# Patient Record
Sex: Male | Born: 1955 | Race: Black or African American | Hispanic: No | Marital: Single | Smoking: Never smoker
Health system: Southern US, Community
[De-identification: ages and names within clinical notes are randomized; demographics above are authoritative.]

## PROBLEM LIST (undated history)

## (undated) DIAGNOSIS — I10 Essential (primary) hypertension: Secondary | ICD-10-CM

## (undated) DIAGNOSIS — I480 Paroxysmal atrial fibrillation: Secondary | ICD-10-CM

## (undated) DIAGNOSIS — C9590 Leukemia, unspecified not having achieved remission: Secondary | ICD-10-CM

---

## 2018-04-27 ENCOUNTER — Emergency Department (HOSPITAL_BASED_OUTPATIENT_CLINIC_OR_DEPARTMENT_OTHER): Payer: BLUE CROSS/BLUE SHIELD

## 2018-04-27 ENCOUNTER — Inpatient Hospital Stay (HOSPITAL_BASED_OUTPATIENT_CLINIC_OR_DEPARTMENT_OTHER)
Admission: EM | Admit: 2018-04-27 | Discharge: 2018-04-29 | DRG: 291 | Disposition: A | Discharge status: Home / Self Care | Payer: BLUE CROSS/BLUE SHIELD | Attending: Internal Medicine | Admitting: Internal Medicine

## 2018-04-27 ENCOUNTER — Other Ambulatory Visit: Payer: Self-pay

## 2018-04-27 ENCOUNTER — Encounter (HOSPITAL_BASED_OUTPATIENT_CLINIC_OR_DEPARTMENT_OTHER): Payer: Self-pay | Admitting: *Deleted

## 2018-04-27 DIAGNOSIS — C9201 Acute myeloblastic leukemia, in remission: Secondary | ICD-10-CM | POA: Diagnosis present

## 2018-04-27 DIAGNOSIS — I480 Paroxysmal atrial fibrillation: Secondary | ICD-10-CM | POA: Diagnosis present

## 2018-04-27 DIAGNOSIS — I427 Cardiomyopathy due to drug and external agent: Secondary | ICD-10-CM | POA: Diagnosis present

## 2018-04-27 DIAGNOSIS — I5021 Acute systolic (congestive) heart failure: Secondary | ICD-10-CM | POA: Diagnosis present

## 2018-04-27 DIAGNOSIS — I471 Supraventricular tachycardia: Secondary | ICD-10-CM | POA: Diagnosis not present

## 2018-04-27 DIAGNOSIS — I110 Hypertensive heart disease with heart failure: Principal | ICD-10-CM | POA: Diagnosis present

## 2018-04-27 DIAGNOSIS — I340 Nonrheumatic mitral (valve) insufficiency: Secondary | ICD-10-CM | POA: Diagnosis not present

## 2018-04-27 DIAGNOSIS — Z8249 Family history of ischemic heart disease and other diseases of the circulatory system: Secondary | ICD-10-CM

## 2018-04-27 DIAGNOSIS — T451X5A Adverse effect of antineoplastic and immunosuppressive drugs, initial encounter: Secondary | ICD-10-CM | POA: Diagnosis present

## 2018-04-27 DIAGNOSIS — T502X5A Adverse effect of carbonic-anhydrase inhibitors, benzothiadiazides and other diuretics, initial encounter: Secondary | ICD-10-CM | POA: Diagnosis present

## 2018-04-27 DIAGNOSIS — I5022 Chronic systolic (congestive) heart failure: Secondary | ICD-10-CM

## 2018-04-27 DIAGNOSIS — J181 Lobar pneumonia, unspecified organism: Secondary | ICD-10-CM | POA: Diagnosis present

## 2018-04-27 DIAGNOSIS — I509 Heart failure, unspecified: Secondary | ICD-10-CM

## 2018-04-27 DIAGNOSIS — I491 Atrial premature depolarization: Secondary | ICD-10-CM | POA: Diagnosis present

## 2018-04-27 DIAGNOSIS — I5023 Acute on chronic systolic (congestive) heart failure: Secondary | ICD-10-CM | POA: Diagnosis present

## 2018-04-27 DIAGNOSIS — C9200 Acute myeloblastic leukemia, not having achieved remission: Secondary | ICD-10-CM | POA: Diagnosis present

## 2018-04-27 DIAGNOSIS — Z79899 Other long term (current) drug therapy: Secondary | ICD-10-CM | POA: Diagnosis not present

## 2018-04-27 DIAGNOSIS — J189 Pneumonia, unspecified organism: Secondary | ICD-10-CM

## 2018-04-27 DIAGNOSIS — E876 Hypokalemia: Secondary | ICD-10-CM | POA: Diagnosis present

## 2018-04-27 HISTORY — DX: Essential (primary) hypertension: I10

## 2018-04-27 HISTORY — DX: Paroxysmal atrial fibrillation: I48.0

## 2018-04-27 HISTORY — DX: Leukemia, unspecified not having achieved remission: C95.90

## 2018-04-27 LAB — URINALYSIS, ROUTINE W REFLEX MICROSCOPIC
Bilirubin Urine: NEGATIVE
GLUCOSE, UA: NEGATIVE mg/dL
KETONES UR: NEGATIVE mg/dL
LEUKOCYTES UA: NEGATIVE
Nitrite: NEGATIVE
Protein, ur: NEGATIVE mg/dL
Specific Gravity, Urine: 1.015 (ref 1.005–1.030)
pH: 6.5 (ref 5.0–8.0)

## 2018-04-27 LAB — URINALYSIS, MICROSCOPIC (REFLEX): WBC, UA: NONE SEEN WBC/hpf (ref 0–5)

## 2018-04-27 LAB — CBC WITH DIFFERENTIAL/PLATELET
BASOS ABS: 0 10*3/uL (ref 0.0–0.1)
Basophils Relative: 0 %
EOS ABS: 0.1 10*3/uL (ref 0.0–0.7)
EOS PCT: 2 %
HCT: 30.3 % — ABNORMAL LOW (ref 39.0–52.0)
Hemoglobin: 10.1 g/dL — ABNORMAL LOW (ref 13.0–17.0)
LYMPHS PCT: 23 %
Lymphs Abs: 1.2 10*3/uL (ref 0.7–4.0)
MCH: 30.7 pg (ref 26.0–34.0)
MCHC: 33.3 g/dL (ref 30.0–36.0)
MCV: 92.1 fL (ref 78.0–100.0)
MONO ABS: 0.8 10*3/uL (ref 0.1–1.0)
Monocytes Relative: 14 %
Neutro Abs: 3.2 10*3/uL (ref 1.7–7.7)
Neutrophils Relative %: 61 %
Platelets: 179 10*3/uL (ref 150–400)
RBC: 3.29 MIL/uL — AB (ref 4.22–5.81)
RDW: 17.4 % — AB (ref 11.5–15.5)
WBC: 5.2 10*3/uL (ref 4.0–10.5)

## 2018-04-27 LAB — COMPREHENSIVE METABOLIC PANEL
ALK PHOS: 95 U/L (ref 38–126)
ALT: 20 U/L (ref 17–63)
ANION GAP: 11 (ref 5–15)
AST: 21 U/L (ref 15–41)
Albumin: 3.5 g/dL (ref 3.5–5.0)
BILIRUBIN TOTAL: 1.2 mg/dL (ref 0.3–1.2)
BUN: 15 mg/dL (ref 6–20)
CALCIUM: 8.4 mg/dL — AB (ref 8.9–10.3)
CO2: 26 mmol/L (ref 22–32)
Chloride: 102 mmol/L (ref 101–111)
Creatinine, Ser: 0.91 mg/dL (ref 0.61–1.24)
Glucose, Bld: 101 mg/dL — ABNORMAL HIGH (ref 65–99)
POTASSIUM: 2.8 mmol/L — AB (ref 3.5–5.1)
Sodium: 139 mmol/L (ref 135–145)
TOTAL PROTEIN: 7.1 g/dL (ref 6.5–8.1)

## 2018-04-27 LAB — I-STAT CG4 LACTIC ACID, ED: Lactic Acid, Venous: 2.03 mmol/L (ref 0.5–1.9)

## 2018-04-27 LAB — TROPONIN I: TROPONIN I: 0.05 ng/mL — AB (ref <0.03)

## 2018-04-27 LAB — D-DIMER, QUANTITATIVE (NOT AT ARMC): D DIMER QUANT: 2.86 ug{FEU}/mL — AB (ref 0.00–0.50)

## 2018-04-27 LAB — BRAIN NATRIURETIC PEPTIDE: B Natriuretic Peptide: 1976.9 pg/mL — ABNORMAL HIGH (ref 0.0–100.0)

## 2018-04-27 LAB — LIPASE, BLOOD: LIPASE: 24 U/L (ref 11–51)

## 2018-04-27 MED ORDER — MAGNESIUM OXIDE 400 (241.3 MG) MG PO TABS
400.0000 mg | ORAL_TABLET | Freq: Three times a day (TID) | ORAL | Status: DC
  Administered 2018-04-27 – 2018-04-29 (×5): 400 mg via ORAL
  Filled 2018-04-27 (×5): qty 1

## 2018-04-27 MED ORDER — VANCOMYCIN HCL IN DEXTROSE 750-5 MG/150ML-% IV SOLN
750.0000 mg | Freq: Three times a day (TID) | INTRAVENOUS | Status: DC
  Administered 2018-04-27 – 2018-04-28 (×2): 750 mg via INTRAVENOUS
  Filled 2018-04-27 (×3): qty 150

## 2018-04-27 MED ORDER — POTASSIUM CHLORIDE CRYS ER 20 MEQ PO TBCR
40.0000 meq | EXTENDED_RELEASE_TABLET | Freq: Once | ORAL | Status: AC
  Administered 2018-04-27: 40 meq via ORAL
  Filled 2018-04-27: qty 2

## 2018-04-27 MED ORDER — ONDANSETRON HCL 4 MG/2ML IJ SOLN: 4.0000 mg | Freq: Four times a day (QID) | INTRAMUSCULAR | Status: DC | PRN

## 2018-04-27 MED ORDER — LISINOPRIL 5 MG PO TABS: 5.0000 mg | ORAL_TABLET | Freq: Every day | ORAL | Status: DC

## 2018-04-27 MED ORDER — IOPAMIDOL (ISOVUE-370) INJECTION 76%
100.0000 mL | Freq: Once | INTRAVENOUS | Status: AC | PRN
  Administered 2018-04-27: 100 mL via INTRAVENOUS

## 2018-04-27 MED ORDER — ONDANSETRON HCL 4 MG PO TABS: 4.0000 mg | ORAL_TABLET | Freq: Four times a day (QID) | ORAL | Status: DC | PRN

## 2018-04-27 MED ORDER — POTASSIUM CHLORIDE 20 MEQ/15ML (10%) PO SOLN
60.0000 meq | Freq: Once | ORAL | Status: AC
  Administered 2018-04-27: 60 meq via ORAL
  Filled 2018-04-27: qty 45

## 2018-04-27 MED ORDER — DRONABINOL 2.5 MG PO CAPS
2.5000 mg | ORAL_CAPSULE | Freq: Three times a day (TID) | ORAL | Status: DC
  Administered 2018-04-28 – 2018-04-29 (×4): 2.5 mg via ORAL
  Filled 2018-04-27 (×4): qty 1

## 2018-04-27 MED ORDER — FLUCONAZOLE 100 MG PO TABS
200.0000 mg | ORAL_TABLET | Freq: Every day | ORAL | Status: DC
  Administered 2018-04-28 – 2018-04-29 (×2): 200 mg via ORAL
  Filled 2018-04-27 (×2): qty 2

## 2018-04-27 MED ORDER — POTASSIUM CHLORIDE CRYS ER 20 MEQ PO TBCR: 30.0000 meq | EXTENDED_RELEASE_TABLET | Freq: Three times a day (TID) | ORAL | Status: DC

## 2018-04-27 MED ORDER — SODIUM CHLORIDE 0.9 % IV SOLN
2.0000 g | Freq: Once | INTRAVENOUS | Status: AC
  Administered 2018-04-27: 2 g via INTRAVENOUS

## 2018-04-27 MED ORDER — FAMOTIDINE 20 MG PO TABS
20.0000 mg | ORAL_TABLET | Freq: Two times a day (BID) | ORAL | Status: DC
  Administered 2018-04-27 – 2018-04-29 (×4): 20 mg via ORAL
  Filled 2018-04-27 (×4): qty 1

## 2018-04-27 MED ORDER — SODIUM CHLORIDE 0.9 % IV SOLN
2.0000 g | Freq: Two times a day (BID) | INTRAVENOUS | Status: DC
  Administered 2018-04-28 – 2018-04-29 (×3): 2 g via INTRAVENOUS
  Filled 2018-04-27 (×4): qty 2

## 2018-04-27 MED ORDER — ACETAMINOPHEN 650 MG RE SUPP: 650.0000 mg | Freq: Four times a day (QID) | RECTAL | Status: DC | PRN

## 2018-04-27 MED ORDER — FUROSEMIDE 10 MG/ML IJ SOLN
60.0000 mg | Freq: Once | INTRAMUSCULAR | Status: AC
  Administered 2018-04-27: 60 mg via INTRAVENOUS
  Filled 2018-04-27: qty 6

## 2018-04-27 MED ORDER — CEFEPIME HCL 2 G IJ SOLR
INTRAMUSCULAR | Status: AC
  Filled 2018-04-27: qty 2

## 2018-04-27 MED ORDER — FUROSEMIDE 10 MG/ML IJ SOLN
40.0000 mg | Freq: Two times a day (BID) | INTRAMUSCULAR | Status: DC
  Administered 2018-04-28 – 2018-04-29 (×3): 40 mg via INTRAVENOUS
  Filled 2018-04-27 (×3): qty 4

## 2018-04-27 MED ORDER — VANCOMYCIN HCL IN DEXTROSE 1-5 GM/200ML-% IV SOLN
1000.0000 mg | Freq: Once | INTRAVENOUS | Status: AC
  Administered 2018-04-27: 1000 mg via INTRAVENOUS
  Filled 2018-04-27: qty 200

## 2018-04-27 MED ORDER — ACETAMINOPHEN 325 MG PO TABS: 650.0000 mg | ORAL_TABLET | Freq: Four times a day (QID) | ORAL | Status: DC | PRN

## 2018-04-27 MED ORDER — ENOXAPARIN SODIUM 40 MG/0.4ML ~~LOC~~ SOLN
40.0000 mg | Freq: Every day | SUBCUTANEOUS | Status: DC
  Administered 2018-04-27 – 2018-04-28 (×2): 40 mg via SUBCUTANEOUS
  Filled 2018-04-27 (×2): qty 0.4

## 2018-04-27 NOTE — ED Notes (Signed)
Patiwent c/o shortness of breath, O2 sat 90-94.  O2 [place] started at 2 lpm.

## 2018-04-27 NOTE — Progress Notes (Signed)
  Call from Dr. 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  On presentation the patient is in sinus tachycardia.  No hypoxia.  CTA revealed pleural effusion with right lower lobe infiltrates.  Elevated BNP of 1900.  To note patient's cardiologist is in [facility] and oncologist at [facility].  No beds currently available at [facility].  We will admit the patient to telemetry unit at [facility] as inpatient status.

## 2018-04-27 NOTE — H&P (Addendum)
History and Physical    Brendan Ellis PYK:[id number] DOB: 12-29-1955 DOA: 04/27/2018  PCP: Robert Bellow, PA-C  Patient coming from: Home.  Chief Complaint: Shortness of breath.  HPI: Brendan Ellis is a 62 y.o. male with history of acute leukemia in remission had induction therapy in [place] around last December 2018 and is due to get consolidation therapy next week has chemotherapy induced cardiomyopathy with last EF measured in March 2019 was 25 to 30% with severe global hypokinesia presents to the ER with complaints of worsening shortness of breath over the last 3 days.  Patient has known history of cardiomyopathy and is on Lasix spironolactone Coreg and lisinopril.  Patient's diuretics and coreg were discontinued 2 weeks ago due to low normal blood pressure.  Patient also had recent cholecystostomy tube removed after having been diagnosed with cholecystitis.  Over the last 3 days patient's shortness of breath worsened Lasix was restarted.  Patient also has experienced productive cough.  Denies any chest pain.  Shortness of breath is on exertion and lying flat.  Also noticed some swelling in the lower extremity's bilaterally.  ED Course: In the ER at [facility] patient had labs drawn which showed BNP of 1900 with mildly elevated troponin.  EKG was showing sinus tachycardia with PVCs and LVH.  Patient has known history of paroxysmal atrial fibrillation.  Since d-dimer was elevated CT angiogram was done which showed bilateral pleural effusion and possible infiltrates.  Patient was started on empiric antibiotics for possible pneumonia and Lasix 40 mg IV for CHF.  Patient also has severe hypokalemia.  Review of Systems: As per HPI, rest all negative.   Past Medical History:  Diagnosis Date  . Hypertension   . Leukemia ([place])     History reviewed. No pertinent surgical history.   reports that he has never smoked. He has never used smokeless tobacco. He reports that he does not  drink alcohol or use drugs.  No Known Allergies  Family History  Problem Relation Age of Onset  . Hypertension Mother   . Diabetes Mother     Prior to Admission medications   Medication Sig Start Date End Date Taking? Authorizing Provider  acetaminophen (TYLENOL) 500 MG tablet Take 500-1,000 mg by mouth every 6 (six) hours as needed for moderate pain.   Yes [provider]  carvedilol (COREG) 3.125 MG tablet Take 3.125 mg by mouth 2 (two) times daily with a meal. (BP > 120 Take 2 tablets)   Yes [provider]  dronabinol (MARINOL) 2.5 MG capsule Take 2.5 mg by mouth 3 (three) times daily.   Yes [provider]  famotidine (PEPCID) 20 MG tablet Take 20 mg by mouth 2 (two) times daily.    Yes [provider]  furosemide (LASIX) 40 MG tablet Take 40 mg by mouth daily.    Yes [provider]  lisinopril (PRINIVIL,ZESTRIL) 10 MG tablet Take 10 mg by mouth daily as needed (BP >130).    Yes [provider]  magnesium oxide (MAG-OX) 400 MG tablet Take 400 mg by mouth 3 (three) times daily.   Yes [provider]  potassium chloride SA (KLOR-CON M15) 15 MEQ tablet Take 30 mEq by mouth 3 (three) times daily.   Yes [provider]  fluconazole (DIFLUCAN) 200 MG tablet Take 200 mg by mouth.  04/03/18   [provider]  spironolactone (ALDACTONE) 25 MG tablet Take 25 mg by mouth daily as needed (fluid retention).  03/26/18  [provider]    Physical Exam: Vitals:   04/27/18 1800 04/27/18 1830 04/27/18 2040 04/27/18 2226  BP: (!) 125/112 (!) 133/95 (!) 142/109 114/90  Pulse:  99 (!) 53 63  Resp: (!) 31 18 20 20   Temp:   97.9 F (36.6 C)   TempSrc:   Oral   SpO2:  100% 100% 100%  Weight:      Height:          Constitutional: Moderately built and nourished. Vitals:   04/27/18 1800 04/27/18 1830 04/27/18 2040 04/27/18 2226  BP: (!) 125/112 (!) 133/95 (!) 142/109 114/90  Pulse:  99 (!) 53 63  Resp:  (!) 31 18 20 20   Temp:   97.9 F (36.6 C)   TempSrc:   Oral   SpO2:  100% 100% 100%  Weight:      Height:       Eyes: Anicteric no pallor. ENMT: No discharge from the ears eyes nose or mouth. Neck: JVD elevated no mass felt. Respiratory: No rhonchi mild crepitations. Cardiovascular: S1-S2 heard no murmurs appreciated. Abdomen: Soft nontender bowel sounds present. Musculoskeletal: Mild edema of the both lower extremities. Skin: No rash. Neurologic: Alert awake oriented to time place and person.  Moves all extremities. Psychiatric: Appears normal.  Normal affect.   Labs on Admission: I have personally reviewed following labs and imaging studies  CBC: Recent Labs  Lab 04/27/18 1510  WBC 5.2  NEUTROABS 3.2  HGB 10.1*  HCT 30.3*  MCV 92.1  PLT 563   Basic Metabolic Panel: Recent Labs  Lab 04/27/18 1510  NA 139  K 2.8*  CL 102  CO2 26  GLUCOSE 101*  BUN 15  CREATININE 0.91  CALCIUM 8.4*   GFR: Estimated Creatinine Clearance: 92.6 mL/min (by C-G formula based on SCr of 0.91 mg/dL). Liver Function Tests: Recent Labs  Lab 04/27/18 1510  AST 21  ALT 20  ALKPHOS 95  BILITOT 1.2  PROT 7.1  ALBUMIN 3.5   Recent Labs  Lab 04/27/18 1510  LIPASE 24   No results for input(s): AMMONIA in the last 168 hours. Coagulation Profile: No results for input(s): INR, PROTIME in the last 168 hours. Cardiac Enzymes: Recent Labs  Lab 04/27/18 1510  TROPONINI 0.05*   BNP (last 3 results) No results for input(s): PROBNP in the last 8760 hours. HbA1C: No results for input(s): HGBA1C in the last 72 hours. CBG: No results for input(s): GLUCAP in the last 168 hours. Lipid Profile: No results for input(s): CHOL, HDL, LDLCALC, TRIG, CHOLHDL, LDLDIRECT in the last 72 hours. Thyroid Function Tests: No results for input(s): TSH, T4TOTAL, FREET4, T3FREE, THYROIDAB in the last 72 hours. Anemia Panel: No results for input(s): VITAMINB12, FOLATE, FERRITIN, TIBC, IRON, RETICCTPCT  in the last 72 hours. Urine analysis:    Component Value Date/Time   COLORURINE YELLOW 04/27/2018 1552   APPEARANCEUR CLEAR 04/27/2018 1552   LABSPEC 1.015 04/27/2018 1552   PHURINE 6.5 04/27/2018 1552   GLUCOSEU NEGATIVE 04/27/2018 1552   HGBUR SMALL (A) 04/27/2018 1552   BILIRUBINUR NEGATIVE 04/27/2018 1552   KETONESUR NEGATIVE 04/27/2018 1552   PROTEINUR NEGATIVE 04/27/2018 1552   NITRITE NEGATIVE 04/27/2018 1552   LEUKOCYTESUR NEGATIVE 04/27/2018 1552   Sepsis Labs: @LABRCNTIP (procalcitonin:4,lacticidven:4) )No results found for this or any previous visit (from the past 240 hour(s)).   Radiological Exams on Admission: Dg Chest 2 View  Result Date: 04/27/2018 CLINICAL DATA:  Cough and congestion with shortness of breath. History of leukemia EXAM:  CHEST - 2 VIEW COMPARISON:  None. FINDINGS: There is airspace consolidation in the left lower lobe with small left pleural effusion. There is more hazy opacity throughout portions of the right middle and lower lobes. Suspect mild loculated effusion on the right. There is cardiomegaly with pulmonary vascularity normal. No adenopathy. No evident bone lesions. IMPRESSION: Multifocal pneumonia. Small left pleural effusion. There may be mild loculated effusion on the right. There is cardiomegaly.  No adenopathy evident. Electronically Signed   By: Lowella Grip III M.D.   On: 04/27/2018 15:30   Ct Angio Chest Pe W And/or Wo Contrast  Result Date: 04/27/2018 CLINICAL DATA:  History of leukemia with shortness of breath, initial encounter EXAM: CT ANGIOGRAPHY CHEST WITH CONTRAST TECHNIQUE: Multidetector CT imaging of the chest was performed using the standard protocol during bolus administration of intravenous contrast. Multiplanar CT image reconstructions and MIPs were obtained to evaluate the vascular anatomy. CONTRAST:  163mL ISOVUE-370 IOPAMIDOL (ISOVUE-370) INJECTION 76% COMPARISON:  Plain film from earlier in the same day. FINDINGS:  Cardiovascular: Thoracic aorta demonstrates atherosclerotic calcifications without aneurysmal dilatation or dissection. The pulmonary artery shows a normal branching pattern. No filling defects to suggest pulmonary embolism are seen. Mild cardiomegaly is noted. Mild coronary calcifications are seen. Mediastinum/Nodes: Thoracic inlet is within normal limits. No significant hilar or mediastinal adenopathy is noted. The esophagus as visualized is within normal limits. Lungs/Pleura: Bilateral pleural effusions are noted left slightly greater than right. Some associated right lower lobe infiltrate is noted. Fluid is also noted within the major fissure. No focal nodularity is noted. Upper Abdomen: Within normal limits. Musculoskeletal: Within normal limits. Review of the MIP images confirms the above findings. IMPRESSION: No evidence of pulmonary emboli. Bilateral pleural effusions are noted left greater than right with evidence of right lower lobe infiltrate. Aortic Atherosclerosis (ICD10-I70.0). Electronically Signed   By: Inez Catalina M.D.   On: 04/27/2018 17:11    EKG: Independently reviewed.  Sinus tachycardia with PVCs and LVH.  Assessment/Plan Principal Problem:   Acute on chronic systolic congestive heart failure (HCC) Active Problems:   Acute exacerbation of CHF (congestive heart failure) ([place])   Community acquired pneumonia of right lower lobe of lung (HCC)   AML (acute myeloblastic leukemia) (HCC)   PAF (paroxysmal atrial fibrillation) (HCC)   Acute systolic CHF (congestive heart failure) ([place])    1. Acute on chronic systolic heart failure last EF measured was 25 to 30% with global hypokinesia last March 3 months ago -patient is on Lasix 40 mg IV every 12.  I have decreased patient's lisinopril dose from 10-5 to accommodate for addition of Coreg eventually once patient is euvolemic.  Closely follow intake output metabolic panel daily weights cycle cardiac markers. 2. Possible pneumonia on  vancomycin and cefepime.  Follow procalcitonin levels.  Sputum cultures if patient is able to produce. 3. Paroxysmal atrial fibrillation -during the stay patient has paroxysmal episodes of A. fib with RVR and goes back to sinus tachycardia.  Closely monitor in telemetry.  May need to start Coreg.  Not sure why patient was not started on any anticoagulation.  Probably will require but may need to contact patient's cardiologist in the morning.  Check TSH we will keep patient on aspirin. 4. Normocytic normochromic anemia likely related to patient's AML. 5. History of AML consolidation therapy to be started next week. 6. During the month of February 2019 patient was briefly on dialysis.   DVT prophylaxis: Lovenox. Code Status: Full code. Family Communication: Patient's daughter.  Disposition Plan: Home. Consults called: None. Admission status: Inpatient.   Rise Patience MD [facility] Pager [phone].  If 7PM-7AM, please contact night-coverage [email or web address] Password [facility]  04/27/2018, 10:29 PM

## 2018-04-27 NOTE — ED Triage Notes (Signed)
Palpitations and SOB. Hx of leukemia.

## 2018-04-27 NOTE — ED Notes (Signed)
Pt transported to CT at this time.

## 2018-04-27 NOTE — ED Notes (Signed)
Pt will require AC IV access of 20G or higher for scheduled CTA of chest per radiology guidelines

## 2018-04-27 NOTE — ED Notes (Signed)
ED Provider at bedside. 

## 2018-04-27 NOTE — ED Notes (Signed)
Pt unable to void at this time. 

## 2018-04-27 NOTE — Progress Notes (Addendum)
Pharmacy Antibiotic Note  Brendan Ellis is a 62 y.o. male admitted on 04/27/2018 with pneumonia.  Pharmacy has been consulted for cefepime and vancomycin dosing.  Plan: - cefepime 2 G every 8 hours - vancomycin 1 G load x 1 - vancomycin 750 mg every 8 hours - goal trough 15-20 mcg/mL - monitor clinical progression, length of therapy, renal function, and vancomycin trough as needed  Height: 6\' 2"  (188 cm) Weight: 167 lb (75.8 kg) IBW/kg (Calculated) : 82.2  Temp (24hrs), Avg:98 F (36.7 C), Min:98 F (36.7 C), Max:98 F (36.7 C)  Recent Labs  Lab 04/27/18 1510  WBC 5.2  CREATININE 0.91    Estimated Creatinine Clearance: 92.6 mL/min (by C-G formula based on SCr of 0.91 mg/dL).    No Known Allergies  Antimicrobials this admission: Cefepime 5/31>>  Vancomycin 5/31 >>   Dose adjustments this admission: N/A  Microbiology results: 5/31 BCx: pending   Thank you for allowing pharmacy to be a part of this patient's care.  Deboraha Sprang, PharmD 04/27/2018 3:48 PM

## 2018-04-27 NOTE — ED Provider Notes (Signed)
Emergency Department Provider Note   I have reviewed the triage vital signs and the nursing notes.   HISTORY  Chief Complaint Palpitations   HPI Brendan Ellis is a 62 y.o. male with PMH of AML s/p induction chemo with remission currently on Azacitidine, Afib, chemotherapy induced cardiomyopathy resents to the emergency department for evaluation of heart palpitations and difficulty breathing.  Symptoms have been worsening over the past several days.  The patient denies any fevers or shaking chills.  He has had a mild cough with no significant productive component.  He denies chest pain has felt a fluttering in his chest.  Patient does have history of atrial fibrillation but is not anticoagulated.  He recently had a early cystostomy tube removed by his general surgeon after treatment has been completed.  He denies any worsening abdominal pain, fevers, drainage from the wound.  He had been on Lasix as an outpatient but this was discontinued for several weeks because of hypotension.  When his breathing symptoms worsened, the decision was made to restart Lasix which she has been taking for the past 3 days.  He does not feel like his legs are more swollen than normal and actually says they may have gone down somewhat.   Past Medical History:  Diagnosis Date  . Hypertension   . Leukemia [facility])     Patient Active Problem List   Diagnosis Date Noted  . Acute exacerbation of CHF (congestive heart failure) ([place]) 04/27/2018  . Acute on chronic systolic congestive heart failure ([place]) 04/27/2018  . Community acquired pneumonia of right lower lobe of lung ([place]) 04/27/2018  . AML (acute myeloblastic leukemia) ([place]) 04/27/2018  . PAF (paroxysmal atrial fibrillation) ([place]) 04/27/2018  . Acute systolic CHF (congestive heart failure) ([place]) 04/27/2018    History reviewed. No pertinent surgical history.    Allergies Patient has no known allergies.  Family History  Problem Relation Age of Onset    . Hypertension Mother   . Diabetes Mother     Social History Social History   Tobacco Use  . Smoking status: Never Smoker  . Smokeless tobacco: Never Used  Substance Use Topics  . Alcohol use: Never    Frequency: Never  . Drug use: Never    Review of Systems  Constitutional: No fever/chills Eyes: No visual changes. ENT: No sore throat. Cardiovascular: Denies chest pain. Positive heart palpitations.  Respiratory: Positive shortness of breath. Gastrointestinal: No abdominal pain.  No nausea, no vomiting.  No diarrhea.  No constipation. Genitourinary: Negative for dysuria. Musculoskeletal: Negative for back pain. Skin: Negative for rash. Neurological: Negative for headaches, focal weakness or numbness.  10-point ROS otherwise negative.  ____________________________________________   PHYSICAL EXAM:  VITAL SIGNS: ED Triage Vitals  Enc Vitals Group     BP 04/27/18 1440 (!) 126/99     Pulse Rate 04/27/18 1440 99     Resp 04/27/18 1440 (!) 28     Temp 04/27/18 1440 98 F (36.7 C)     Temp Source 04/27/18 1440 Oral     SpO2 04/27/18 1440 99 %     Weight 04/27/18 1438 167 lb (75.8 kg)     Height 04/27/18 1438 6\' 2"  (1.88 m)     Pain Score 04/27/18 1437 0   Constitutional: Alert and oriented. Well appearing and in no acute distress. Eyes: Conjunctivae are normal.  Head: Atraumatic. Nose: No congestion/rhinnorhea. Mouth/Throat: Mucous membranes are slightly dry.  Neck: No stridor.   Cardiovascular: Tachycardia. Good peripheral circulation. Grossly  normal heart sounds.   Respiratory: Slight increased respiratory effort.  No retractions. Lungs CTAB. Gastrointestinal: Soft and nontender. No distention.  Musculoskeletal: No lower extremity tenderness nor edema. No gross deformities of extremities. Neurologic:  Normal speech and language. No gross focal neurologic deficits are appreciated.  Skin:  Skin is warm, dry and intact. No rash  noted.  ____________________________________________   LABS (all labs ordered are listed, but only abnormal results are displayed)  Labs Reviewed  COMPREHENSIVE METABOLIC PANEL - Abnormal; Notable for the following components:      Result Value   Potassium 2.8 (*)    Glucose, Bld 101 (*)    Calcium 8.4 (*)    All other components within normal limits  BRAIN NATRIURETIC PEPTIDE - Abnormal; Notable for the following components:   B Natriuretic Peptide 1,976.9 (*)    All other components within normal limits  TROPONIN I - Abnormal; Notable for the following components:   Troponin I 0.05 (*)    All other components within normal limits  CBC WITH DIFFERENTIAL/PLATELET - Abnormal; Notable for the following components:   RBC 3.29 (*)    Hemoglobin 10.1 (*)    HCT 30.3 (*)    RDW 17.4 (*)    All other components within normal limits  URINALYSIS, ROUTINE W REFLEX MICROSCOPIC - Abnormal; Notable for the following components:   Hgb urine dipstick SMALL (*)    All other components within normal limits  D-DIMER, QUANTITATIVE (NOT AT [facility]) - Abnormal; Notable for the following components:   D-Dimer, Quant 2.86 (*)    All other components within normal limits  URINALYSIS, MICROSCOPIC (REFLEX) - Abnormal; Notable for the following components:   Bacteria, UA RARE (*)    All other components within normal limits  BASIC METABOLIC PANEL - Abnormal; Notable for the following components:   Potassium 3.1 (*)    Glucose, Bld 119 (*)    Calcium 8.7 (*)    All other components within normal limits  CBC WITH DIFFERENTIAL/PLATELET - Abnormal; Notable for the following components:   RBC 3.36 (*)    Hemoglobin 10.2 (*)    HCT 31.7 (*)    RDW 18.5 (*)    All other components within normal limits  MAGNESIUM - Abnormal; Notable for the following components:   Magnesium 1.3 (*)    All other components within normal limits  TROPONIN I - Abnormal; Notable for the following components:   Troponin I 0.05  (*)    All other components within normal limits  I-STAT CG4 LACTIC ACID, ED - Abnormal; Notable for the following components:   Lactic Acid, Venous 2.03 (*)    All other components within normal limits  CULTURE, BLOOD (ROUTINE X 2)  CULTURE, BLOOD (ROUTINE X 2)  CULTURE, EXPECTORATED SPUTUM-ASSESSMENT  MRSA PCR SCREENING  LIPASE, BLOOD  TSH  PROCALCITONIN  HIV ANTIBODY (ROUTINE TESTING)  TROPONIN I   ____________________________________________  EKG   EKG Interpretation  Date/Time:  Friday Apr 27 2018 14:40:46 EDT Ventricular Rate:  122 PR Interval:  186 QRS Duration: 96 QT Interval:  350 QTC Calculation: 498 R Axis:   82 Text Interpretation:  Sinus tachycardia with occasional and consecutive Premature ventricular complexes Left ventricular hypertrophy with repolarization abnormality Abnormal ECG No STEMI. No prior for comparison.  Confirmed by Nanda Quinton [phone]) on 04/27/2018 3:01:16 PM       ____________________________________________  RADIOLOGY  Dg Chest 2 View  Result Date: 04/27/2018 CLINICAL DATA:  Cough and congestion with shortness of  breath. History of leukemia EXAM: CHEST - 2 VIEW COMPARISON:  None. FINDINGS: There is airspace consolidation in the left lower lobe with small left pleural effusion. There is more hazy opacity throughout portions of the right middle and lower lobes. Suspect mild loculated effusion on the right. There is cardiomegaly with pulmonary vascularity normal. No adenopathy. No evident bone lesions. IMPRESSION: Multifocal pneumonia. Small left pleural effusion. There may be mild loculated effusion on the right. There is cardiomegaly.  No adenopathy evident. Electronically Signed   By: Lowella Grip III M.D.   On: 04/27/2018 15:30   Ct Angio Chest Pe W And/or Wo Contrast  Result Date: 04/27/2018 CLINICAL DATA:  History of leukemia with shortness of breath, initial encounter EXAM: CT ANGIOGRAPHY CHEST WITH CONTRAST TECHNIQUE: Multidetector  CT imaging of the chest was performed using the standard protocol during bolus administration of intravenous contrast. Multiplanar CT image reconstructions and MIPs were obtained to evaluate the vascular anatomy. CONTRAST:  112mL ISOVUE-370 IOPAMIDOL (ISOVUE-370) INJECTION 76% COMPARISON:  Plain film from earlier in the same day. FINDINGS: Cardiovascular: Thoracic aorta demonstrates atherosclerotic calcifications without aneurysmal dilatation or dissection. The pulmonary artery shows a normal branching pattern. No filling defects to suggest pulmonary embolism are seen. Mild cardiomegaly is noted. Mild coronary calcifications are seen. Mediastinum/Nodes: Thoracic inlet is within normal limits. No significant hilar or mediastinal adenopathy is noted. The esophagus as visualized is within normal limits. Lungs/Pleura: Bilateral pleural effusions are noted left slightly greater than right. Some associated right lower lobe infiltrate is noted. Fluid is also noted within the major fissure. No focal nodularity is noted. Upper Abdomen: Within normal limits. Musculoskeletal: Within normal limits. Review of the MIP images confirms the above findings. IMPRESSION: No evidence of pulmonary emboli. Bilateral pleural effusions are noted left greater than right with evidence of right lower lobe infiltrate. Aortic Atherosclerosis (ICD10-I70.0). Electronically Signed   By: Inez Catalina M.D.   On: 04/27/2018 17:11    ____________________________________________   PROCEDURES  Procedure(s) performed:   .Critical Care Performed by: Margette Fast, MD Authorized by: Margette Fast, MD   Critical care provider statement:    Critical care time (minutes):  35   Critical care time was exclusive of:  Separately billable procedures and treating other patients and teaching time   Critical care was necessary to treat or prevent imminent or life-threatening deterioration of the following conditions:  Respiratory failure and cardiac  failure   Critical care was time spent personally by me on the following activities:  Blood draw for specimens, development of treatment plan with patient or surrogate, evaluation of patient's response to treatment, examination of patient, obtaining history from patient or surrogate, ordering and performing treatments and interventions, ordering and review of laboratory studies, pulse oximetry, ordering and review of radiographic studies, re-evaluation of patient's condition and review of old charts   I assumed direction of critical care for this patient from another provider in my specialty: no       ____________________________________________   INITIAL IMPRESSION / [place] / ED COURSE  Pertinent labs & imaging results that were available during my care of the patient were reviewed by me and considered in my medical decision making (see chart for details).  Emergency department for evaluation of difficulty breathing with associated tachycardia and palpitations.  The patient is currently in remission after being diagnosed with AML.  He is under the care of an oncologist at no font but most of his care including his cardiologist are  at [facility].  He is afebrile here but does have sinus tachycardia and some tachypnea.  Differential is broad and includes PE, infection, fluid overload although clinically patient has no findings to suggest fluid overload.  03:45 PM Patient's x-ray and initial blood work reviewed.  Some concern for possible multifocal pneumonia.  Will follow with CT angios especially in the setting of elevated d-dimer.   CTA negative for PE but shows pleural effusion and infiltrate. Continue diuresis and abx for now. Called [facility] who have no beds for transfer/admission. Will discuss with our hospitalist team. Patient ubdated who is in agreement with the plan.   Discussed patient's case with Hospitalist to request admission. Patient and family (if  present) updated with plan. Care transferred to Hospitalist service.  I reviewed all nursing notes, vitals, pertinent old records, EKGs, labs, imaging (as available).   ____________________________________________  FINAL CLINICAL IMPRESSION(S) / ED DIAGNOSES  Final diagnoses:  Community acquired pneumonia of right lower lobe of lung ([place])  Acute on chronic systolic congestive heart failure ([place])     MEDICATIONS GIVEN DURING THIS VISIT:  Medications  ceFEPIme (MAXIPIME) 2 g in sodium chloride 0.9 % 100 mL IVPB (0 g Intravenous Stopped 04/28/18 0552)  vancomycin (VANCOCIN) IVPB 750 mg/150 ml premix (750 mg Intravenous New Bag/Given 04/28/18 0952)  dronabinol (MARINOL) capsule 2.5 mg (2.5 mg Oral Given 04/28/18 0951)  famotidine (PEPCID) tablet 20 mg (20 mg Oral Given 04/28/18 0950)  fluconazole (DIFLUCAN) tablet 200 mg (200 mg Oral Given 04/28/18 0951)  magnesium oxide (MAG-OX) tablet 400 mg (400 mg Oral Given 04/28/18 0952)  acetaminophen (TYLENOL) tablet 650 mg (has no administration in time range)    Or  acetaminophen (TYLENOL) suppository 650 mg (has no administration in time range)  ondansetron (ZOFRAN) tablet 4 mg (has no administration in time range)    Or  ondansetron (ZOFRAN) injection 4 mg (has no administration in time range)  furosemide (LASIX) injection 40 mg (40 mg Intravenous Given 04/28/18 0522)  enoxaparin (LOVENOX) injection 40 mg (40 mg Subcutaneous Given 04/27/18 2320)  magnesium sulfate IVPB 4 g 100 mL (has no administration in time range)  potassium chloride SA (K-DUR,KLOR-CON) CR tablet 40 mEq (40 mEq Oral Given 04/28/18 0951)  lisinopril (PRINIVIL,ZESTRIL) tablet 2.5 mg (2.5 mg Oral Given 04/28/18 0951)  aspirin chewable tablet 81 mg (81 mg Oral Given 04/28/18 0951)  ceFEPIme (MAXIPIME) 2 g in sodium chloride 0.9 % 100 mL IVPB (0 g Intravenous Stopped 04/27/18 1654)  vancomycin (VANCOCIN) IVPB 1000 mg/200 mL premix (0 mg Intravenous Stopped 04/27/18 1726)  ceFEPIme (MAXIPIME) 2 g  injection (  Return to [facility] 04/27/18 1655)  iopamidol (ISOVUE-370) 76 % injection 100 mL (100 mLs Intravenous Contrast Given 04/27/18 1633)  furosemide (LASIX) injection 60 mg (60 mg Intravenous Given 04/27/18 1729)  potassium chloride 20 MEQ/15ML (10%) solution 60 mEq (60 mEq Oral Given 04/27/18 2111)  potassium chloride SA (K-DUR,KLOR-CON) CR tablet 40 mEq (40 mEq Oral Given 04/27/18 2318)    Note:  This document was prepared using Dragon voice recognition software and may include unintentional dictation errors.  Nanda Quinton, MD Emergency Medicine    Petrea Fredenburg, [facility], MD 04/28/18 1016

## 2018-04-27 NOTE — ED Notes (Signed)
Pt in radiology 

## 2018-04-27 NOTE — ED Notes (Signed)
Pt on cardiac monitor and auto VS 

## 2018-04-28 ENCOUNTER — Encounter (HOSPITAL_COMMUNITY): Payer: Self-pay | Admitting: Internal Medicine

## 2018-04-28 DIAGNOSIS — E876 Hypokalemia: Secondary | ICD-10-CM | POA: Diagnosis present

## 2018-04-28 DIAGNOSIS — J181 Lobar pneumonia, unspecified organism: Secondary | ICD-10-CM

## 2018-04-28 DIAGNOSIS — I5023 Acute on chronic systolic (congestive) heart failure: Secondary | ICD-10-CM

## 2018-04-28 DIAGNOSIS — C9201 Acute myeloblastic leukemia, in remission: Secondary | ICD-10-CM

## 2018-04-28 DIAGNOSIS — I491 Atrial premature depolarization: Secondary | ICD-10-CM | POA: Diagnosis present

## 2018-04-28 LAB — CBC WITH DIFFERENTIAL/PLATELET
BASOS PCT: 1 %
Basophils Absolute: 0 10*3/uL (ref 0.0–0.1)
EOS ABS: 0.1 10*3/uL (ref 0.0–0.7)
EOS PCT: 2 %
HCT: 31.7 % — ABNORMAL LOW (ref 39.0–52.0)
Hemoglobin: 10.2 g/dL — ABNORMAL LOW (ref 13.0–17.0)
LYMPHS ABS: 1.4 10*3/uL (ref 0.7–4.0)
Lymphocytes Relative: 21 %
MCH: 30.4 pg (ref 26.0–34.0)
MCHC: 32.2 g/dL (ref 30.0–36.0)
MCV: 94.3 fL (ref 78.0–100.0)
Monocytes Absolute: 0.9 10*3/uL (ref 0.1–1.0)
Monocytes Relative: 13 %
Neutro Abs: 4.1 10*3/uL (ref 1.7–7.7)
Neutrophils Relative %: 63 %
PLATELETS: 199 10*3/uL (ref 150–400)
RBC: 3.36 MIL/uL — ABNORMAL LOW (ref 4.22–5.81)
RDW: 18.5 % — ABNORMAL HIGH (ref 11.5–15.5)
WBC: 6.5 10*3/uL (ref 4.0–10.5)

## 2018-04-28 LAB — EXPECTORATED SPUTUM ASSESSMENT W GRAM STAIN, RFLX TO RESP C

## 2018-04-28 LAB — BASIC METABOLIC PANEL
ANION GAP: 11 (ref 5–15)
BUN: 16 mg/dL (ref 6–20)
CALCIUM: 8.7 mg/dL — AB (ref 8.9–10.3)
CO2: 28 mmol/L (ref 22–32)
CREATININE: 0.95 mg/dL (ref 0.61–1.24)
Chloride: 104 mmol/L (ref 101–111)
Glucose, Bld: 119 mg/dL — ABNORMAL HIGH (ref 65–99)
Potassium: 3.1 mmol/L — ABNORMAL LOW (ref 3.5–5.1)
SODIUM: 143 mmol/L (ref 135–145)

## 2018-04-28 LAB — TSH: TSH: 1.321 u[IU]/mL (ref 0.350–4.500)

## 2018-04-28 LAB — MRSA PCR SCREENING: MRSA BY PCR: NEGATIVE

## 2018-04-28 LAB — HIV ANTIBODY (ROUTINE TESTING W REFLEX): HIV Screen 4th Generation wRfx: NONREACTIVE

## 2018-04-28 LAB — PROCALCITONIN: Procalcitonin: 0.17 ng/mL

## 2018-04-28 LAB — TROPONIN I: Troponin I: 0.05 ng/mL (ref <0.03)

## 2018-04-28 LAB — MAGNESIUM: Magnesium: 1.3 mg/dL — ABNORMAL LOW (ref 1.7–2.4)

## 2018-04-28 MED ORDER — ASPIRIN 81 MG PO CHEW
81.0000 mg | CHEWABLE_TABLET | Freq: Every day | ORAL | Status: DC
  Administered 2018-04-28 – 2018-04-29 (×2): 81 mg via ORAL
  Filled 2018-04-28 (×2): qty 1

## 2018-04-28 MED ORDER — FUROSEMIDE 10 MG/ML IJ SOLN
60.0000 mg | Freq: Once | INTRAMUSCULAR | Status: AC
  Administered 2018-04-28: 60 mg via INTRAVENOUS

## 2018-04-28 MED ORDER — POTASSIUM CHLORIDE CRYS ER 20 MEQ PO TBCR
40.0000 meq | EXTENDED_RELEASE_TABLET | ORAL | Status: AC
  Administered 2018-04-28 (×2): 40 meq via ORAL
  Filled 2018-04-28 (×2): qty 2

## 2018-04-28 MED ORDER — LISINOPRIL 5 MG PO TABS
2.5000 mg | ORAL_TABLET | Freq: Every day | ORAL | Status: DC
  Administered 2018-04-28 – 2018-04-29 (×2): 2.5 mg via ORAL
  Filled 2018-04-28 (×2): qty 1

## 2018-04-28 MED ORDER — FUROSEMIDE 10 MG/ML IJ SOLN
INTRAMUSCULAR | Status: AC
  Filled 2018-04-28: qty 8

## 2018-04-28 MED ORDER — MAGNESIUM SULFATE 4 GM/100ML IV SOLN
4.0000 g | Freq: Once | INTRAVENOUS | Status: AC
  Administered 2018-04-28: 4 g via INTRAVENOUS
  Filled 2018-04-28: qty 100

## 2018-04-28 MED ORDER — VANCOMYCIN HCL IN DEXTROSE 750-5 MG/150ML-% IV SOLN
750.0000 mg | Freq: Two times a day (BID) | INTRAVENOUS | Status: DC
  Administered 2018-04-28 – 2018-04-29 (×2): 750 mg via INTRAVENOUS
  Filled 2018-04-28 (×2): qty 150

## 2018-04-28 MED ORDER — ASPIRIN 325 MG PO TABS
325.0000 mg | ORAL_TABLET | Freq: Every day | ORAL | Status: DC
Start: 2018-04-28 — End: 2018-04-28

## 2018-04-28 NOTE — Progress Notes (Signed)
PROGRESS NOTE    Brendan Ellis  IAX:[id number] DOB: [birth year] DOA: 04/27/2018 PCP: Robert Bellow, PA-C   Brief Narrative:  Patient 62 year old gentleman being followed by Dr. 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  Due to hypotension patient's diuretics and Coreg were held for about 2 weeks.  Patient presented with worsening shortness of breath despite resuming diuretics 3 days prior to admission.  Chest x-ray which was done consistent with pleural effusions and concern for infiltrate.  CT angiogram chest which was done negative for PE however showed bilateral pleural effusions and possible infiltrates.  Patient started empirically on IV antibiotics and IV diuretics.  Patient also noted to be severely hypokalemic.  Cardiology consulted.   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  Patient presented with worsening shortness of breath.  Patient's ACE inhibitor dose has been decreased to accommodate for diuretics and beta-blocker.  Patient with a urine output of 3.3 L over the past 24 hours.  Some clinical improvement however still with significant orthopnea per patient.  Cardiac enzymes with minimally elevated troponins which seem to have plateaued and likely secondary to acute CHF exacerbation.  2D echo has been ordered and is pending.  Due to patient's  significantly low ejection fraction and history of cardiomyopathy cardiology has been consulted.  Lisinopril dose has been further decreased to 2.5 mg daily to allow for adequate diuresis.  Patient's beta-blocker has been discontinued for now.  Cardiology following and appreciate input and recommendations.  2.  Probable community-acquired pneumonia versus HCAP Patient with a history of AML currently undergoing consolidation therapy to be started next week.  Chest x-ray and CT chest concerning for infiltrates.  Blood cultures have been ordered and are pending.  Sputum Gram stain and culture pending.  Check a MRSA PCR.  Continue empiric IV vancomycin IV cefepime.  If MRSA PCR is negative and continued improvement tomorrow will discontinue IV vancomycin and continue empiric IV cefepime.  Supportive care.  Follow.  3.  Hypokalemia/hypomagnesemia Likely secondary to diuretics.  Replete.  4.  Frequent PACs versus paroxysmal atrial fibrillation Initially was felt patient was going in and out of A. fib and sinus tachycardia.  Replete electrolytes.  Coreg on hold.  Patient has been seen in consultation by cardiology who has reviewed telemetry and feel patient does not have A. fib as he is noted to have P waves preceding each QRS complex on telemetry.  Per cardiology likely try to restart Coreg at a low dose hopefully tomorrow.  No need for anticoagulation at this time as per cardiology patient likely with PACs.  Follow.  5.  Elevated troponin Likely secondary to demand ischemia secondary to problem #1.  By cardiology not ACS.  2D echo ordered and pending.  6 history of AML Patient was supposed to start consolidative therapy next week.  Outpatient follow-up with oncology.  7 normocytic  anemia Likely secondary to AML.  No overt bleeding.  Follow H&H.  8: History of acute kidney injury It is noted per admitting MD that in February 2019 patient was briefly on hemodialysis.  Renal function currently stable.   Follow..   DVT prophylaxis: Lovenox Code Status: Full Family Communication: Updated patient.  No family at bedside. Disposition Plan: Home when medically improved and per cardiology.   Consultants:   Cardiology: Dr. Marlou Porch 04/27/2018  Procedures:   CT angiogram chest 04/27/2018  Chest x-ray 04/27/2018  Antimicrobials:   IV vancomycin 04/27/2018  IV cefepime 04/27/2018   Subjective: Still with complaints of orthopnea.  Feels shortness of breath is slowly improving since admission.  Denies any chest pain.  Objective: Vitals:   04/27/18 2226 04/28/18 0449 04/28/18 1005 04/28/18 1358  BP: 114/90 (!) 128/91 115/85 108/80  Pulse: 63 (!) 42  89  Resp: 20 20  (!) 24  Temp:  97.8 F (36.6 C)  97.6 F (36.4 C)  TempSrc:  Oral  Oral  SpO2: 100% 99%  100%  Weight:  72 kg (158 lb 12.8 oz)    Height:        Intake/Output Summary (Last 24 hours) at 04/28/2018 1714 Last data filed at 04/28/2018 1544 Gross per 24 hour  Intake 960 ml  Output 4775 ml  Net -3815 ml   Filed Weights   04/27/18 1438 04/27/18 2040 04/28/18 0449  Weight: 75.8 kg (167 lb) 73.1 kg (161 lb 1.6 oz) 72 kg (158 lb 12.8 oz)    Examination:  General exam: Appears calm and comfortable  Respiratory system: Bibasilar crackles.  No wheezing.  No rhonchi.  Respiratory effort normal. Cardiovascular system: S1 & S2 heard, RRR. No JVD, murmurs, rubs, gallops or clicks. No pedal edema. Gastrointestinal system: Abdomen is nondistended, soft and nontender. No organomegaly or masses felt. Normal bowel sounds heard. Central nervous system: Alert and oriented. No focal neurological deficits. Extremities: Symmetric 5 x 5 power. Skin: No rashes, lesions or ulcers Psychiatry: Judgement and insight appear normal. Mood & affect appropriate.     Data Reviewed: I have personally reviewed following labs and imaging studies  CBC: Recent Labs  Lab 04/27/18 1510 04/28/18 0517  WBC 5.2 6.5  NEUTROABS 3.2 4.1  HGB 10.1*  10.2*  HCT 30.3* 31.7*  MCV 92.1 94.3  PLT 179 921   Basic Metabolic Panel: Recent Labs  Lab 04/27/18 1510 04/27/18 2314 04/28/18 0517  NA 139  --  143  K 2.8*  --  3.1*  CL 102  --  104  CO2 26  --  28  GLUCOSE 101*  --  119*  BUN 15  --  16  CREATININE 0.91  --  0.95  CALCIUM 8.4*  --  8.7*  MG  --  1.3*  --    GFR: Estimated Creatinine Clearance: 84.2 mL/min (by C-G formula based on SCr of 0.95 mg/dL). Liver Function Tests: Recent Labs  Lab 04/27/18 1510  AST 21  ALT 20  ALKPHOS 95  BILITOT 1.2  PROT 7.1  ALBUMIN 3.5   Recent Labs  Lab 04/27/18 1510  LIPASE 24   No results for input(s): AMMONIA in the last 168 hours. Coagulation Profile: No results for input(s): INR, PROTIME in the last 168 hours. Cardiac Enzymes: Recent Labs  Lab 04/27/18 1510 04/27/18 2314  TROPONINI 0.05* 0.05*   BNP (last 3 results) No results for input(s): PROBNP in the last 8760 hours. HbA1C: No results for input(s): HGBA1C in  the last 72 hours. CBG: No results for input(s): GLUCAP in the last 168 hours. Lipid Profile: No results for input(s): CHOL, HDL, LDLCALC, TRIG, CHOLHDL, LDLDIRECT in the last 72 hours. Thyroid Function Tests: Recent Labs    04/27/18 2314  TSH 1.321   Anemia Panel: No results for input(s): VITAMINB12, FOLATE, FERRITIN, TIBC, IRON, RETICCTPCT in the last 72 hours. Sepsis Labs: Recent Labs  Lab 04/27/18 1606 04/27/18 2314  PROCALCITON  --  0.17  LATICACIDVEN 2.03*  --     Recent Results (from the past 240 hour(s))  Blood Culture (routine x 2)     Status: None (Preliminary result)   Collection Time: 04/27/18  3:50 PM  Result Value Ref Range Status   Specimen Description   Final    BLOOD RIGHT ANTECUBITAL Performed at [facility], [address] [place], [place] [address]    Special Requests   Final    BOTTLES DRAWN AEROBIC AND ANAEROBIC Blood Culture adequate volume Performed at [facility], [address]  [place], [place] [address]    Culture   Final    NO GROWTH < 24 HOURS Performed at [facility], [place] [address], [place], [place] [address]    Report Status PENDING  Incomplete  Blood Culture (routine x 2)     Status: None (Preliminary result)   Collection Time: 04/27/18  3:55 PM  Result Value Ref Range Status   Specimen Description   Final    BLOOD LEFT ANTECUBITAL Performed at [facility], [address] [place], [place] [address]    Special Requests   Final    BOTTLES DRAWN AEROBIC AND ANAEROBIC Blood Culture adequate volume Performed at [facility], [address] [place], [place] [address]    Culture   Final    NO GROWTH < 24 HOURS Performed at [facility], [place] [address], [place], [place] [address]    Report Status PENDING  Incomplete  MRSA PCR Screening     Status: None   Collection Time: 04/28/18  7:52 AM  Result Value Ref Range Status   MRSA by PCR NEGATIVE NEGATIVE Final    Comment:        The GeneXpert MRSA Assay (FDA approved for NASAL specimens only), is one component of a comprehensive MRSA colonization surveillance program. It is not intended to diagnose MRSA infection nor to guide or monitor treatment for MRSA infections. Performed at [facility], [place] [address] [place], [place] [address]          Radiology Studies: Dg Chest 2 View  Result Date: 04/27/2018 CLINICAL DATA:  Cough and congestion with shortness of breath. History of leukemia EXAM: CHEST - 2 VIEW COMPARISON:  None. FINDINGS: There is airspace consolidation in the left lower lobe with small left pleural effusion. There is more hazy opacity throughout portions of the right middle and lower lobes. Suspect mild loculated effusion on the right. There is cardiomegaly with pulmonary vascularity normal. No adenopathy. No evident bone lesions. IMPRESSION: Multifocal pneumonia. Small left pleural effusion. There may be mild loculated effusion on  the right. There is cardiomegaly.  No adenopathy evident. Electronically Signed   By: Lowella Grip III M.D.   On: 04/27/2018 15:30   Ct Angio Chest Pe W And/or Wo Contrast  Result Date: 04/27/2018 CLINICAL DATA:  History of leukemia with shortness of breath, initial encounter EXAM: CT ANGIOGRAPHY CHEST WITH CONTRAST  TECHNIQUE: Multidetector CT imaging of the chest was performed using the standard protocol during bolus administration of intravenous contrast. Multiplanar CT image reconstructions and MIPs were obtained to evaluate the vascular anatomy. CONTRAST:  159mL ISOVUE-370 IOPAMIDOL (ISOVUE-370) INJECTION 76% COMPARISON:  Plain film from earlier in the same day. FINDINGS: Cardiovascular: Thoracic aorta demonstrates atherosclerotic calcifications without aneurysmal dilatation or dissection. The pulmonary artery shows a normal branching pattern. No filling defects to suggest pulmonary embolism are seen. Mild cardiomegaly is noted. Mild coronary calcifications are seen. Mediastinum/Nodes: Thoracic inlet is within normal limits. No significant hilar or mediastinal adenopathy is noted. The esophagus as visualized is within normal limits. Lungs/Pleura: Bilateral pleural effusions are noted left slightly greater than right. Some associated right lower lobe infiltrate is noted. Fluid is also noted within the major fissure. No focal nodularity is noted. Upper Abdomen: Within normal limits. Musculoskeletal: Within normal limits. Review of the MIP images confirms the above findings. IMPRESSION: No evidence of pulmonary emboli. Bilateral pleural effusions are noted left greater than right with evidence of right lower lobe infiltrate. Aortic Atherosclerosis (ICD10-I70.0). Electronically Signed   By: Inez Catalina M.D.   On: 04/27/2018 17:11        Scheduled Meds: . aspirin  81 mg Oral Daily  . dronabinol  2.5 mg Oral TID  . enoxaparin (LOVENOX) injection  40 mg Subcutaneous QHS  . famotidine  20 mg Oral  BID  . fluconazole  200 mg Oral Daily  . furosemide      . furosemide  40 mg Intravenous Q12H  . lisinopril  2.5 mg Oral Daily  . magnesium oxide  400 mg Oral TID   Continuous Infusions: . ceFEPime (MAXIPIME) IV 2 g (04/28/18 1631)  . vancomycin       LOS: 1 day    Time spent: 40 minutes    Irine Seal, MD [facility] Pager [phone]  If 7PM-7AM, please contact night-coverage [email or web address] Password [email or web address] 04/28/2018, 5:14 PM

## 2018-04-28 NOTE — Consult Note (Signed)
Cardiology Consultation:   Patient ID: Brendan Ellis; [id number]; [birth year]   Admit date: 04/27/2018 Date of Consult: 04/28/2018  Primary Care Provider: Robert Bellow, PA-C Primary Cardiologist: Dr. Mahala [facility]enghini [facility] cardiology    Patient Profile:   Brendan Ellis is a 62 y.o. male with a hx of dilated cardiomyopathy who is being seen today for the evaluation of acute systolic heart failure at the request of Brendan Ellis.  History of Present Illness:   Brendan Ellis 62 year old male living in [facility], recently seen Brendan Ellis with [facility] cardiology with AML on chemotherapy, EF 30% previously on low-dose carvedilol secondary to frequent PVCs and PACs and cardiomyopathy who recently had his diuretics held as well as carvedilol because of hypotension here for worsening shortness of breath, x-ray shows pleural effusions.  Feeling better after some diuresis.  Out 2.6 L.  Weight is 158.  No chest pain.   Past Medical History:  Diagnosis Date  . Hypertension   . Leukemia ([place])     History reviewed. No pertinent surgical history.   Home Medications:  Prior to Admission medications   Medication Sig Start Date End Date Taking? Authorizing Provider  acetaminophen (TYLENOL) 500 MG tablet Take 500-1,000 mg by mouth every 6 (six) hours as needed for moderate pain.   Yes [provider]  carvedilol (COREG) 3.125 MG tablet Take 3.125 mg by mouth 2 (two) times daily with a meal. (BP > 120 Take 2 tablets)   Yes [provider]  dronabinol (MARINOL) 2.5 MG capsule Take 2.5 mg by mouth 3 (three) times daily.   Yes [provider]  famotidine (PEPCID) 20 MG tablet Take 20 mg by mouth 2 (two) times daily.    Yes [provider]  furosemide (LASIX) 40 MG tablet Take 40 mg by mouth daily.    Yes [provider]  lisinopril (PRINIVIL,ZESTRIL) 10 MG tablet Take 10 mg by mouth daily as needed (BP >130).    Yes [provider]  magnesium oxide (MAG-OX) 400 MG tablet Take 400 mg by mouth 3 (three) times daily.   Yes [provider]  potassium chloride SA (KLOR-CON M15) 15 MEQ tablet Take 30 mEq by mouth 3 (three) times daily.   Yes [provider]  fluconazole (DIFLUCAN) 200 MG tablet Take 200 mg by mouth.  04/03/18   [provider]  spironolactone (ALDACTONE) 25 MG tablet Take 25 mg by mouth daily as needed (fluid retention).  03/26/18   [provider]    Inpatient Medications: Scheduled Meds: . aspirin  325 mg Oral Daily  . dronabinol  2.5 mg Oral TID  . enoxaparin (LOVENOX) injection  40 mg Subcutaneous QHS  . famotidine  20 mg Oral BID  . fluconazole  200 mg Oral Daily  . furosemide  40 mg Intravenous Q12H  . lisinopril  5 mg Oral Daily  . magnesium oxide  400 mg Oral TID  . potassium chloride  40 mEq Oral Q4H   Continuous Infusions: . ceFEPime (MAXIPIME) IV Stopped (04/28/18 0552)  . magnesium sulfate 1 - 4 g bolus IVPB    . vancomycin Stopped (04/28/18 0019)   PRN Meds: acetaminophen **OR** acetaminophen, ondansetron **OR** ondansetron (ZOFRAN) IV  Allergies:   No Known Allergies  Social History:   Social History   Socioeconomic History  . Marital status: Single    Spouse name: Not on file  . Number of children: Not on file  . Years  of education: Not on file  . Highest education level: Not on file  Occupational History  . Not on file  Social Needs  . Financial resource strain: Not on file  . Food insecurity:    Worry: Not on file    Inability: Not on file  . Transportation needs:    Medical: Not on file    Non-medical: Not on file  Tobacco Use  . Smoking status: Never Smoker  . Smokeless tobacco: Never Used  Substance and Sexual Activity  . Alcohol use: Never    Frequency: Never  . Drug use: Never  . Sexual activity: Not on file  Lifestyle  . Physical activity:    Days per week: Not on file    Minutes per session: Not on file    . Stress: Not on file  Relationships  . Social connections:    Talks on phone: Not on file    Gets together: Not on file    Attends religious service: Not on file    Active member of club or organization: Not on file    Attends meetings of clubs or organizations: Not on file    Relationship status: Not on file  . Intimate partner violence:    Fear of current or ex partner: Not on file    Emotionally abused: Not on file    Physically abused: Not on file    Forced sexual activity: Not on file  Other Topics Concern  . Not on file  Social History Narrative  . Not on file    Family History:    Family History  Problem Relation Age of Onset  . Hypertension Mother   . Diabetes Mother      ROS:  Please see the history of present illness.  All other ROS reviewed and negative.     Physical Exam/Data:   Vitals:   04/27/18 1830 04/27/18 2040 04/27/18 2226 04/28/18 0449  BP: (!) 133/95 (!) 142/109 114/90 (!) 128/91  Pulse: 99 (!) 53 63 (!) 42  Resp: 18 20 20 20   Temp:  97.9 F (36.6 C)  97.8 F (36.6 C)  TempSrc:  Oral  Oral  SpO2: 100% 100% 100% 99%  Weight:  161 lb 1.6 oz (73.1 kg)  158 lb 12.8 oz (72 kg)  Height:  6\' 2"  (1.88 m)      Intake/Output Summary (Last 24 hours) at 04/28/2018 0852 Last data filed at 04/28/2018 0711 Gross per 24 hour  Intake 680 ml  Output 3700 ml  Net -3020 ml   Filed Weights   04/27/18 1438 04/27/18 2040 04/28/18 0449  Weight: 167 lb (75.8 kg) 161 lb 1.6 oz (73.1 kg) 158 lb 12.8 oz (72 kg)   Body mass index is 20.39 kg/m.  General: Thin in no acute distress HEENT: normal Lymph: no adenopathy Neck: no JVD Endocrine:  No thryomegaly Vascular: No carotid bruits; FA pulses 2+ bilaterally without bruits  Cardiac:  normal S1, S2; RRR; no murmur, occasional ectopy Lungs: Mild crackles heard especially left lower base Abd: soft, nontender, no hepatomegaly, thin Ext: no edema Musculoskeletal:  No deformities, BUE and BLE strength normal and  equal Skin: warm and dry  Neuro:  CNs 2-12 intact, no focal abnormalities noted Psych:  Normal affect   EKG:  The EKG was personally reviewed and demonstrates: Sinus rhythm with PACs Telemetry:  Telemetry was personally reviewed and demonstrates: Sinus rhythm with PACs  Relevant CV Studies: EF 30%  Laboratory Data:  Chemistry Recent  Labs  Lab 04/27/18 1510 04/28/18 0517  NA 139 143  K 2.8* 3.1*  CL 102 104  CO2 26 28  GLUCOSE 101* 119*  BUN 15 16  CREATININE 0.91 0.95  CALCIUM 8.4* 8.7*  GFRNONAA >60 >60  GFRAA >60 >60  ANIONGAP 11 11    Recent Labs  Lab 04/27/18 1510  PROT 7.1  ALBUMIN 3.5  AST 21  ALT 20  ALKPHOS 95  BILITOT 1.2   Hematology Recent Labs  Lab 04/27/18 1510 04/28/18 0517  WBC 5.2 6.5  RBC 3.29* 3.36*  HGB 10.1* 10.2*  HCT 30.3* 31.7*  MCV 92.1 94.3  MCH 30.7 30.4  MCHC 33.3 32.2  RDW 17.4* 18.5*  PLT 179 199   Cardiac Enzymes Recent Labs  Lab 04/27/18 1510 04/27/18 2314  TROPONINI 0.05* 0.05*   No results for input(s): TROPIPOC in the last 168 hours.  BNP Recent Labs  Lab 04/27/18 1510  BNP 1,976.9*    DDimer  Recent Labs  Lab 04/27/18 1510  DDIMER 2.86*    Radiology/Studies:  Dg Chest 2 View  Result Date: 04/27/2018 CLINICAL DATA:  Cough and congestion with shortness of breath. History of leukemia EXAM: CHEST - 2 VIEW COMPARISON:  None. FINDINGS: There is airspace consolidation in the left lower lobe with small left pleural effusion. There is more hazy opacity throughout portions of the right middle and lower lobes. Suspect mild loculated effusion on the right. There is cardiomegaly with pulmonary vascularity normal. No adenopathy. No evident bone lesions. IMPRESSION: Multifocal pneumonia. Small left pleural effusion. There may be mild loculated effusion on the right. There is cardiomegaly.  No adenopathy evident. Electronically Signed   By: Lowella Grip III M.D.   On: 04/27/2018 15:30   Ct Angio Chest Pe W And/or  Wo Contrast  Result Date: 04/27/2018 CLINICAL DATA:  History of leukemia with shortness of breath, initial encounter EXAM: CT ANGIOGRAPHY CHEST WITH CONTRAST TECHNIQUE: Multidetector CT imaging of the chest was performed using the standard protocol during bolus administration of intravenous contrast. Multiplanar CT image reconstructions and MIPs were obtained to evaluate the vascular anatomy. CONTRAST:  154mL ISOVUE-370 IOPAMIDOL (ISOVUE-370) INJECTION 76% COMPARISON:  Plain film from earlier in the same day. FINDINGS: Cardiovascular: Thoracic aorta demonstrates atherosclerotic calcifications without aneurysmal dilatation or dissection. The pulmonary artery shows a normal branching pattern. No filling defects to suggest pulmonary embolism are seen. Mild cardiomegaly is noted. Mild coronary calcifications are seen. Mediastinum/Nodes: Thoracic inlet is within normal limits. No significant hilar or mediastinal adenopathy is noted. The esophagus as visualized is within normal limits. Lungs/Pleura: Bilateral pleural effusions are noted left slightly greater than right. Some associated right lower lobe infiltrate is noted. Fluid is also noted within the major fissure. No focal nodularity is noted. Upper Abdomen: Within normal limits. Musculoskeletal: Within normal limits. Review of the MIP images confirms the above findings. IMPRESSION: No evidence of pulmonary emboli. Bilateral pleural effusions are noted left greater than right with evidence of right lower lobe infiltrate. Aortic Atherosclerosis (ICD10-I70.0). Electronically Signed   By: Inez Catalina M.D.   On: 04/27/2018 17:11    Assessment and Plan:   Acute on chronic systolic heart failure - EF 30% global hypokinesis with shortness of breath NYHA class III. - Diuretics held because of low blood pressure recently.  Worsening shortness of breath, Lasix restarted. -Lasix currently on 40 mg IV every 12 hours.  Lisinopril decreased from 10-5.  I will decrease  lisinopril further to 2.5 to allow for  further adequate diuresis. -Hopefully tomorrow we will be able to add back low-dose carvedilol.  Frequent PAC's - We will try to restart low dose carvedilol. -There was some thought of paroxysmal atrial fibrillation present but this is not the case.  I personally reviewed telemetry and there are P waves preceding each QRS complex.  Does not need anticoagulation.  Troponin elevation 0.05 -Demand ischemia in the setting of heart failure.  Not ACS.  History of AML - States that his chemotherapy is about to start on Monday.  History of acute kidney injury -Per history in February was on dialysis briefly.  Currently creatinine normal.  Hypokalemia -Potassium 3.1.  Replete.  Try to maintain greater than 4.  History of requiring gallbladder percutaneous tube drainage  Had a history of wearing a LifeVest.   For questions or updates, please contact [place] Please consult [email or web address] for contact info under Cardiology/STEMI.   Signed, Candee Furbish, MD  04/28/2018 8:52 AM

## 2018-04-28 NOTE — Progress Notes (Signed)
Pharmacy Antibiotic Note  Brendan Ellis is a 62 y.o. male admitted on 04/27/2018 with pneumonia.  Pharmacy has been consulted for cefepime and vancomycin dosing.  04/28/2018 Scr 0.95, CrCl ~ 84.25mls/min  Plan: - based on AUC calculations decrease vanc to 750mg  IV q12h (AUC 431.4, using TBW) - continue cefepime 2 G every 8 hours - monitor clinical progression, length of therapy, renal function, and vancomycin levels as needed  Height: 6\' 2"  (188 cm) Weight: 158 lb 12.8 oz (72 kg) IBW/kg (Calculated) : 82.2  Temp (24hrs), Avg:97.9 F (36.6 C), Min:97.8 F (36.6 C), Max:98 F (36.7 C)  Recent Labs  Lab 04/27/18 1510 04/27/18 1606 04/28/18 0517  WBC 5.2  --  6.5  CREATININE 0.91  --  0.95  LATICACIDVEN  --  2.03*  --     Estimated Creatinine Clearance: 84.2 mL/min (by C-G formula based on SCr of 0.95 mg/dL).    No Known Allergies  Antimicrobials this admission: Cefepime 5/31>>  Vancomycin 5/31 >>   Dose adjustments this admission: N/A  Microbiology results: 5/31 BCx: pending   Thank you for allowing pharmacy to be a part of this patient's care.  Dolly Rias RPh 04/28/2018, 11:13 AM Pager [phone]

## 2018-04-28 NOTE — Progress Notes (Signed)
CRITICAL VALUE ALERT  Critical Value:  Troponin 0.05  Date & Time Notied:  04/28/18 0000  Provider Notified: Jeannette Corpus  Orders Received/Actions taken:  No new orders given

## 2018-04-29 ENCOUNTER — Inpatient Hospital Stay (HOSPITAL_COMMUNITY): Payer: BLUE CROSS/BLUE SHIELD

## 2018-04-29 DIAGNOSIS — C9200 Acute myeloblastic leukemia, not having achieved remission: Secondary | ICD-10-CM

## 2018-04-29 DIAGNOSIS — I340 Nonrheumatic mitral (valve) insufficiency: Secondary | ICD-10-CM

## 2018-04-29 LAB — CBC
HCT: 32.3 % — ABNORMAL LOW (ref 39.0–52.0)
HEMOGLOBIN: 10.4 g/dL — AB (ref 13.0–17.0)
MCH: 29.9 pg (ref 26.0–34.0)
MCHC: 32.2 g/dL (ref 30.0–36.0)
MCV: 92.8 fL (ref 78.0–100.0)
Platelets: 180 10*3/uL (ref 150–400)
RBC: 3.48 MIL/uL — ABNORMAL LOW (ref 4.22–5.81)
RDW: 18.1 % — ABNORMAL HIGH (ref 11.5–15.5)
WBC: 4.8 10*3/uL (ref 4.0–10.5)

## 2018-04-29 LAB — BASIC METABOLIC PANEL
Anion gap: 13 (ref 5–15)
BUN: 18 mg/dL (ref 6–20)
CO2: 26 mmol/L (ref 22–32)
Calcium: 8.6 mg/dL — ABNORMAL LOW (ref 8.9–10.3)
Chloride: 100 mmol/L — ABNORMAL LOW (ref 101–111)
Creatinine, Ser: 0.94 mg/dL (ref 0.61–1.24)
GFR calc non Af Amer: >60 mL/min (ref >60)
Glucose, Bld: 136 mg/dL — ABNORMAL HIGH (ref 65–99)
POTASSIUM: 3.1 mmol/L — AB (ref 3.5–5.1)
SODIUM: 139 mmol/L (ref 135–145)

## 2018-04-29 LAB — ECHOCARDIOGRAM COMPLETE
Height: 74 in
Weight: 2465.6 oz

## 2018-04-29 LAB — MAGNESIUM: MAGNESIUM: 1.8 mg/dL (ref 1.7–2.4)

## 2018-04-29 MED ORDER — MAGNESIUM SULFATE 2 GM/50ML IV SOLN
2.0000 g | Freq: Once | INTRAVENOUS | Status: AC
  Administered 2018-04-29: 2 g via INTRAVENOUS
  Filled 2018-04-29: qty 50

## 2018-04-29 MED ORDER — ASPIRIN 81 MG PO CHEW: 81.0000 mg | CHEWABLE_TABLET | Freq: Every day | ORAL | Status: DC

## 2018-04-29 MED ORDER — AMOXICILLIN-POT CLAVULANATE 875-125 MG PO TABS: 1.0000 | ORAL_TABLET | Freq: Two times a day (BID) | ORAL | 0 refills | Status: AC

## 2018-04-29 MED ORDER — POTASSIUM CHLORIDE CRYS ER 20 MEQ PO TBCR
40.0000 meq | EXTENDED_RELEASE_TABLET | ORAL | Status: AC
  Administered 2018-04-29 (×2): 40 meq via ORAL
  Filled 2018-04-29 (×2): qty 2

## 2018-04-29 MED ORDER — CARVEDILOL 3.125 MG PO TABS
3.1250 mg | ORAL_TABLET | Freq: Two times a day (BID) | ORAL | Status: DC
  Administered 2018-04-29: 3.125 mg via ORAL
  Filled 2018-04-29: qty 1

## 2018-04-29 MED ORDER — LISINOPRIL 2.5 MG PO TABS: 2.5000 mg | ORAL_TABLET | Freq: Every day | ORAL | 0 refills | Status: DC | PRN

## 2018-04-29 NOTE — Progress Notes (Signed)
  Echocardiogram 2D Echocardiogram has been performed.  Brendan Ellis Brendan Ellis 04/29/2018, 10:03 AM

## 2018-04-29 NOTE — Progress Notes (Signed)
Pt discharged from the unit via wheelchair. Discharge instructions were reviewed with pt and family members. No questions or concerns at this time.  

## 2018-04-29 NOTE — Progress Notes (Signed)
Progress Note  Patient Name: Brendan Ellis Date of Encounter: 04/29/2018  Primary Cardiologist: No primary care provider on file.   Subjective   Feeling much better, no shortness of breath, sitting up in bed comfortable eating breakfast.  Inpatient Medications    Scheduled Meds: . aspirin  81 mg Oral Daily  . dronabinol  2.5 mg Oral TID  . enoxaparin (LOVENOX) injection  40 mg Subcutaneous QHS  . famotidine  20 mg Oral BID  . fluconazole  200 mg Oral Daily  . furosemide  40 mg Intravenous Q12H  . lisinopril  2.5 mg Oral Daily  . magnesium oxide  400 mg Oral TID   Continuous Infusions: . ceFEPime (MAXIPIME) IV Stopped (04/29/18 0418)  . vancomycin Stopped (04/28/18 2242)   PRN Meds: acetaminophen **OR** acetaminophen, ondansetron **OR** ondansetron (ZOFRAN) IV   Vital Signs    Vitals:   04/28/18 1358 04/28/18 2204 04/29/18 0348 04/29/18 0352  BP: 108/80 (!) 117/96  107/82  Pulse: 89 96  (!) 51  Resp: (!) 24 18  18   Temp: 97.6 F (36.4 C) (!) 97.5 F (36.4 C)  97.7 F (36.5 C)  TempSrc: Oral Oral  Oral  SpO2: 100% 100%  100%  Weight:   154 lb 1.6 oz (69.9 kg)   Height:        Intake/Output Summary (Last 24 hours) at 04/29/2018 0749 Last data filed at 04/29/2018 0600 Gross per 24 hour  Intake 930 ml  Output 2150 ml  Net -1220 ml   Filed Weights   04/27/18 2040 04/28/18 0449 04/29/18 0348  Weight: 161 lb 1.6 oz (73.1 kg) 158 lb 12.8 oz (72 kg) 154 lb 1.6 oz (69.9 kg)    Telemetry    Sinus rhythm/sinus tachycardia with occasional bouts of paroxysmal atrial tachycardia, PACs noted.- Personally Reviewed  ECG    No new- Personally Reviewed  Physical Exam   GEN: No acute distress.   Neck: No JVD Cardiac: RRR, occasional ectopy, no murmurs, rubs, or gallops.  Respiratory: Clear to auscultation bilaterally. GI: Soft, nontender, non-distended  MS: No edema; No deformity. Neuro:  Nonfocal  Psych: Normal affect   Labs    Chemistry Recent Labs  Lab  04/27/18 1510 04/28/18 0517 04/29/18 0458  NA 139 143 139  K 2.8* 3.1* 3.1*  CL 102 104 100*  CO2 26 28 26   GLUCOSE 101* 119* 136*  BUN 15 16 18   CREATININE 0.91 0.95 0.94  CALCIUM 8.4* 8.7* 8.6*  PROT 7.1  --   --   ALBUMIN 3.5  --   --   AST 21  --   --   ALT 20  --   --   ALKPHOS 95  --   --   BILITOT 1.2  --   --   GFRNONAA >60 >60 >60  GFRAA >60 >60 >60  ANIONGAP 11 11 13      Hematology Recent Labs  Lab 04/27/18 1510 04/28/18 0517 04/29/18 0458  WBC 5.2 6.5 4.8  RBC 3.29* 3.36* 3.48*  HGB 10.1* 10.2* 10.4*  HCT 30.3* 31.7* 32.3*  MCV 92.1 94.3 92.8  MCH 30.7 30.4 29.9  MCHC 33.3 32.2 32.2  RDW 17.4* 18.5* 18.1*  PLT 179 199 180    Cardiac Enzymes Recent Labs  Lab 04/27/18 1510 04/27/18 2314  TROPONINI 0.05* 0.05*   No results for input(s): TROPIPOC in the last 168 hours.   BNP Recent Labs  Lab 04/27/18 1510  BNP 1,976.9*  DDimer  Recent Labs  Lab 04/27/18 1510  DDIMER 2.86*     Radiology    Dg Chest 2 View  Result Date: 04/27/2018 CLINICAL DATA:  Cough and congestion with shortness of breath. History of leukemia EXAM: CHEST - 2 VIEW COMPARISON:  None. FINDINGS: There is airspace consolidation in the left lower lobe with small left pleural effusion. There is more hazy opacity throughout portions of the right middle and lower lobes. Suspect mild loculated effusion on the right. There is cardiomegaly with pulmonary vascularity normal. No adenopathy. No evident bone lesions. IMPRESSION: Multifocal pneumonia. Small left pleural effusion. There may be mild loculated effusion on the right. There is cardiomegaly.  No adenopathy evident. Electronically Signed   By: Lowella Grip III M.D.   On: 04/27/2018 15:30   Ct Angio Chest Pe W And/or Wo Contrast  Result Date: 04/27/2018 CLINICAL DATA:  History of leukemia with shortness of breath, initial encounter EXAM: CT ANGIOGRAPHY CHEST WITH CONTRAST TECHNIQUE: Multidetector CT imaging of the chest  was performed using the standard protocol during bolus administration of intravenous contrast. Multiplanar CT image reconstructions and MIPs were obtained to evaluate the vascular anatomy. CONTRAST:  128mL ISOVUE-370 IOPAMIDOL (ISOVUE-370) INJECTION 76% COMPARISON:  Plain film from earlier in the same day. FINDINGS: Cardiovascular: Thoracic aorta demonstrates atherosclerotic calcifications without aneurysmal dilatation or dissection. The pulmonary artery shows a normal branching pattern. No filling defects to suggest pulmonary embolism are seen. Mild cardiomegaly is noted. Mild coronary calcifications are seen. Mediastinum/Nodes: Thoracic inlet is within normal limits. No significant hilar or mediastinal adenopathy is noted. The esophagus as visualized is within normal limits. Lungs/Pleura: Bilateral pleural effusions are noted left slightly greater than right. Some associated right lower lobe infiltrate is noted. Fluid is also noted within the major fissure. No focal nodularity is noted. Upper Abdomen: Within normal limits. Musculoskeletal: Within normal limits. Review of the MIP images confirms the above findings. IMPRESSION: No evidence of pulmonary emboli. Bilateral pleural effusions are noted left greater than right with evidence of right lower lobe infiltrate. Aortic Atherosclerosis (ICD10-I70.0). Electronically Signed   By: Inez Catalina M.D.   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  Assessment & Plan    Acute on chronic systolic heart failure - Good overall IV diuresis, yesterday 1.6 L net.  Weight 154 pounds.  Admit weight 167 pounds. - Blood pressures have been soft.  Lisinopril was decreased to 2.5 mg once a day.  We will go ahead and initiate Coreg 3.125 twice a day as he was taking at  home because of periods of paroxysmal atrial tachycardia.  I will hold off on Spironolactone. - I recommend he once again take his Lasix 40 mg once a day at home with additional Lasix taken if weight increases by 3 pounds.  Reviewed instructions.  No salt.  Fluid restrict 1.5 L.  Comfortable with his discharge. -Would discharge on carvedilol 3.125 mg twice a day, lisinopril 2.5 mg once a day, Lasix 40 mg once a day, continue with potassium 40 mEq daily.  Paroxysmal atrial tachycardia -Telemetry reviewed, mild tachycardia noted.  No evidence of atrial fibrillation however.  P waves are present. -We will go ahead and give him carvedilol 3.125 twice daily as he was taking at home.  Hypokalemia -Replete  AML -Chemotherapy tomorrow.  Elevated troponin - Flat low level, demand ischemia in the setting of cardiomyopathy.  Comfortable with him being discharged so he can make his chemotherapy tomorrow.  He does have an echocardiogram scheduled as an outpatient later this month.  I counseled him on taking his Lasix 40 mg once a day and if his weight goes up by 3 pounds to take an additional 40 mg of Lasix.  He understands.  Daily weights.  For questions or updates, please contact [facility] Please consult [email or web address] for contact info under Cardiology/STEMI.      Signed, Candee Furbish, MD  04/29/2018, 7:49 AM

## 2018-04-29 NOTE — Discharge Summary (Signed)
Physician Discharge Summary  Brendan Ellis:[id number] DOB: 1956-08-18 DOA: 04/27/2018  PCP: Robert Bellow, PA-C  Admit date: 04/27/2018 Discharge date: 04/29/2018  Time spent: 65 minutes  Recommendations for Outpatient Follow-up:  1. Follow-up with Dr. Mahala Menghini, cardiology in 1 week for follow-up on acute CHF exacerbation.  On follow-up 2D echo which was done during his hospitalization on 04/29/2018 will need to be followed up upon as results were pending at time of discharge.  Patient will need a basic metabolic profile done, Magnesium level on follow-up to follow-up on electrolytes and renal function. 2. Follow-up with Robert Bellow, PA-C in 2 weeks.  On follow-up patient will need a basic metabolic profile done to follow-up on electrolytes and renal function.  Patient will need a CBC done.  Pneumonia needs to be followed up upon.   Discharge Diagnoses:  Principal Problem:   Acute on chronic systolic congestive heart failure (HCC) Active Problems:   Acute exacerbation of CHF (congestive heart failure) ([place])   Community acquired pneumonia of right lower lobe of lung (HCC)   AML (acute myeloblastic leukemia) (HCC)   Acute systolic CHF (congestive heart failure) (HCC)   Hypokalemia   Hypomagnesemia   PAC (premature atrial contraction)   Discharge Condition: Stable and improved  Diet recommendation: Heart healthy  Filed Weights   04/27/18 2040 04/28/18 0449 04/29/18 0348  Weight: 73.1 kg (161 lb 1.6 oz) 72 kg (158 lb 12.8 oz) 69.9 kg (154 lb 1.6 oz)    History of present illness:  Per Dr Arlester Marker is a 62 y.o. 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  Patient has known history of cardiomyopathy and is on Lasix spironolactone Coreg and lisinopril.  Patient's diuretics and coreg were discontinued 2 weeks ago due to low normal blood pressure.  Patient also had recent cholecystostomy tube removed after having been diagnosed with cholecystitis.  Over the last 3 days patient's shortness of breath worsened Lasix was restarted.  Patient also has experienced productive cough.  Denied any chest pain.  Shortness of breath is on exertion and lying flat.  Also noticed some swelling in the lower extremity's bilaterally.  ED Course: In the ER at [facility] patient had labs drawn which showed BNP of 1900 with mildly elevated troponin.  EKG was showing sinus tachycardia with PVCs and LVH.  Patient has known history of paroxysmal atrial fibrillation.  Since d-dimer was elevated CT angiogram was done which showed bilateral pleural effusion and possible infiltrates.  Patient was started on empiric antibiotics for possible pneumonia and Lasix 40 mg IV for CHF.  Patient also has severe hypokalemia.     Hospital Course:  1 acute on chronic systolic heart failure/cardiomyopathy Last EF was 25 to 30% with global hypokinesia in March 2019.  Patient presented with worsening shortness of breath.  Patient's ACE inhibitor dose has been decreased to accommodate for diuretics and beta-blocker.  Patient was placed on IV Lasix with good diuresis and was negative for 4.5 L during this hospitalization.   Cardiac enzymes with minimally elevated troponins which seem to have plateaued and likely secondary to acute CHF exacerbation.  2D echo has been ordered  and is pending at time of discharge which will be followed up upon with patient's cardiologist.  Due to patient's significantly low ejection fraction and history of cardiomyopathy cardiology has been consulted.  Was seen in consultation by cardiology who followed the patient throughout the hospitalization.   Patient's lisinopril dose was decreased to 2.5 mg daily to allow for adequate diuresis.  Patient spironolactone was discontinued.  Coreg was initially held and subsequently resumed.  Patient was placed on IV Lasix with good diuresis and was negative for 4.5 L during this hospitalization. Patient improved clinically and was satting greater than  95% on room air and on ambulation prior to discharge.  Patient was discharged home in stable and improved condition is to follow-up with his cardiologist and PCP in the outpatient setting.    2.  Probable community-acquired pneumonia versus HCAP Patient with a history of AML currently undergoing consolidation therapy to be started next week.  Chest x-ray and CT chest concerning for infiltrates.  Blood cultures have been ordered with no growth to date.  Sputum cultures also pending at time of discharge.  Patient was initially placed empirically on IV vancomycin IV cefepime.  MRSA PCR was negative and IV vancomycin discontinued.  Patient be discharged home on 6 more days of oral Augmentin to complete a 7-day course of antibiotic treatment.    3.  Hypokalemia/hypomagnesemia Likely secondary to diuretics.  Repleted.  4.  Frequent PACs  Initially was felt patient was going in and out of A. fib and sinus tachycardia.    Electrolytes repleted.  Coreg was initially held and subsequently resumed.  Replete electrolytes.  Coreg on hold.  Patient has been seen in consultation by cardiology who reviewed telemetry and feel patient does not have A. fib as he is noted to have P waves preceding each QRS complex on telemetry.  Per cardiology Coreg was resumed at patient's home dose.  No need for anticoagulation at this time as per cardiology patient likely with PACs.   5.  Elevated troponin Likely secondary to demand ischemia secondary to problem #1.  By cardiology not ACS.  2D echo ordered and pending.  6 history of AML Patient was supposed to start consolidative therapy  next week.  Outpatient follow-up with oncology.  7 normocytic anemia Likely secondary to AML.  No overt bleeding.  Hemoglobin remained stable during the hospitalization.   8: History of acute kidney injury It is noted per admitting MD that in February 2019 patient was briefly on hemodialysis.  Renal function main stable throughout the hospitalization.  Outpatient follow-up with PCP.       Procedures:  CT angiogram chest 04/27/2018  Chest x-ray 04/27/2018      Consultations:  Cardiology: Dr. Marlou Porch 04/27/2018      Discharge Exam: Vitals:   04/29/18 1348 04/29/18 1433  BP:  99/78  Pulse:  82  Resp:  12  Temp:  98.1 F (36.7 C)  SpO2: 96% 100%    General: NAD Cardiovascular: RRR Respiratory: CTAB  Discharge Instructions   Discharge Instructions    Diet - low sodium heart healthy   Complete by:  As directed    1.5L/Day fluid restriction.   Increase activity slowly   Complete by:  As directed      Allergies as of 04/29/2018   No Known Allergies     Medication List    STOP taking these medications   spironolactone 25 MG tablet Commonly known as:  ALDACTONE     TAKE these  medications   acetaminophen 500 MG tablet Commonly known as:  TYLENOL Take 500-1,000 mg by mouth every 6 (six) hours as needed for moderate pain.   amoxicillin-clavulanate 875-125 MG tablet Commonly known as:  AUGMENTIN Take 1 tablet by mouth 2 (two) times daily for 6 days.   aspirin 81 MG chewable tablet Chew 1 tablet (81 mg total) by mouth daily. Start taking on:  04/30/2018   carvedilol 3.125 MG tablet Commonly known as:  COREG Take 3.125 mg by mouth 2 (two) times daily with a meal. (BP > 120 Take 2 tablets)   dronabinol 2.5 MG capsule Commonly known as:  MARINOL Take 2.5 mg by mouth 3 (three) times daily.   famotidine 20 MG tablet Commonly known as:  PEPCID Take 20 mg by mouth 2 (two) times daily.   fluconazole 200 MG tablet Commonly known as:  DIFLUCAN Take 200  mg by mouth.   furosemide 40 MG tablet Commonly known as:  LASIX Take 40 mg by mouth daily.   lisinopril 2.5 MG tablet Commonly known as:  PRINIVIL,ZESTRIL Take 1 tablet (2.5 mg total) by mouth daily as needed (BP >130). What changed:    medication strength  how much to take   magnesium oxide 400 MG tablet Commonly known as:  MAG-OX Take 400 mg by mouth 3 (three) times daily.   potassium chloride SA 15 MEQ tablet Commonly known as:  KLOR-CON M15 Take 30 mEq by mouth 3 (three) times daily.      No Known Allergies Follow-up Information    Robert Bellow, PA-C. Schedule an appointment as soon as possible for a visit.   Specialty:  Internal Medicine Why:  f/u in 2 weeks Contact information: [address] [place] [address] [phone]        Mahala Menghini, MD. Schedule an appointment as soon as possible for a visit in 1 week(s).   Specialty:  Cardiology Contact information: [address] [place] [address] [phone]            The results of significant diagnostics from this hospitalization (including imaging, microbiology, ancillary and laboratory) are listed below for reference.    Significant Diagnostic Studies: Dg Chest 2 View  Result Date: 04/27/2018 CLINICAL DATA:  Cough and congestion with shortness of breath. History of leukemia EXAM: CHEST - 2 VIEW COMPARISON:  None. FINDINGS: There is airspace consolidation in the left lower lobe with small left pleural effusion. There is more hazy opacity throughout portions of the right middle and lower lobes. Suspect mild loculated effusion on the right. There is cardiomegaly with pulmonary vascularity normal. No adenopathy. No evident bone lesions. IMPRESSION: Multifocal pneumonia. Small left pleural effusion. There may be mild loculated effusion on the right. There is cardiomegaly.  No adenopathy evident. Electronically Signed   By: Lowella Grip III M.D.   On:  04/27/2018 15:30   Ct Angio Chest Pe W And/or Wo Contrast  Result Date: 04/27/2018 CLINICAL DATA:  History of leukemia with shortness of breath, initial encounter EXAM: CT ANGIOGRAPHY CHEST WITH CONTRAST TECHNIQUE: Multidetector CT imaging of the chest was performed using the standard protocol during bolus administration of intravenous contrast. Multiplanar CT image reconstructions and MIPs were obtained to evaluate the vascular anatomy. CONTRAST:  130mL ISOVUE-370 IOPAMIDOL (ISOVUE-370) INJECTION 76% COMPARISON:  Plain film from earlier in the same day. FINDINGS: Cardiovascular: Thoracic aorta demonstrates atherosclerotic calcifications without aneurysmal dilatation or dissection. The pulmonary artery shows a normal branching pattern. No  filling defects to suggest pulmonary embolism are seen. Mild cardiomegaly is noted. Mild coronary calcifications are seen. Mediastinum/Nodes: Thoracic inlet is within normal limits. No significant hilar or mediastinal adenopathy is noted. The esophagus as visualized is within normal limits. Lungs/Pleura: Bilateral pleural effusions are noted left slightly greater than right. Some associated right lower lobe infiltrate is noted. Fluid is also noted within the major fissure. No focal nodularity is noted. Upper Abdomen: Within normal limits. Musculoskeletal: Within normal limits. Review of the MIP images confirms the above findings. IMPRESSION: No evidence of pulmonary emboli. Bilateral pleural effusions are noted left greater than right with evidence of right lower lobe infiltrate. Aortic Atherosclerosis (ICD10-I70.0). Electronically Signed   By: Inez Catalina M.D.   On: 04/27/2018 17:11    Microbiology: Recent Results (from the past 240 hour(s))  Blood Culture (routine x 2)     Status: None (Preliminary result)   Collection Time: 04/27/18  3:50 PM  Result Value Ref Range Status   Specimen Description   Final    BLOOD RIGHT ANTECUBITAL Performed at [facility],  [address] [place], [place] [address]    Special Requests   Final    BOTTLES DRAWN AEROBIC AND ANAEROBIC Blood Culture adequate volume Performed at [facility], [address] [place], [place] [address]    Culture   Final    NO GROWTH 2 DAYS Performed at [facility], [place] [address], [place], [place] [address]    Report Status PENDING  Incomplete  Blood Culture (routine x 2)     Status: None (Preliminary result)   Collection Time: 04/27/18  3:55 PM  Result Value Ref Range Status   Specimen Description   Final    BLOOD LEFT ANTECUBITAL Performed at [facility] [address] [facility], [place] [place], [place] [address]    Special Requests   Final    BOTTLES DRAWN AEROBIC AND ANAEROBIC Blood Culture adequate volume Performed at [facility], [address] [place], [place] [address]    Culture   Final    NO GROWTH 2 DAYS Performed at [facility], [place] [address], [place], [place] [address]    Report Status PENDING  Incomplete  Culture, expectorated sputum-assessment     Status: None   Collection Time: 04/28/18  6:49 AM  Result Value Ref Range Status   Specimen Description SPUTUM  Final   Special Requests NONE  Final   Sputum evaluation   Final    THIS SPECIMEN IS ACCEPTABLE FOR SPUTUM CULTURE Performed at [facility], [place] [address], [place], [place] [address]    Report Status 04/28/2018 FINAL  Final  Culture, respiratory (NON-Expectorated)     Status: None (Preliminary result)   Collection Time: 04/28/18  6:49 AM  Result Value Ref Range Status   Specimen Description   Final    SPUTUM Performed at [place] [address] [place],  [address]    Special Requests   Final    NONE Reflexed from [id number] Performed at [facility] [address] [place], [place] [address]    Gram Stain   Final    MODERATE WBC PRESENT,BOTH PMN AND MONONUCLEAR ABUNDANT SQUAMOUS  EPITHELIAL CELLS PRESENT RARE GRAM POSITIVE COCCI IN PAIRS RARE GRAM POSITIVE RODS RARE BUDDING YEAST SEEN    Culture   Final    NO GROWTH < 12 HOURS Performed at [facility], [place] [address] [place], [place] [address]    Report Status PENDING  Incomplete  MRSA PCR Screening     Status: None   Collection Time: 04/28/18  7:52 AM  Result Value Ref Range Status   MRSA by PCR NEGATIVE NEGATIVE Final    Comment:        The GeneXpert MRSA Assay (FDA approved for NASAL specimens only), is one component of a comprehensive MRSA colonization surveillance program. It is not intended to diagnose MRSA infection nor to guide or monitor treatment for MRSA infections. Performed at [facility], [place] [address], [place], [place] [address]      Labs: Basic Metabolic Panel: Recent Labs  Lab 04/27/18 1510 04/27/18 2314 04/28/18 0517 04/29/18 0458  NA 139  --  143 139  K 2.8*  --  3.1* 3.1*  CL 102  --  104 100*  CO2 26  --  28 26  GLUCOSE 101*  --  119* 136*  BUN 15  --  16 18  CREATININE 0.91  --  0.95 0.94  CALCIUM 8.4*  --  8.7* 8.6*  MG  --  1.3*  --  1.8   Liver Function Tests: Recent Labs  Lab 04/27/18 1510  AST 21  ALT 20  ALKPHOS 95  BILITOT 1.2  PROT 7.1  ALBUMIN 3.5   Recent Labs  Lab 04/27/18 1510  LIPASE 24   No results for input(s): AMMONIA in the last 168 hours. CBC: Recent Labs  Lab 04/27/18 1510 04/28/18 0517 04/29/18 0458  WBC 5.2 6.5 4.8  NEUTROABS 3.2 4.1  --   HGB 10.1* 10.2* 10.4*  HCT 30.3* 31.7* 32.3*  MCV 92.1 94.3 92.8  PLT 179 199 180   Cardiac Enzymes: Recent Labs  Lab 04/27/18 1510 04/27/18 2314  TROPONINI 0.05* 0.05*   BNP: BNP (last 3 results) Recent Labs    04/27/18 1510  BNP 1,976.9*    ProBNP (last 3 results) No results for input(s): PROBNP in the last 8760 hours.  CBG: No results for input(s): GLUCAP in the last 168 hours.     Signed:  Irine Seal MD.  [facility] 04/29/2018, 4:20 PM

## 2018-04-29 NOTE — Progress Notes (Signed)
SATURATION QUALIFICATIONS: (This note is used to comply with regulatory documentation for home oxygen)  Patient Saturations on Room Air at Rest =99%  Patient Saturations on Room Air while Ambulating =96%  Patient Saturations on 0 Liters of oxygen while Ambulating =96%  Please briefly explain why patient needs home oxygen: 

## 2018-05-01 LAB — CULTURE, RESPIRATORY W GRAM STAIN

## 2018-05-01 LAB — CULTURE, RESPIRATORY: CULTURE: NORMAL

## 2018-05-02 LAB — CULTURE, BLOOD (ROUTINE X 2)
CULTURE: NO GROWTH
Culture: NO GROWTH
Special Requests: ADEQUATE
Special Requests: ADEQUATE

## 2019-07-12 IMAGING — DX DG CHEST 2V
2 series · 2 of 2 positions shown · non-contrast
Comparison: None.

CLINICAL DATA: Cough and congestion with shortness of breath.
History of leukemia

EXAM:
CHEST - 2 VIEW

[chest pa]
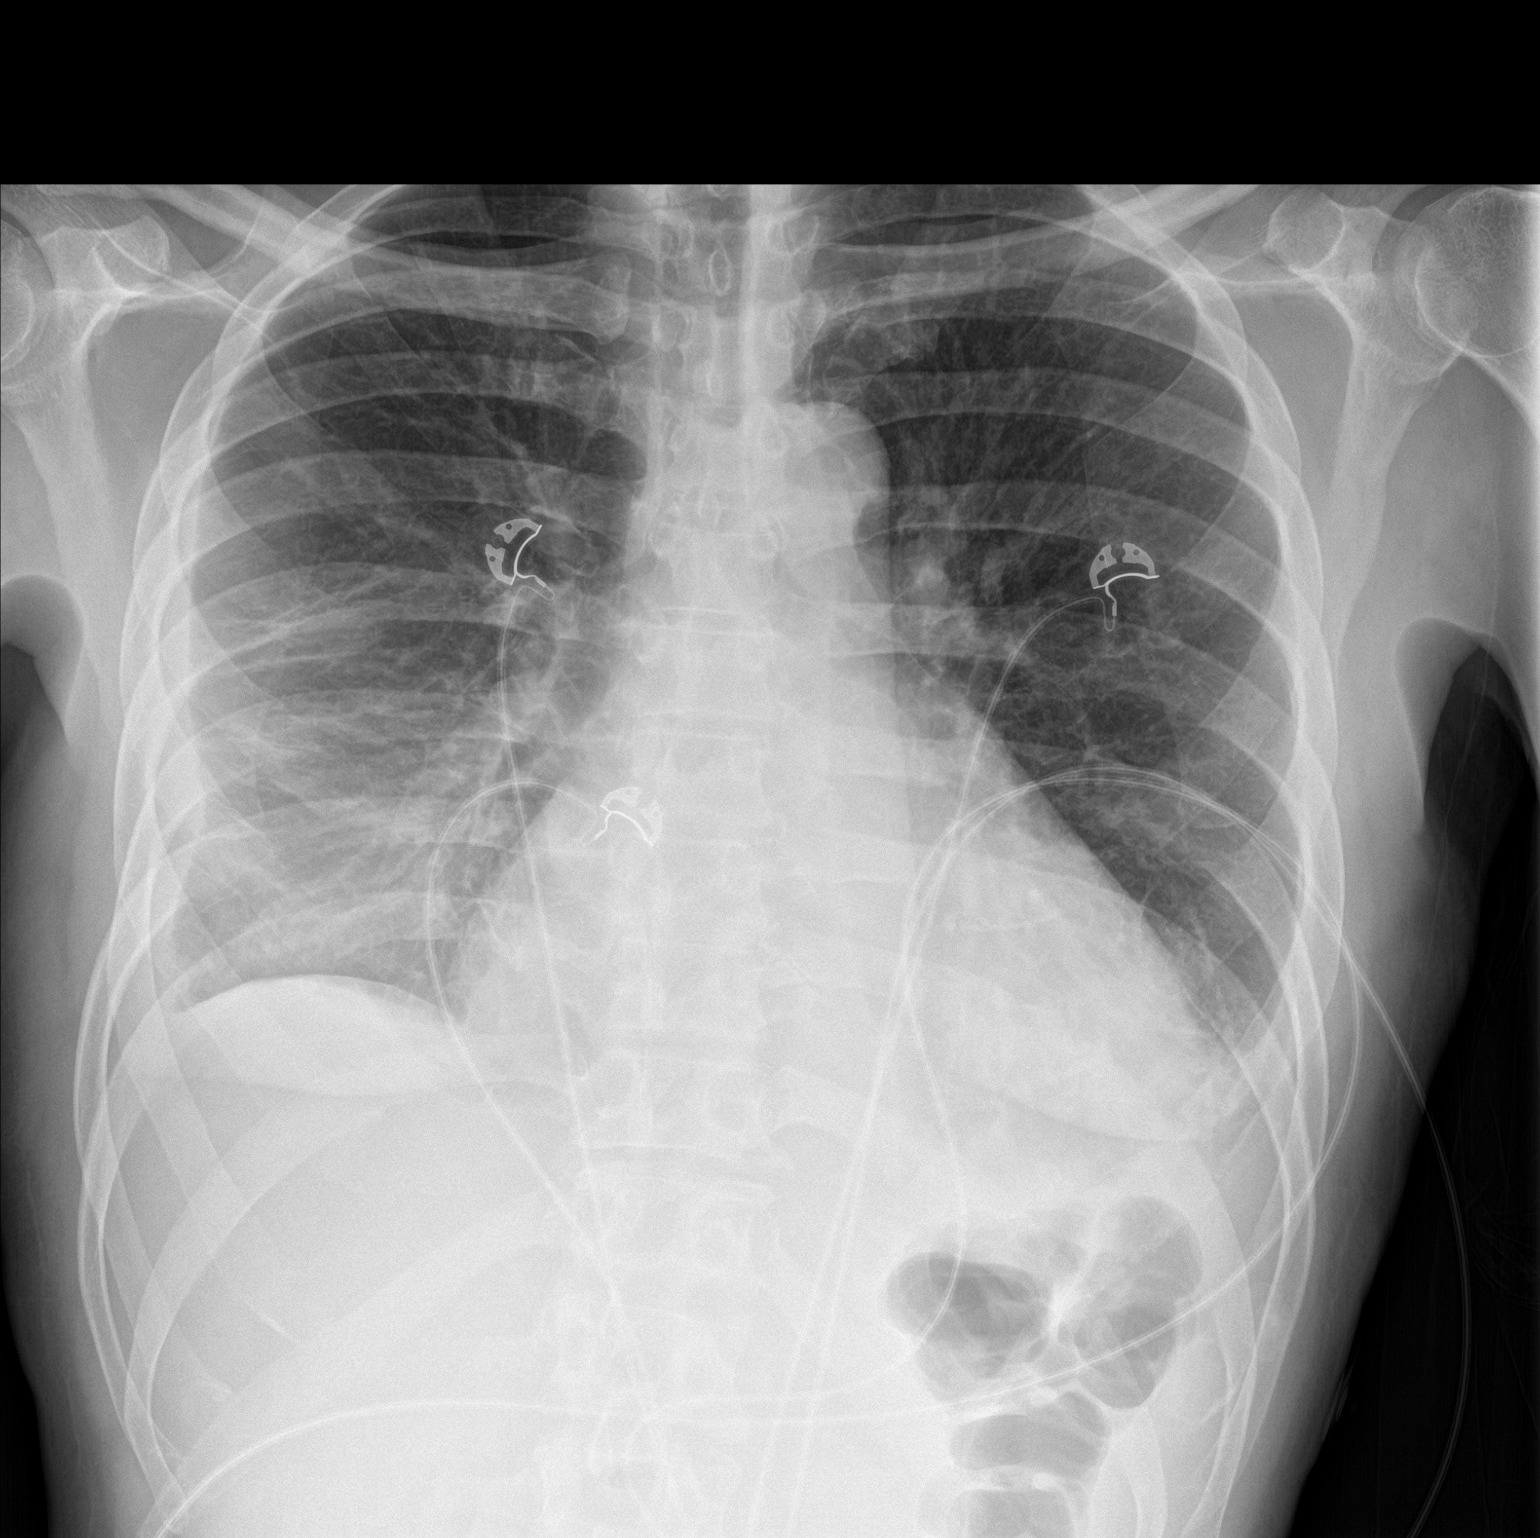

[chest lat]
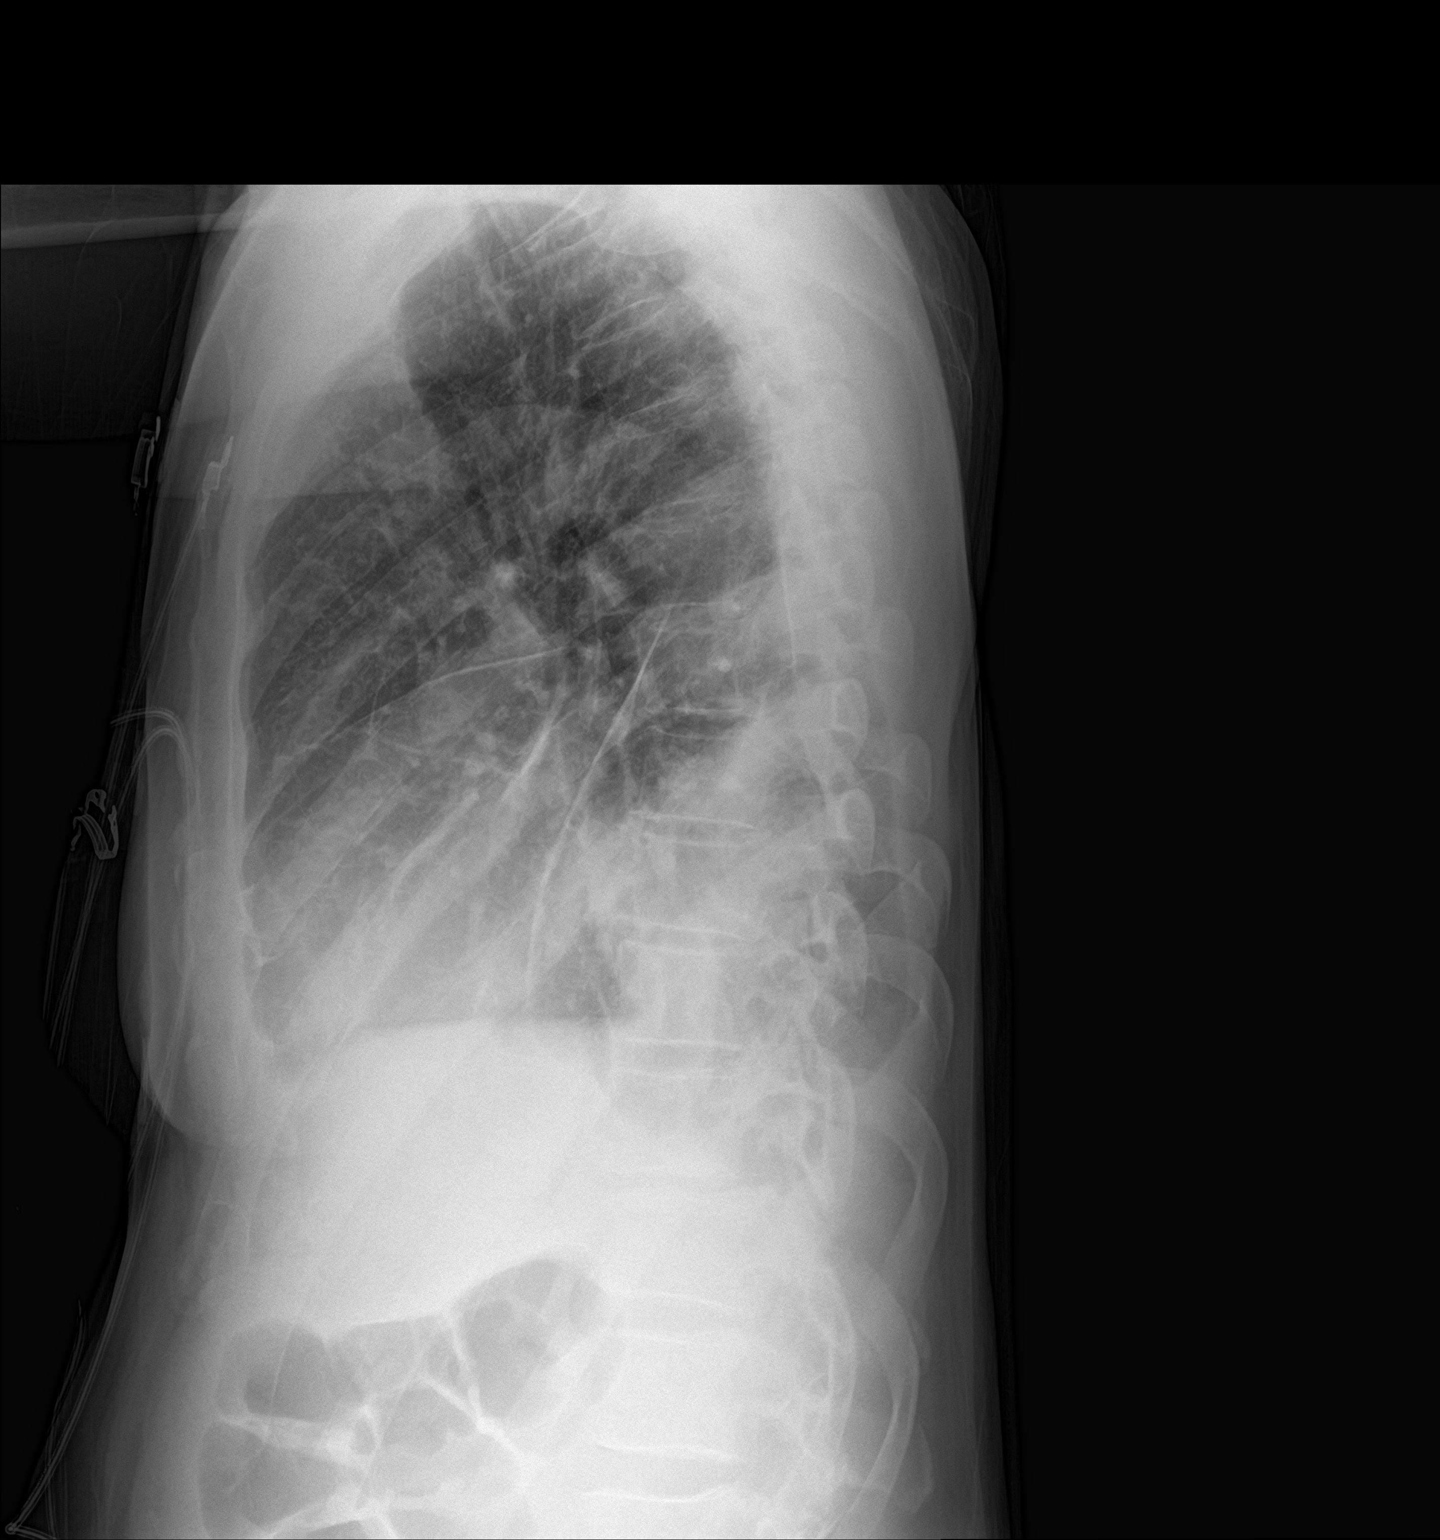

[2 of 2 positions shown; findings below may reference images not displayed]

FINDINGS: There is airspace consolidation in the left lower lobe with small
left pleural effusion. There is more hazy opacity throughout
portions of the right middle and lower lobes. Suspect mild loculated
effusion on the right. There is cardiomegaly with pulmonary
vascularity normal. No adenopathy. No evident bone lesions.
IMPRESSION: Multifocal pneumonia. Small left pleural effusion. There may be mild
loculated effusion on the right.

There is cardiomegaly.  No adenopathy evident.

## 2019-07-12 IMAGING — CT CT ANGIO CHEST
2 of 8 series · 19 of 36 positions shown · IV contrast (iopamidol)
Comparison: Plain film from earlier in the same day.

CLINICAL DATA: History of leukemia with shortness of breath,
initial encounter

EXAM:
CT ANGIOGRAPHY CHEST WITH CONTRAST
TECHNIQUE: Multidetector CT imaging of the chest was performed using the
standard protocol during bolus administration of intravenous
contrast. Multiplanar CT image reconstructions and MIPs were
obtained to evaluate the vascular anatomy.
CONTRAST:  100mL [id number] IOPAMIDOL ([id number]) INJECTION 76%

[Series 8: pe thins · axial · 0.68mm/px · z∈[-264,+23]mm · 18 of 321 slices shown]
[im 17/321  lung]
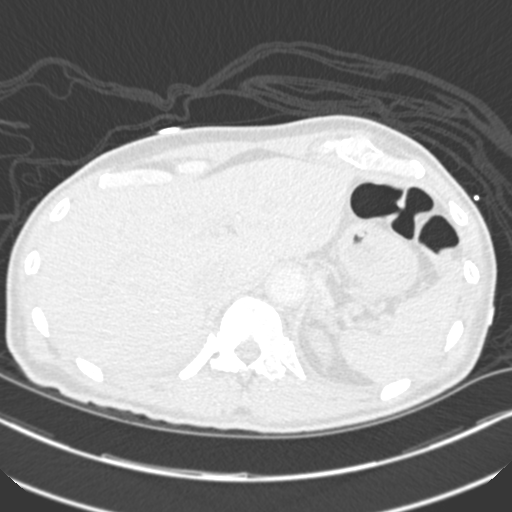
[im 34/321  mediastinal]
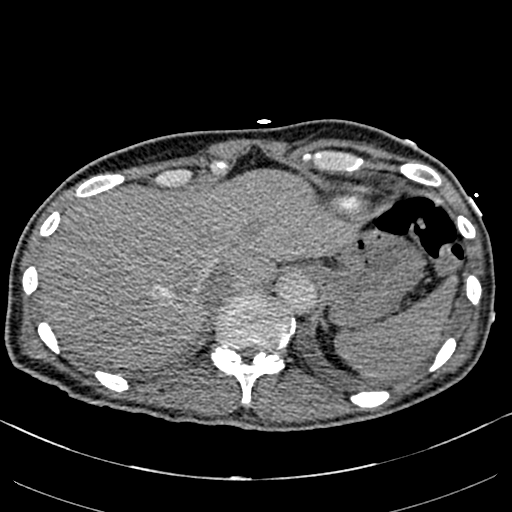
[im 51/321  lung]
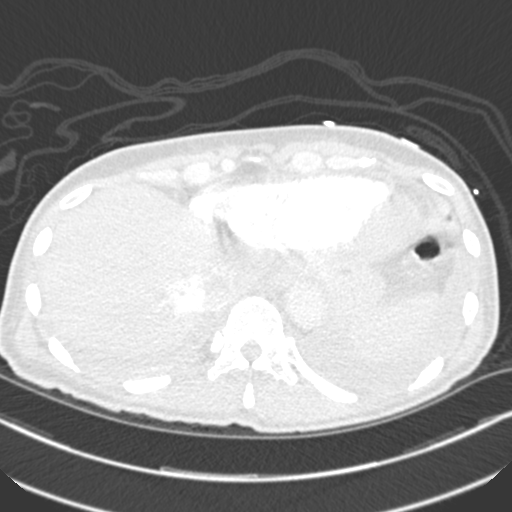
[im 68/321  mediastinal]
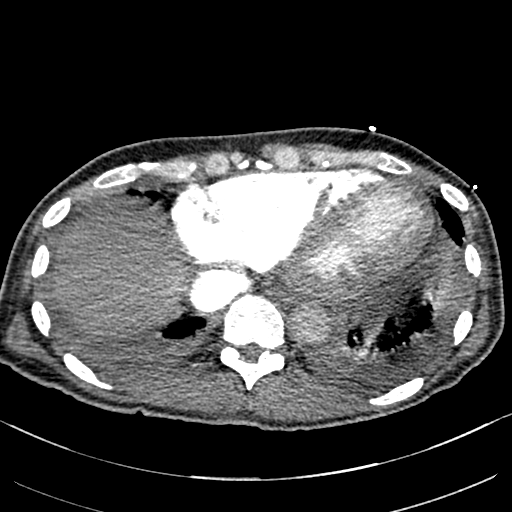
[im 85/321  lung]
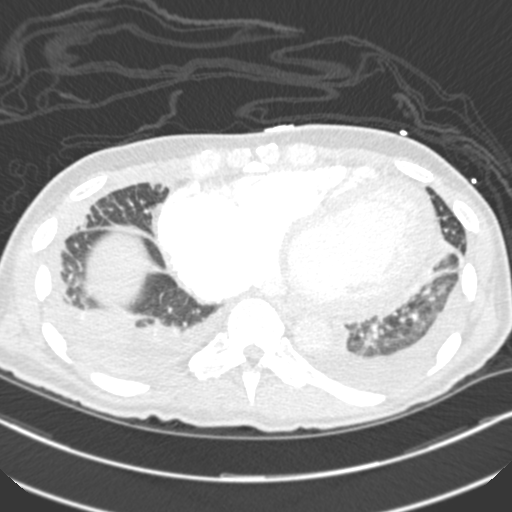
[im 102/321  mediastinal]
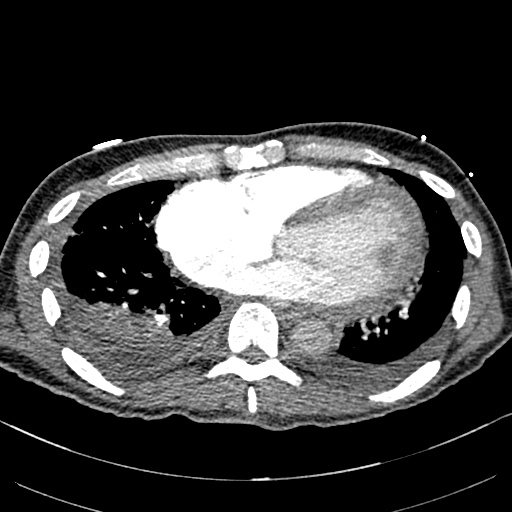
[im 118/321  lung]
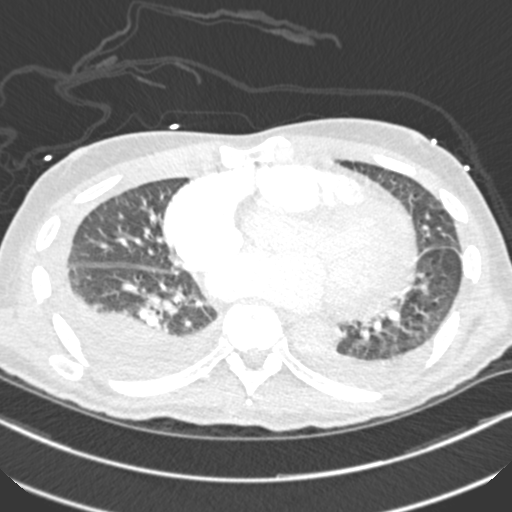
[im 135/321  mediastinal]
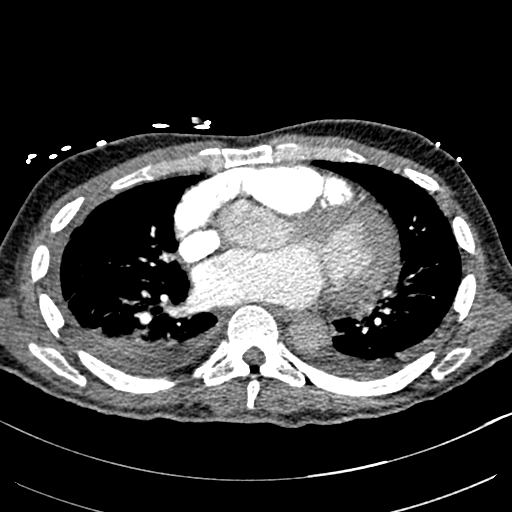
[im 152/321  lung]
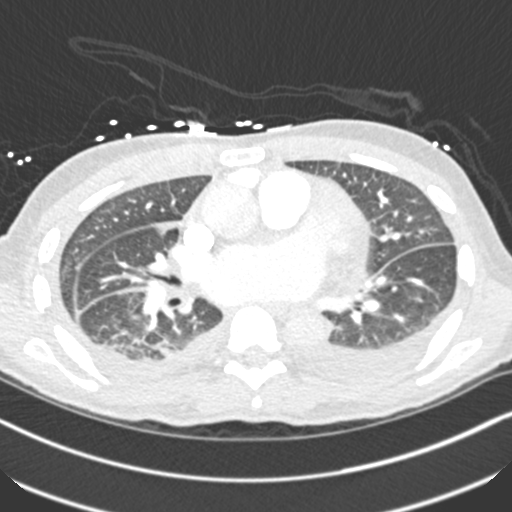
[im 169/321  mediastinal]
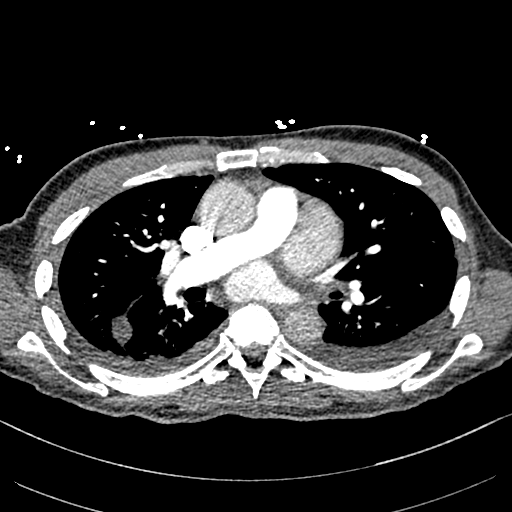
[im 186/321  lung]
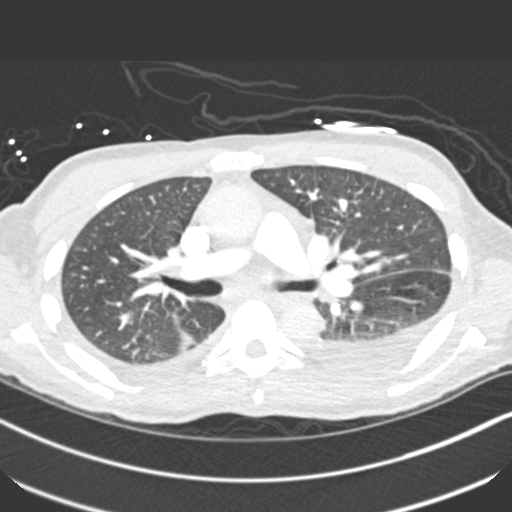
[im 203/321  mediastinal]
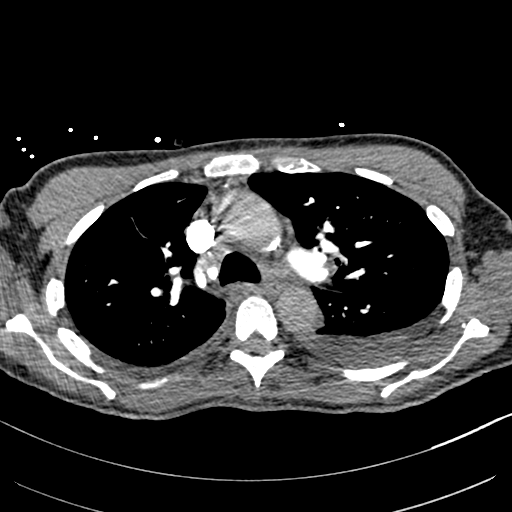
[im 219/321  lung]
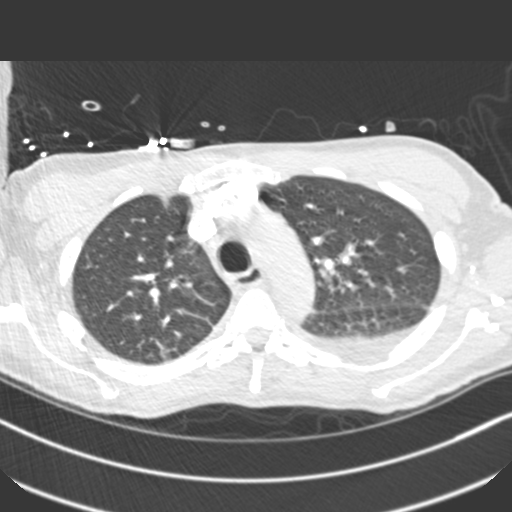
[im 236/321  mediastinal]
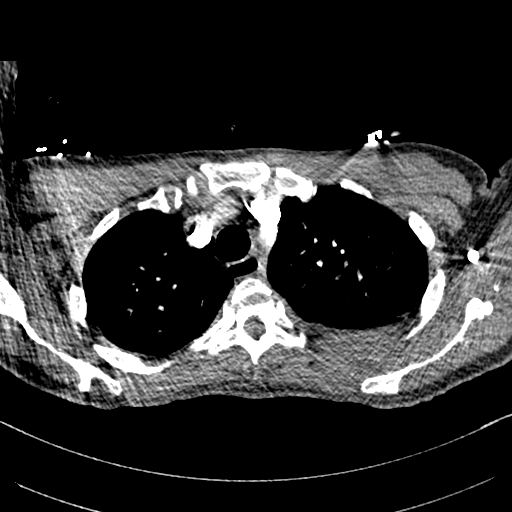
[im 253/321  lung]
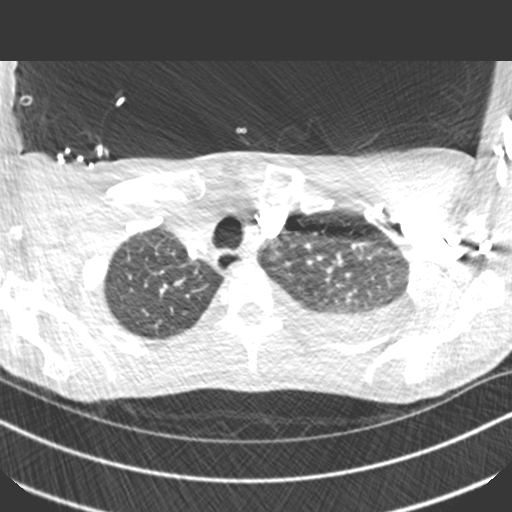
[im 270/321  mediastinal]
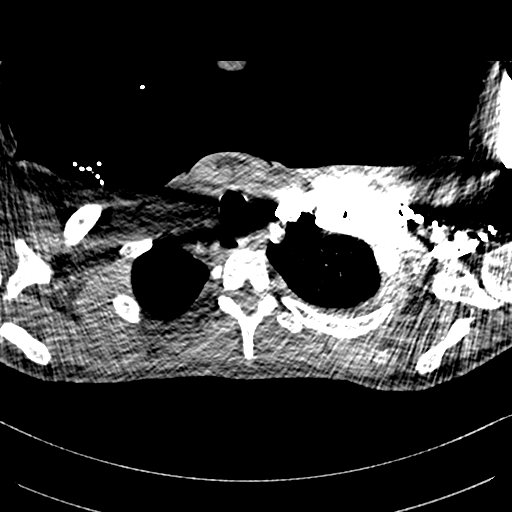
[im 287/321  lung]
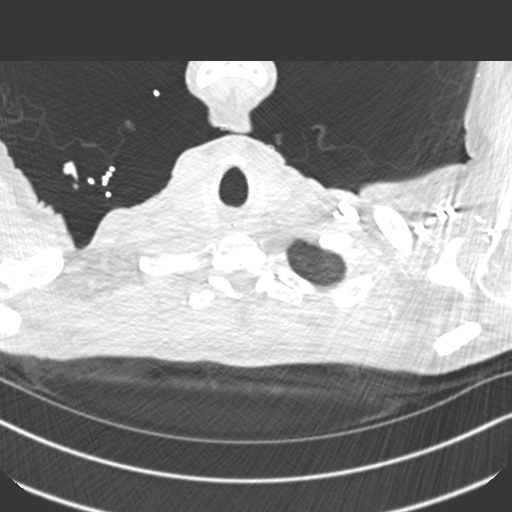
[im 304/321  mediastinal]
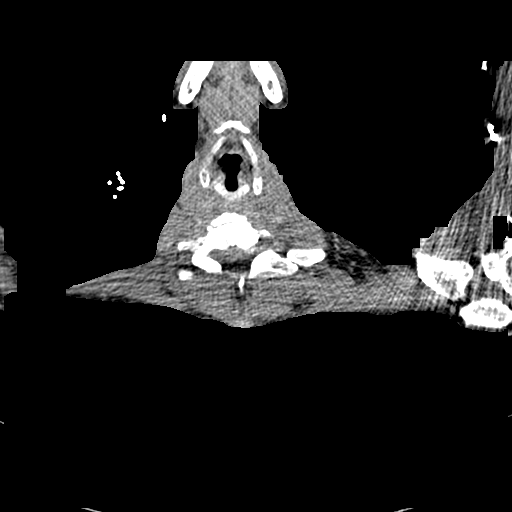

[Series 9: pe coronal mpr · coronal · 0.62mm/px · 1 of 98 slices shown]
[im 49/98  mediastinal]
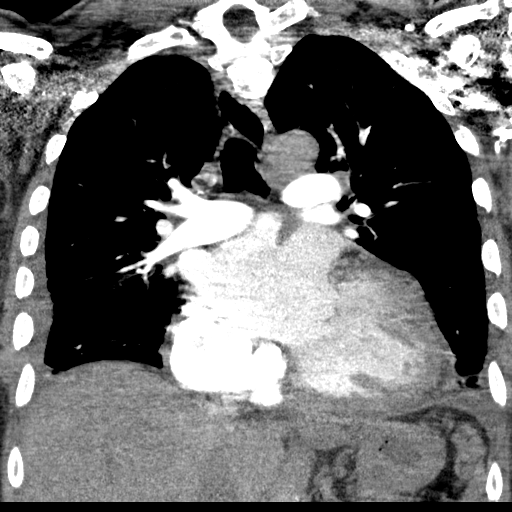

[19 of 36 positions shown; findings below may reference images not displayed]

FINDINGS: Cardiovascular: Thoracic aorta demonstrates atherosclerotic
calcifications without aneurysmal dilatation or dissection. The
pulmonary artery shows a normal branching pattern. No filling
defects to suggest pulmonary embolism are seen. Mild cardiomegaly is
noted. Mild coronary calcifications are seen.

Mediastinum/Nodes: Thoracic inlet is within normal limits. No
significant hilar or mediastinal adenopathy is noted. The esophagus
as visualized is within normal limits.

Lungs/Pleura: Bilateral pleural effusions are noted left slightly
greater than right. Some associated right lower lobe infiltrate is
noted. Fluid is also noted within the major fissure. No focal
nodularity is noted.

Upper Abdomen: Within normal limits.

Musculoskeletal: Within normal limits.

Review of the MIP images confirms the above findings.
IMPRESSION: No evidence of pulmonary emboli.

Bilateral pleural effusions are noted left greater than right with
evidence of right lower lobe infiltrate.

Aortic Atherosclerosis ([id number]-[id number].R).

## 2020-12-18 ENCOUNTER — Inpatient Hospital Stay (HOSPITAL_BASED_OUTPATIENT_CLINIC_OR_DEPARTMENT_OTHER)
Admission: EM | Admit: 2020-12-18 | Discharge: 2020-12-21 | DRG: 638 | Disposition: A | Discharge status: Home / Self Care | Payer: Medicare Other | Attending: Family Medicine | Admitting: Family Medicine

## 2020-12-18 ENCOUNTER — Other Ambulatory Visit: Payer: Self-pay

## 2020-12-18 ENCOUNTER — Encounter (HOSPITAL_BASED_OUTPATIENT_CLINIC_OR_DEPARTMENT_OTHER): Payer: Self-pay | Admitting: *Deleted

## 2020-12-18 DIAGNOSIS — R634 Abnormal weight loss: Secondary | ICD-10-CM

## 2020-12-18 DIAGNOSIS — E1110 Type 2 diabetes mellitus with ketoacidosis without coma: Secondary | ICD-10-CM | POA: Diagnosis present

## 2020-12-18 DIAGNOSIS — Z9221 Personal history of antineoplastic chemotherapy: Secondary | ICD-10-CM | POA: Diagnosis not present

## 2020-12-18 DIAGNOSIS — I480 Paroxysmal atrial fibrillation: Secondary | ICD-10-CM | POA: Diagnosis present

## 2020-12-18 DIAGNOSIS — E1100 Type 2 diabetes mellitus with hyperosmolarity without nonketotic hyperglycemic-hyperosmolar coma (NKHHC): Secondary | ICD-10-CM | POA: Diagnosis present

## 2020-12-18 DIAGNOSIS — E871 Hypo-osmolality and hyponatremia: Secondary | ICD-10-CM | POA: Diagnosis not present

## 2020-12-18 DIAGNOSIS — Z8249 Family history of ischemic heart disease and other diseases of the circulatory system: Secondary | ICD-10-CM | POA: Diagnosis not present

## 2020-12-18 DIAGNOSIS — Z79899 Other long term (current) drug therapy: Secondary | ICD-10-CM | POA: Diagnosis not present

## 2020-12-18 DIAGNOSIS — E1165 Type 2 diabetes mellitus with hyperglycemia: Secondary | ICD-10-CM | POA: Diagnosis present

## 2020-12-18 DIAGNOSIS — Z20822 Contact with and (suspected) exposure to covid-19: Secondary | ICD-10-CM | POA: Diagnosis present

## 2020-12-18 DIAGNOSIS — E119 Type 2 diabetes mellitus without complications: Secondary | ICD-10-CM | POA: Diagnosis not present

## 2020-12-18 DIAGNOSIS — Z833 Family history of diabetes mellitus: Secondary | ICD-10-CM | POA: Diagnosis not present

## 2020-12-18 DIAGNOSIS — E876 Hypokalemia: Secondary | ICD-10-CM | POA: Diagnosis present

## 2020-12-18 DIAGNOSIS — I5022 Chronic systolic (congestive) heart failure: Secondary | ICD-10-CM | POA: Diagnosis present

## 2020-12-18 DIAGNOSIS — Z7982 Long term (current) use of aspirin: Secondary | ICD-10-CM | POA: Diagnosis not present

## 2020-12-18 DIAGNOSIS — N179 Acute kidney failure, unspecified: Secondary | ICD-10-CM | POA: Diagnosis present

## 2020-12-18 DIAGNOSIS — I110 Hypertensive heart disease with heart failure: Secondary | ICD-10-CM | POA: Diagnosis present

## 2020-12-18 DIAGNOSIS — E860 Dehydration: Secondary | ICD-10-CM | POA: Diagnosis present

## 2020-12-18 DIAGNOSIS — I5023 Acute on chronic systolic (congestive) heart failure: Secondary | ICD-10-CM | POA: Diagnosis present

## 2020-12-18 DIAGNOSIS — C9201 Acute myeloblastic leukemia, in remission: Secondary | ICD-10-CM | POA: Diagnosis present

## 2020-12-18 DIAGNOSIS — R531 Weakness: Secondary | ICD-10-CM

## 2020-12-18 LAB — COMPREHENSIVE METABOLIC PANEL
ALT: 18 U/L (ref 0–44)
AST: 17 U/L (ref 15–41)
Albumin: 3.9 g/dL (ref 3.5–5.0)
Alkaline Phosphatase: 96 U/L (ref 38–126)
Anion gap: 17 — ABNORMAL HIGH (ref 5–15)
BUN: 39 mg/dL — ABNORMAL HIGH (ref 8–23)
CO2: 28 mmol/L (ref 22–32)
Calcium: 9.3 mg/dL (ref 8.9–10.3)
Chloride: 78 mmol/L — ABNORMAL LOW (ref 98–111)
Creatinine, Ser: 1.9 mg/dL — ABNORMAL HIGH (ref 0.61–1.24)
GFR, Estimated: 39 mL/min — ABNORMAL LOW (ref >60)
Glucose, Bld: 1070 mg/dL (ref 70–99)
Potassium: 4.6 mmol/L (ref 3.5–5.1)
Sodium: 123 mmol/L — ABNORMAL LOW (ref 135–145)
Total Bilirubin: 1.4 mg/dL — ABNORMAL HIGH (ref 0.3–1.2)
Total Protein: 7.8 g/dL (ref 6.5–8.1)

## 2020-12-18 LAB — URINALYSIS, MICROSCOPIC (REFLEX)

## 2020-12-18 LAB — CBG MONITORING, ED
Glucose-Capillary: >600 mg/dL (ref 70–99)
Glucose-Capillary: 553 mg/dL (ref 70–99)

## 2020-12-18 LAB — URINALYSIS, ROUTINE W REFLEX MICROSCOPIC
Bilirubin Urine: NEGATIVE
Glucose, UA: >=500 mg/dL — AB
Ketones, ur: 15 mg/dL — AB
Leukocytes,Ua: NEGATIVE
Nitrite: NEGATIVE
Protein, ur: NEGATIVE mg/dL
Specific Gravity, Urine: 1.005 (ref 1.005–1.030)
pH: 7 (ref 5.0–8.0)

## 2020-12-18 LAB — I-STAT VENOUS BLOOD GAS, ED
Acid-Base Excess: 7 mmol/L — ABNORMAL HIGH (ref 0.0–2.0)
Bicarbonate: 34 mmol/L — ABNORMAL HIGH (ref 20.0–28.0)
Calcium, Ion: 1.13 mmol/L — ABNORMAL LOW (ref 1.15–1.40)
HCT: 41 % (ref 39.0–52.0)
Hemoglobin: 13.9 g/dL (ref 13.0–17.0)
O2 Saturation: 28 %
Potassium: 4.2 mmol/L (ref 3.5–5.1)
Sodium: 126 mmol/L — ABNORMAL LOW (ref 135–145)
TCO2: 36 mmol/L — ABNORMAL HIGH (ref 22–32)
pCO2, Ven: 58.9 mmHg (ref 44.0–60.0)
pH, Ven: 7.37 (ref 7.250–7.430)
pO2, Ven: 20 mmHg — CL (ref 32.0–45.0)

## 2020-12-18 LAB — CBC WITH DIFFERENTIAL/PLATELET
Abs Immature Granulocytes: 0.02 10*3/uL (ref 0.00–0.07)
Basophils Absolute: 0 10*3/uL (ref 0.0–0.1)
Basophils Relative: 1 %
Eosinophils Absolute: 0 10*3/uL (ref 0.0–0.5)
Eosinophils Relative: 1 %
HCT: 41.2 % (ref 39.0–52.0)
Hemoglobin: 13.7 g/dL (ref 13.0–17.0)
Immature Granulocytes: 0 %
Lymphocytes Relative: 16 %
Lymphs Abs: 1.1 10*3/uL (ref 0.7–4.0)
MCH: 29.5 pg (ref 26.0–34.0)
MCHC: 33.3 g/dL (ref 30.0–36.0)
MCV: 88.6 fL (ref 80.0–100.0)
Monocytes Absolute: 0.5 10*3/uL (ref 0.1–1.0)
Monocytes Relative: 8 %
Neutro Abs: 4.7 10*3/uL (ref 1.7–7.7)
Neutrophils Relative %: 74 %
Platelets: 187 10*3/uL (ref 150–400)
RBC: 4.65 MIL/uL (ref 4.22–5.81)
RDW: 12.2 % (ref 11.5–15.5)
WBC: 6.4 10*3/uL (ref 4.0–10.5)
nRBC: 0 % (ref 0.0–0.2)

## 2020-12-18 LAB — BASIC METABOLIC PANEL
Anion gap: 13 (ref 5–15)
BUN: 37 mg/dL — ABNORMAL HIGH (ref 8–23)
CO2: 33 mmol/L — ABNORMAL HIGH (ref 22–32)
Calcium: 10.1 mg/dL (ref 8.9–10.3)
Chloride: 89 mmol/L — ABNORMAL LOW (ref 98–111)
Creatinine, Ser: 1.68 mg/dL — ABNORMAL HIGH (ref 0.61–1.24)
GFR, Estimated: 46 mL/min — ABNORMAL LOW (ref >60)
Glucose, Bld: 389 mg/dL — ABNORMAL HIGH (ref 70–99)
Potassium: 3.7 mmol/L (ref 3.5–5.1)
Sodium: 135 mmol/L (ref 135–145)

## 2020-12-18 LAB — GLUCOSE, CAPILLARY
Glucose-Capillary: 390 mg/dL — ABNORMAL HIGH (ref 70–99)
Glucose-Capillary: 299 mg/dL — ABNORMAL HIGH (ref 70–99)
Glucose-Capillary: 188 mg/dL — ABNORMAL HIGH (ref 70–99)

## 2020-12-18 LAB — MAGNESIUM: Magnesium: 2.2 mg/dL (ref 1.7–2.4)

## 2020-12-18 LAB — SARS CORONAVIRUS 2 BY RT PCR (HOSPITAL ORDER, PERFORMED IN ~~LOC~~ HOSPITAL LAB): SARS Coronavirus 2: NEGATIVE

## 2020-12-18 LAB — MRSA PCR SCREENING: MRSA by PCR: NEGATIVE

## 2020-12-18 LAB — TSH: TSH: 0.369 u[IU]/mL (ref 0.350–4.500)

## 2020-12-18 MED ORDER — INSULIN ASPART 100 UNIT/ML IV SOLN
10.0000 [IU] | Freq: Once | INTRAVENOUS | Status: DC
  Filled 2020-12-18: qty 0.1

## 2020-12-18 MED ORDER — ENOXAPARIN SODIUM 40 MG/0.4ML ~~LOC~~ SOLN
40.0000 mg | SUBCUTANEOUS | Status: DC
  Administered 2020-12-18 – 2020-12-20 (×3): 40 mg via SUBCUTANEOUS
  Filled 2020-12-18 (×3): qty 0.4

## 2020-12-18 MED ORDER — INSULIN REGULAR(HUMAN) IN NACL 100-0.9 UT/100ML-% IV SOLN
INTRAVENOUS | Status: DC
  Administered 2020-12-18: 13 [IU]/h via INTRAVENOUS
  Filled 2020-12-18: qty 100

## 2020-12-18 MED ORDER — FAMOTIDINE 20 MG PO TABS
20.0000 mg | ORAL_TABLET | Freq: Two times a day (BID) | ORAL | Status: DC
  Administered 2020-12-18 – 2020-12-21 (×6): 20 mg via ORAL
  Filled 2020-12-18 (×7): qty 1

## 2020-12-18 MED ORDER — CARVEDILOL 3.125 MG PO TABS
3.1250 mg | ORAL_TABLET | Freq: Two times a day (BID) | ORAL | Status: DC
Start: 2020-12-19 — End: 2020-12-21
  Administered 2020-12-19 – 2020-12-21 (×5): 3.125 mg via ORAL
  Filled 2020-12-18 (×5): qty 1

## 2020-12-18 MED ORDER — ASPIRIN 81 MG PO CHEW
81.0000 mg | CHEWABLE_TABLET | Freq: Every day | ORAL | Status: DC
  Administered 2020-12-19 – 2020-12-21 (×3): 81 mg via ORAL
  Filled 2020-12-18 (×3): qty 1

## 2020-12-18 MED ORDER — CHLORHEXIDINE GLUCONATE CLOTH 2 % EX PADS
6.0000 | MEDICATED_PAD | Freq: Every day | CUTANEOUS | Status: DC
  Administered 2020-12-18: 6 via TOPICAL

## 2020-12-18 MED ORDER — INSULIN REGULAR(HUMAN) IN NACL 100-0.9 UT/100ML-% IV SOLN: INTRAVENOUS | Status: DC

## 2020-12-18 MED ORDER — ACETAMINOPHEN 500 MG PO TABS
500.0000 mg | ORAL_TABLET | Freq: Four times a day (QID) | ORAL | Status: DC | PRN
Start: 2020-12-18 — End: 2020-12-21
  Administered 2020-12-18 – 2020-12-20 (×3): 1000 mg via ORAL
  Filled 2020-12-18 (×3): qty 2

## 2020-12-18 MED ORDER — DEXTROSE 50 % IV SOLN: 0.0000 mL | INTRAVENOUS | Status: DC | PRN

## 2020-12-18 MED ORDER — SODIUM CHLORIDE 0.9 % IV BOLUS
500.0000 mL | Freq: Once | INTRAVENOUS | Status: AC
  Administered 2020-12-18: 500 mL via INTRAVENOUS

## 2020-12-18 MED ORDER — INSULIN ASPART 100 UNIT/ML ~~LOC~~ SOLN
SUBCUTANEOUS | Status: AC
  Filled 2020-12-18: qty 6

## 2020-12-18 MED ORDER — DEXTROSE IN LACTATED RINGERS 5 % IV SOLN: INTRAVENOUS | Status: DC

## 2020-12-18 MED ORDER — INSULIN ASPART 100 UNIT/ML IV SOLN
6.0000 [IU] | Freq: Once | INTRAVENOUS | Status: AC
  Administered 2020-12-18: 6 [IU] via INTRAVENOUS
  Filled 2020-12-18: qty 0.06

## 2020-12-18 MED ORDER — LACTATED RINGERS IV SOLN: INTRAVENOUS | Status: DC

## 2020-12-18 NOTE — ED Notes (Signed)
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

## 2020-12-18 NOTE — H&P (Signed)
History and Physical    Brendan Ellis [id number] DOB: July 08, 1956 DOA: 12/18/2020  PCP: Robert Bellow, PA-C  Patient coming from: Home  I have personally briefly reviewed patient's old medical records in [place]  Chief Complaint: Confused, forgetful  HPI: Brendan Ellis is a 65 y.o. 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  Pt presents to ED with generalized weakness, fuzzy headed, wt loss.  The wt loss is intentional: 30lbs over past month, has been exercising and trying the "keto diet".  Apparently owns workout gym.  Daughter noticed he was a little confused and more forgetful recently.  Pt has increased urination.  Denies headache, unilateral weakness.  No h/o DM.   ED Course: New onset DM with BGL of 1070, AKI with creat 1.9 (up from 1.1 in April 21)  BUN 39.  Pt given 500cc bolus and started on insulin gtt.   Review of Systems: As per HPI, otherwise all review of systems negative.  Past Medical History:  Diagnosis Date  . Hypertension   . Leukemia ([place])   . PAF (paroxysmal atrial fibrillation) ([place]) 04/27/2018    History reviewed. No pertinent surgical history.   reports that he has never smoked. He has never used smokeless tobacco. He reports that he does not drink alcohol and does not use drugs.  No Known Allergies  Family History  Problem Relation Age of Onset  . Hypertension Mother   . Diabetes Mother      Prior to Admission medications   Medication Sig Start Date End Date Taking? Authorizing Provider  aspirin 81 MG chewable tablet Chew 1 tablet (81 mg total) by mouth daily. 04/30/18  Yes Eugenie Filler, MD  carvedilol (COREG) 3.125 MG tablet Take 3.125 mg by mouth 2 (two) times daily with a meal. (BP > 120 Take 2 tablets)   Yes [provider]  furosemide (LASIX) 40 MG tablet Take 40 mg by mouth daily.    Yes [provider]  spironolactone  (ALDACTONE) 25 MG tablet Take 1 tablet by mouth daily. 04/08/20 04/08/21 Yes [provider]  acetaminophen (TYLENOL) 500 MG tablet Take 500-1,000 mg by mouth every 6 (six) hours as needed for moderate pain.    [provider]  dronabinol (MARINOL) 2.5 MG capsule Take 2.5 mg by mouth 3 (three) times daily.    [provider]  famotidine (PEPCID) 20 MG tablet Take 20 mg by mouth 2 (two) times daily.     [provider]  fluconazole (DIFLUCAN) 200 MG tablet Take 200 mg by mouth.  04/03/18   [provider]  lisinopril (PRINIVIL,ZESTRIL) 2.5 MG tablet Take 1 tablet (2.5 mg total) by mouth daily as needed (BP >130). 04/29/18   Eugenie Filler, MD  magnesium oxide (MAG-OX) 400 MG tablet Take 400 mg by mouth 3 (three) times daily.    [provider]  potassium chloride SA (KLOR-CON M15) 15 MEQ tablet Take 30 mEq by mouth 3 (three) times daily.    [provider]    Physical Exam: Vitals:   12/18/20 1930 12/18/20 2005 12/18/20 2010 12/18/20 2113  BP: 108/76 110/87  121/74  Pulse: 71 75  79  Resp: 20 15  15   Temp:   98.2 F (36.8 C) 98.5 F (36.9 C)  TempSrc:   Oral Oral  SpO2: 100% 99%  98%  Weight:    95.9 kg  Height:  6\' 1"  (1.854 m)    Constitutional: NAD, calm, comfortable Eyes: PERRL, lids and conjunctivae normal ENMT: Mucous membranes are moist. Posterior pharynx clear of any exudate or lesions.Normal dentition.  Neck: normal, supple, no masses, no thyromegaly Respiratory: clear to auscultation bilaterally, no wheezing, no crackles. Normal respiratory effort. No accessory muscle use.  Cardiovascular: Regular rate and rhythm, no murmurs / rubs / gallops. No extremity edema. 2+ pedal pulses. No carotid bruits.  Abdomen: no tenderness, no masses palpated. No hepatosplenomegaly. Bowel sounds positive.  Musculoskeletal: no clubbing / cyanosis. No joint deformity upper and lower extremities. Good ROM, no contractures. Normal  muscle tone.  Skin: no rashes, lesions, ulcers. No induration Neurologic: CN 2-12 grossly intact. Sensation intact, DTR normal. Strength 5/5 in all 4.  Psychiatric: Normal judgment and insight. Alert and oriented x 3. Normal mood.    Labs on Admission: I have personally reviewed following labs and imaging studies  CBC: Recent Labs  Lab 12/18/20 1404 12/18/20 1526  WBC 6.4  --   NEUTROABS 4.7  --   HGB 13.7 13.9  HCT 41.2 41.0  MCV 88.6  --   PLT 187  --    Basic Metabolic Panel: Recent Labs  Lab 12/18/20 1404 12/18/20 1423 12/18/20 1526  NA 123*  --  126*  K 4.6  --  4.2  CL 78*  --   --   CO2 28  --   --   GLUCOSE 1,070*  --   --   BUN 39*  --   --   CREATININE 1.90*  --   --   CALCIUM 9.3  --   --   MG  --  2.2  --    GFR: Estimated Creatinine Clearance: 49.2 mL/min (A) (by C-G formula based on SCr of 1.9 mg/dL (H)). Liver Function Tests: Recent Labs  Lab 12/18/20 1404  AST 17  ALT 18  ALKPHOS 96  BILITOT 1.4*  PROT 7.8  ALBUMIN 3.9   No results for input(s): LIPASE, AMYLASE in the last 168 hours. No results for input(s): AMMONIA in the last 168 hours. Coagulation Profile: No results for input(s): INR, PROTIME in the last 168 hours. Cardiac Enzymes: No results for input(s): CKTOTAL, CKMB, CKMBINDEX, TROPONINI in the last 168 hours. BNP (last 3 results) No results for input(s): PROBNP in the last 8760 hours. HbA1C: No results for input(s): HGBA1C in the last 72 hours. CBG: Recent Labs  Lab 12/18/20 1923 12/18/20 2004  GLUCAP >600* 553*   Lipid Profile: No results for input(s): CHOL, HDL, LDLCALC, TRIG, CHOLHDL, LDLDIRECT in the last 72 hours. Thyroid Function Tests: Recent Labs    12/18/20 1404  TSH 0.369   Anemia Panel: No results for input(s): VITAMINB12, FOLATE, FERRITIN, TIBC, IRON, RETICCTPCT in the last 72 hours. Urine analysis:    Component Value Date/Time   COLORURINE STRAW (A) 12/18/2020 1318   APPEARANCEUR CLEAR 12/18/2020  1318   LABSPEC 1.005 12/18/2020 1318   PHURINE 7.0 12/18/2020 1318   GLUCOSEU >=500 (A) 12/18/2020 1318   HGBUR SMALL (A) 12/18/2020 1318   BILIRUBINUR NEGATIVE 12/18/2020 1318   KETONESUR 15 (A) 12/18/2020 1318   PROTEINUR NEGATIVE 12/18/2020 1318   NITRITE NEGATIVE 12/18/2020 1318   LEUKOCYTESUR NEGATIVE 12/18/2020 1318    Radiological Exams on Admission: No results found.  EKG: Independently reviewed.  Assessment/Plan Principal Problem:   Hyperosmolar hyperglycemic state (HHS) ([place]) Active Problems:   Chronic systolic CHF (congestive heart failure) ([facility])   AML (acute myeloid leukemia)  in remission [facility])   New onset type 2 diabetes mellitus ([place])   AKI (acute kidney injury) ([place])    1. HHS - 1. Insulin gtt per diabetic crisis pathway 2. IVF: 500cc bolus in ED, will give another 500cc bolus + 125 cc/hr LR 1. Clinically pt dry with pre-renal azotemia / AKI 3. BMP Q4H 2. AKI - 1. Pre-renal / dehydration due to HHS 2. IVF as above 3. Strict intake and output 4. Trend BMPs 5. Hold ACEi 6. Hold diuretics 3. HFrEF - 1. Hold diuretics and ACEi 2. Cont ASA 3. Cont BB 4. Again, clinically patient dehydrated as above 5. Using reduced bolus volume (1L total bolus) so as to try and not fluid overload pt. 4. AML - in remission (as far as we know) 1. CBC looks good 2. Wt loss is supposedly intentional over past 1 month + the new onset DM no doubt contributing. 3. However, might be a good idea to curbside heme/onc in AM, just to see if there is anything we might be missing / any studies theyd recommend for observation.  DVT prophylaxis: Lovenox Code Status: Full Family Communication: No family in room Disposition Plan: Home after HHS and AKI resolved Consults called: None Admission status: Admit to inpatient  Severity of Illness: The appropriate patient status for this patient is INPATIENT. Inpatient status is judged to be reasonable and necessary in order to provide the  required intensity of service to ensure the patient's safety. The patient's presenting symptoms, physical exam findings, and initial radiographic and laboratory data in the context of their chronic comorbidities is felt to place them at high risk for further clinical deterioration. Furthermore, it is not anticipated that the patient will be medically stable for discharge from the hospital within 2 midnights of admission. The following factors support the patient status of inpatient.   IP status for treatment of HHS with IV insulin, fluids, and frequent labs.   * I certify that at the point of admission it is my clinical judgment that the patient will require inpatient hospital care spanning beyond 2 midnights from the point of admission due to high intensity of service, high risk for further deterioration and high frequency of surveillance required.*    Halynn Reitano M. DO [facility]  How to contact the [facility] Attending or Consulting provider [place] or covering provider during after hours [place], for this patient?  1. Check the care team in [facility] and look for a) attending/consulting TR[facility] provider listed and b) the [facility] team listed 2. Log into [email or web address]  Amion Physician Scheduling and messaging for groups and whole hospitals  On call and physician scheduling software for group practices, residents, hospitalists and other medical providers for call, clinic, rotation and shift schedules. OnCall [employer] is a hospital-wide system for scheduling doctors and paging doctors on call. EasyPlot is for scientific plotting and data analysis.  [email or web address]  and use [place]'s universal password to access. If you do not have the password, please contact the hospital operator.  3. Locate the [facility] provider you are looking for under [facility] and page to a number that you can be directly reached. 4. If you still have difficulty reaching the provider, please page the [facility] (Director on Call) for  the Hospitalists listed on amion for assistance.  12/18/2020, 9:38 PM

## 2020-12-18 NOTE — ED Notes (Signed)
Dr Reather Converse aware of Glucose of 1070.Brendan Ellis

## 2020-12-18 NOTE — ED Provider Notes (Signed)
[facility] EMERGENCY DEPARTMENT Provider Note   CSN: [id number] Arrival date & time: 12/18/20  1304     History Chief Complaint  Patient presents with   Weight Loss    Brendan Ellis is a 65 y.o. male.  Patient with high blood pressure, leukemia treated 2019, atrial fibrillation paroxysmal, electrolyte abnormality, heart failure history presents with general weakness, feeling fuzzy headed and weight loss.  Patient has been trying to lose weight the past month and is lost 30 pounds.  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  He has been exercising and decreasing intake potentially to get to a goal of 205 pounds.  No unilateral weakness or concerning headaches.  Daughter noticed that he was a little confused and more forgetful recently.  Increased urination, no history of diabetes.        Past Medical History:  Diagnosis Date   Hypertension    Leukemia ([place])    PAF (paroxysmal atrial fibrillation) ([place]) 04/27/2018    Patient Active Problem List   Diagnosis Date Noted   Hypokalemia 04/28/2018   Hypomagnesemia 04/28/2018   PAC (premature atrial contraction) 04/28/2018   Acute exacerbation of CHF (congestive heart failure) ([place]) 04/27/2018   Acute on chronic systolic congestive heart failure ([place]) 04/27/2018   Community acquired pneumonia of right lower lobe of lung 04/27/2018   AML (acute myeloblastic leukemia) ([place]) [id number]   Acute systolic CHF (congestive heart failure) ([place]) 04/27/2018    History reviewed. No pertinent surgical history.     Family History  Problem Relation Age of Onset   Hypertension Mother    Diabetes Mother     Social History   Tobacco Use   Smoking status: Never Smoker   Smokeless tobacco: Never Used  Substance Use Topics   Alcohol use: Never   Drug use: Never    Home Medications Prior to Admission medications   Medication Sig Start Date End Date Taking? Authorizing Provider  aspirin 81  MG chewable tablet Chew 1 tablet (81 mg total) by mouth daily. 04/30/18  Yes Eugenie Filler, MD  carvedilol (COREG) 3.125 MG tablet Take 3.125 mg by mouth 2 (two) times daily with a meal. (BP > 120 Take 2 tablets)   Yes [provider]  furosemide (LASIX) 40 MG tablet Take 40 mg by mouth daily.    Yes [provider]  spironolactone (ALDACTONE) 25 MG tablet Take 1 tablet by mouth daily. 04/08/20 04/08/21 Yes [provider]  acetaminophen (TYLENOL) 500 MG tablet Take 500-1,000 mg by mouth every 6 (six) hours as needed for moderate pain.    [provider]  dronabinol (MARINOL) 2.5 MG capsule Take 2.5 mg by mouth 3 (three) times daily.    [provider]  famotidine (PEPCID) 20 MG tablet Take 20 mg by mouth 2 (two) times daily.     [provider]  fluconazole (DIFLUCAN) 200 MG tablet Take 200 mg by mouth.  04/03/18   [provider]  lisinopril (PRINIVIL,ZESTRIL) 2.5 MG tablet Take 1 tablet (2.5 mg total) by mouth daily as needed (BP >130). 04/29/18   Eugenie Filler, MD  magnesium oxide (MAG-OX) 400 MG tablet Take 400 mg by mouth 3 (three) times daily.    [provider]  potassium chloride SA (KLOR-CON M15) 15 MEQ tablet Take 30 mEq by mouth 3 (three) times daily.    [provider]    Allergies    Patient has no known allergies.  Review of Systems  Review of Systems  Constitutional: Positive for fatigue and unexpected weight change. Negative for chills and fever.  HENT: Negative for congestion.   Eyes: Negative for visual disturbance.  Respiratory: Negative for shortness of breath.   Cardiovascular: Negative for chest pain.  Gastrointestinal: Negative for abdominal pain and vomiting.  Genitourinary: Negative for dysuria and flank pain.  Musculoskeletal: Negative for back pain, neck pain and neck stiffness.  Skin: Negative for rash.  Neurological: Negative for light-headedness and headaches.    Physical  Exam Updated Vital Signs BP 116/75    Pulse 78    Temp 97.6 F (36.4 C) (Oral)    Resp 20    Ht 6\' 2"  (1.88 m)    Wt 93.6 kg    SpO2 98%    BMI 26.50 kg/m   Physical Exam Vitals and nursing note reviewed.  Constitutional:      Appearance: He is well-developed and well-nourished.  HENT:     Head: Normocephalic and atraumatic.     Mouth/Throat:     Mouth: Mucous membranes are dry.  Eyes:     General:        Right eye: No discharge.        Left eye: No discharge.     Conjunctiva/sclera: Conjunctivae normal.  Neck:     Trachea: No tracheal deviation.  Cardiovascular:     Rate and Rhythm: Normal rate and regular rhythm.  Pulmonary:     Effort: Pulmonary effort is normal.     Breath sounds: Normal breath sounds.  Abdominal:     General: There is no distension.     Palpations: Abdomen is soft.     Tenderness: There is no abdominal tenderness. There is no guarding.  Musculoskeletal:        General: No edema.     Cervical back: Normal range of motion and neck supple.  Skin:    General: Skin is warm.     Findings: No rash.  Neurological:     Mental Status: He is alert and oriented to person, place, and time.  Psychiatric:        Mood and Affect: Mood and affect normal.     ED Results / Procedures / Treatments   Labs (all labs ordered are listed, but only abnormal results are displayed) Labs Reviewed  URINE CULTURE  CBC WITH DIFFERENTIAL/PLATELET  COMPREHENSIVE METABOLIC PANEL  URINALYSIS, ROUTINE W REFLEX MICROSCOPIC  TSH  MAGNESIUM    EKG None  Radiology No results found.  Procedures .Critical Care Performed by: Elnora Morrison, MD Authorized by: Elnora Morrison, MD   Critical care provider statement:    Critical care time (minutes):  40   Critical care start time:  12/18/2020 2:20 PM   Critical care end time:  12/18/2020 3:00 PM   Critical care time was exclusive of:  Separately billable procedures and treating other patients and teaching time   Critical  care was necessary to treat or prevent imminent or life-threatening deterioration of the following conditions:  Endocrine crisis   Critical care was time spent personally by me on the following activities:  Discussions with consultants, evaluation of patient's response to treatment, examination of patient, ordering and performing treatments and interventions, ordering and review of laboratory studies, pulse oximetry, re-evaluation of patient's condition, obtaining history from patient or surrogate and review of old charts   (including critical care time)  Medications Ordered in ED Medications - No data to display  ED Course  I have reviewed the triage  vital signs and the nursing notes.  Pertinent labs & imaging results that were available during my care of the patient were reviewed by me and considered in my medical decision making (see chart for details).    MDM Rules/Calculators/A&P                          Patient presents with general weakness and intentional weight loss from dieting/exercise, juicing and trying mostly keto diet. Patient nontoxic on exam, generally weak, clinically dehydrated. Discussed broad differential diagnosis including metabolic, urine infection, malignancy, anemia, other.  Blood work ordered and reviewed normal white blood cell count, normal hemoglobin, sodium significantly low 120s, glucose greater than the thousand, acute renal failure creatinine 1.9, minimal ketones in the urine, small anion gap 17 likely from dehydration.  Small IV fluid bolus ordered due to heart failure history, 500 cc over 2 hours. Patient will need insulin, admission.   Insulin drip started.  Hospitalist paged.  COVID sent.   Final Clinical Impression(s) / ED Diagnoses Final diagnoses:  Weight loss  General weakness    Rx / DC Orders ED Discharge Orders    None       Elnora Morrison, MD 12/18/20 1556

## 2020-12-18 NOTE — ED Triage Notes (Signed)
He lost 30 pounds in a month with dieting for weight loss. He has been lethargic for the past 2 weeks. He wants to have his electrolytes checked. Increased urination. No hx of diabetes.

## 2020-12-18 NOTE — ED Notes (Signed)
Updated pt and family re: admission bed status and transfer estimated time.

## 2020-12-19 LAB — GLUCOSE, CAPILLARY
Glucose-Capillary: 176 mg/dL — ABNORMAL HIGH (ref 70–99)
Glucose-Capillary: 180 mg/dL — ABNORMAL HIGH (ref 70–99)
Glucose-Capillary: 181 mg/dL — ABNORMAL HIGH (ref 70–99)
Glucose-Capillary: 187 mg/dL — ABNORMAL HIGH (ref 70–99)
Glucose-Capillary: 193 mg/dL — ABNORMAL HIGH (ref 70–99)
Glucose-Capillary: 196 mg/dL — ABNORMAL HIGH (ref 70–99)
Glucose-Capillary: 198 mg/dL — ABNORMAL HIGH (ref 70–99)
Glucose-Capillary: 213 mg/dL — ABNORMAL HIGH (ref 70–99)
Glucose-Capillary: 313 mg/dL — ABNORMAL HIGH (ref 70–99)
Glucose-Capillary: 353 mg/dL — ABNORMAL HIGH (ref 70–99)
Glucose-Capillary: 384 mg/dL — ABNORMAL HIGH (ref 70–99)

## 2020-12-19 LAB — BASIC METABOLIC PANEL
Anion gap: 15 (ref 5–15)
Anion gap: 15 (ref 5–15)
Anion gap: 16 — ABNORMAL HIGH (ref 5–15)
BUN: 38 mg/dL — ABNORMAL HIGH (ref 8–23)
BUN: 34 mg/dL — ABNORMAL HIGH (ref 8–23)
BUN: 34 mg/dL — ABNORMAL HIGH (ref 8–23)
CO2: 28 mmol/L (ref 22–32)
CO2: 28 mmol/L (ref 22–32)
CO2: 27 mmol/L (ref 22–32)
Calcium: 9.4 mg/dL (ref 8.9–10.3)
Calcium: 9.1 mg/dL (ref 8.9–10.3)
Calcium: 9.1 mg/dL (ref 8.9–10.3)
Chloride: 91 mmol/L — ABNORMAL LOW (ref 98–111)
Chloride: 93 mmol/L — ABNORMAL LOW (ref 98–111)
Chloride: 90 mmol/L — ABNORMAL LOW (ref 98–111)
Creatinine, Ser: 1.43 mg/dL — ABNORMAL HIGH (ref 0.61–1.24)
Creatinine, Ser: 1.46 mg/dL — ABNORMAL HIGH (ref 0.61–1.24)
Creatinine, Ser: 1.63 mg/dL — ABNORMAL HIGH (ref 0.61–1.24)
GFR, Estimated: 55 mL/min — ABNORMAL LOW (ref >60)
GFR, Estimated: 54 mL/min — ABNORMAL LOW (ref >60)
GFR, Estimated: 47 mL/min — ABNORMAL LOW (ref >60)
Glucose, Bld: 196 mg/dL — ABNORMAL HIGH (ref 70–99)
Glucose, Bld: 193 mg/dL — ABNORMAL HIGH (ref 70–99)
Glucose, Bld: 325 mg/dL — ABNORMAL HIGH (ref 70–99)
Potassium: 2.8 mmol/L — ABNORMAL LOW (ref 3.5–5.1)
Potassium: 2.9 mmol/L — ABNORMAL LOW (ref 3.5–5.1)
Potassium: 3.5 mmol/L (ref 3.5–5.1)
Sodium: 134 mmol/L — ABNORMAL LOW (ref 135–145)
Sodium: 136 mmol/L (ref 135–145)
Sodium: 133 mmol/L — ABNORMAL LOW (ref 135–145)

## 2020-12-19 LAB — OSMOLALITY: Osmolality: 323 mOsm/kg (ref 275–295)

## 2020-12-19 LAB — HIV ANTIBODY (ROUTINE TESTING W REFLEX): HIV Screen 4th Generation wRfx: NONREACTIVE

## 2020-12-19 MED ORDER — LIVING WELL WITH DIABETES BOOK
Freq: Once | Status: AC
  Filled 2020-12-19: qty 1

## 2020-12-19 MED ORDER — POTASSIUM CHLORIDE 10 MEQ/100ML IV SOLN
10.0000 meq | INTRAVENOUS | Status: AC
  Administered 2020-12-19 (×4): 10 meq via INTRAVENOUS
  Filled 2020-12-19 (×4): qty 100

## 2020-12-19 MED ORDER — INSULIN GLARGINE 100 UNIT/ML ~~LOC~~ SOLN
110.0000 [IU] | Freq: Every day | SUBCUTANEOUS | Status: DC
  Filled 2020-12-19: qty 1.1

## 2020-12-19 MED ORDER — INSULIN GLARGINE 100 UNIT/ML ~~LOC~~ SOLN
10.0000 [IU] | Freq: Two times a day (BID) | SUBCUTANEOUS | Status: DC
  Administered 2020-12-19: 10 [IU] via SUBCUTANEOUS
  Filled 2020-12-19 (×2): qty 0.1

## 2020-12-19 MED ORDER — INSULIN STARTER KIT- PEN NEEDLES (ENGLISH)
1.0000 | Freq: Once | Status: AC
  Administered 2020-12-19: 1
  Filled 2020-12-19: qty 1

## 2020-12-19 MED ORDER — SODIUM CHLORIDE 0.9 % IV SOLN: INTRAVENOUS | Status: DC

## 2020-12-19 MED ORDER — POTASSIUM CHLORIDE CRYS ER 20 MEQ PO TBCR
40.0000 meq | EXTENDED_RELEASE_TABLET | Freq: Once | ORAL | Status: AC
  Administered 2020-12-19: 40 meq via ORAL
  Filled 2020-12-19: qty 2

## 2020-12-19 MED ORDER — INSULIN GLARGINE 100 UNIT/ML ~~LOC~~ SOLN
10.0000 [IU] | Freq: Every day | SUBCUTANEOUS | Status: DC
  Administered 2020-12-19: 10 [IU] via SUBCUTANEOUS
  Filled 2020-12-19: qty 0.1

## 2020-12-19 MED ORDER — INSULIN ASPART 100 UNIT/ML ~~LOC~~ SOLN
0.0000 [IU] | Freq: Three times a day (TID) | SUBCUTANEOUS | Status: DC
  Administered 2020-12-19: 15 [IU] via SUBCUTANEOUS
  Administered 2020-12-19: 3 [IU] via SUBCUTANEOUS
  Administered 2020-12-19 – 2020-12-20 (×2): 11 [IU] via SUBCUTANEOUS
  Administered 2020-12-20 – 2020-12-21 (×4): 5 [IU] via SUBCUTANEOUS

## 2020-12-19 NOTE — Progress Notes (Addendum)
Inpatient Diabetes Program Recommendations  AACE/ADA: New Consensus Statement on Inpatient Glycemic Control (2015)  Target Ranges:  Prepandial:   less than 140 mg/dL      Peak postprandial:   less than 180 mg/dL (1-2 hours)      Critically ill patients:  140 - 180 mg/dL   Lab Results  Component Value Date   GLUCAP 180 (H) 12/19/2020    Review of Glycemic Control Results for KHA, HARI (MRN [id number]) as of 12/19/2020 08:46  Ref. Range 12/19/2020 04:38 12/19/2020 06:29 12/19/2020 07:40  Glucose-Capillary Latest Ref Range: 70 - 99 mg/dL 176 (H) 198 (H) 180 (H)   Diabetes history: New onset DM Outpatient Diabetes medications: none Current orders for Inpatient glycemic control: Iv insulin to Lantus 10 units QD, Novolog 0-15 units TID  Inpatient Diabetes Program Recommendations:    Noted transition. Anticipate glucose trends to be increased as drip rates were > 3.0 units/hr. Consider further increasing Lantus to BID.  Ordered LWWDM booklet, dietitian consult and insulin starter kit. Will plan to reach out to patient.   Addendum: Spoke with patient regarding new onset diabetes. Patient has experienced fatigue, weight loss and polyuria.  Explained what a A1c is and what it measures and that we are waiting for his result. Also reviewed goal A1c with patient, importance of good glucose control @ home, and blood sugar goals. Reviewed patho of DM, need for insulin, HHS vs DKA, impact of keto diet/juicing on diabetes, impact of exercise, role of pancreas, current trends, hypo vs hyper glycemia, interventions, vascular changes and commorbidities. Patient will need a meter at discharge. Blood glucose meter (includes strips and lancets) (#[id number]). Reviewed frequency of blood sugar checks and when to call MD.  Patient to make follow up with PCP in next two weeks.  Has recently been dieting, "juicing" and on keto diet. Reviewed impact of carbohydrate intake, need for carbohydrates in diet, daily  alottment, importance of adding protein, reading nutritional labels, plate method, and importance of being mindful. Dietitian consult placed. Patient very motivated to make changes.  Educated patient and spouse on insulin pen use at home. Reviewed contents of insulin flexpen starter kit. Reviewed all steps if insulin pen including attachment of needle, 2-unit air shot, dialing up dose, giving injection, removing needle, disposal of sharps, storage of unused insulin, disposal of insulin etc. Patient able to provide successful return demonstration. Also reviewed troubleshooting with insulin pen. MD to give patient Rxs for insulin pens and insulin pen needles. Videos sent to patient's cell phone, information added to discharge summary and reached out to RN to help assist with injections. Will also add endocrinology list. Patient has no further quesitons at this time.    Thanks, Bronson Curb, MSN, RNC-OB Diabetes Coordinator [phone] (8a-5p)

## 2020-12-19 NOTE — Progress Notes (Signed)
Lantus given at 0630, CBG checked at 0740 with result of 180. IV insulin turned off and sliding scale to begin at 0800 per MD order.

## 2020-12-19 NOTE — Progress Notes (Signed)
Pt with 29 bts of Vtach on the monitor. Pt denies SOB, CP or palpitations. Vitals obtained (see flowsheet). MD on cal paged and awaiting return page. Will continue to monitor.

## 2020-12-19 NOTE — Progress Notes (Addendum)
[facility]  PROGRESS NOTE  Brendan Ellis WIO:[id number] DOB: 1956-08-03 DOA: 12/18/2020 PCP: Robert Bellow, PA-C   Brief HPI:   65 year old male with medical history of AML in remission, HFrEF with EF 35% in 2020, paroxysmal atrial fibrillation presented to ED with generalized weakness, fuzzy headed and weight loss.  Patient has been trying to lose weight over past month.  He lost 30 pounds by trying keto diet and drinking juices.  Patient's daughter noticed that he was little confused and more forgetful recently.  Also had increased urination.  No previous history of diabetes mellitus.  In the ED he was found to have blood glucose of 1000, mild anion gap of 17. He was started on IV insulin.   Subjective   Patient seen and examined, blood glucose has improved.  Denies chest pain or shortness of breath.   Assessment/Plan:     1. Hyper osmolar hyperglycemic state-resolved, patient started on IV insulin and IV fluids.  Currently he is off IV insulin started on Lantus 10 units subcu daily.  Sliding scale insulin with NovoLog. 2. Hypokalemia-potassium is 2.9, will replace potassium with IV KCl 10 mEq x 3 and check BMP in am. 3. New onset diabetes mellitus type 2-patient started on carb modified diet, CBG elevated blood glucose 180s to 190s.  Will change Lantus to 10 units subcu twice daily.  Continue moderate sliding scale insulin with NovoLog. 4. Acute kidney injury-likely prerenal from dehydration, presented with creatinine 1.90 which has improved to 1.46 with IV fluids.  Continue IV normal saline.  Follow BMP in am. 5. History of HFrEF-continue aspirin, Coreg.  Diuretics on hold. 6. History of AML-patient is in remission, he was followed by oncologist at [facility].  He received chemotherapy for 1 year.  At this time he is in remission.  Patient has normal WBC count with normal differential.  Called and discussed with oncologist Dr. Lindi Adie, he does not feel that patient needs any further  work-up.     COVID-19 Labs  No results for input(s): DDIMER, FERRITIN, LDH, CRP in the last 72 hours.  Lab Results  Component Value Date   [place] NEGATIVE 12/18/2020     Scheduled medications:   . aspirin  81 mg Oral Daily  . carvedilol  3.125 mg Oral BID WC  . Chlorhexidine Gluconate Cloth  6 each Topical Q0600  . enoxaparin (LOVENOX) injection  40 mg Subcutaneous Q24H  . famotidine  20 mg Oral BID  . insulin aspart  0-15 Units Subcutaneous TID WC  . insulin glargine  10 Units Subcutaneous Daily  . insulin starter kit- pen needles  1 kit Other Once         CBG: Recent Labs  Lab 12/19/20 0324 12/19/20 0438 12/19/20 0629 12/19/20 0740 12/19/20 0916  GLUCAP 181* 176* 198* 180* 196*    SpO2: 100 %    CBC: Recent Labs  Lab 12/18/20 1404 12/18/20 1526  WBC 6.4  --   NEUTROABS 4.7  --   HGB 13.7 13.9  HCT 41.2 41.0  MCV 88.6  --   PLT 187  --     Basic Metabolic Panel: Recent Labs  Lab 12/18/20 1404 12/18/20 1423 12/18/20 1526 12/18/20 2203 12/19/20 0109 12/19/20 0653  NA 123*  --  126* 135 134* 136  K 4.6  --  4.2 3.7 2.8* 2.9*  CL 78*  --   --  89* 91* 93*  CO2 28  --   --  33* 28 28  GLUCOSE 1,070*  --   --  389* 196* 193*  BUN 39*  --   --  37* 38* 34*  CREATININE 1.90*  --   --  1.68* 1.43* 1.46*  CALCIUM 9.3  --   --  10.1 9.4 9.1  MG  --  2.2  --   --   --   --      Liver Function Tests: Recent Labs  Lab 12/18/20 1404  AST 17  ALT 18  ALKPHOS 96  BILITOT 1.4*  PROT 7.8  ALBUMIN 3.9     Antibiotics: Anti-infectives (From admission, onward)   None       DVT prophylaxis: Lovenox  Code Status: Full code  Family Communication: No family at bedside   Consultants:    Procedures:      Objective   Vitals:   12/19/20 0700 12/19/20 0800 12/19/20 0900 12/19/20 1000  BP: 136/77 125/79  135/69  Pulse: 69 72 86 73  Resp: _0 Temp:      TempSrc:      SpO2: 98% 99% 98% 100%  Weight:       Height:        Intake/Output Summary (Last 24 hours) at 12/19/2020 1135 Last data filed at 12/19/2020 0754 Gross per 24 hour  Intake 1504.35 ml  Output 1370 ml  Net 134.35 ml    01/20 1901 - 01/22 0700 In: 1365.4 [P.O.:296; I.V.:1069.4] Out: 1370 [FHQRF:7588]  Filed Weights   12/18/20 1312 12/18/20 2113  Weight: 93.6 kg 95.9 kg    Physical Examination:        General-appears in no acute distress  Heart-S1-S2, regular, no murmur auscultated  Lungs-clear to auscultation bilaterally, no wheezing or crackles auscultated  Abdomen-soft, nontender, no organomegaly  Extremities-no edema in the lower extremities  Neuro-alert, oriented x3, no focal deficit noted   Status is: Inpatient  Dispo: The patient is from: Home              Anticipated d/c is to: Home              Anticipated d/c date is: 12/20/2020              Patient currently not stable for discharge  Barrier to discharge-continuing replenishment of electrolytes, monitoring of blood glucose       Data Reviewed:   Recent Results (from the past 240 hour(s))  SARS Coronavirus 2 by RT PCR (hospital order, performed in [facility] hospital lab) Nasopharyngeal Nasopharyngeal Swab     Status: None   Collection Time: 12/18/20  3:25 PM   Specimen: Nasopharyngeal Swab  Result Value Ref Range Status   SARS Coronavirus 2 NEGATIVE NEGATIVE Final    Comment: (NOTE) SARS-CoV-2 target nucleic acids are NOT DETECTED.  The SARS-CoV-2 RNA is generally detectable in upper and lower respiratory specimens during the acute phase of infection. The lowest concentration of SARS-CoV-2 viral copies this assay can detect is 250 copies / mL. A negative result does not preclude SARS-CoV-2 infection and should not be used as the sole basis for treatment or other patient management decisions.  A negative result may occur with improper specimen collection / handling, submission of specimen other than nasopharyngeal swab, presence of  viral mutation(s) within the areas targeted by this assay, and inadequate number of viral copies (<250 copies / mL). A negative result must be combined with clinical observations, patient history, and epidemiological information.  Fact Sheet for Patients:   StrictlyIdeas.no  Fact Sheet for Healthcare Providers: BankingDealers.co.za  This test is not yet approved or  cleared by the [place] FDA and has been authorized for detection and/or diagnosis of SARS-CoV-2 by FDA under an Emergency Use Authorization (EUA).  This EUA will remain in effect (meaning this test can be used) for the duration of the COVID-19 declaration under Section 564(b)(1) of the Act, 21 U.S.C. section 360bbb-3(b)(1), unless the authorization is terminated or revoked sooner.  Performed at [facility], [address] [place], [place] [address]   MRSA PCR Screening     Status: None   Collection Time: 12/18/20  9:12 PM   Specimen: Nasal Mucosa; Nasopharyngeal  Result Value Ref Range Status   MRSA by PCR NEGATIVE NEGATIVE Final    Comment:        The GeneXpert MRSA Assay (FDA approved for NASAL specimens only), is one component of a comprehensive MRSA colonization surveillance program. It is not intended to diagnose MRSA infection nor to guide or monitor treatment for MRSA infections. Performed at [facility], [place] [address], [place], [place] [address]          Oswald Hillock   [facility] If 7PM-7AM, please contact night-coverage at [email or web address], Office  [phone]   12/19/2020, 11:35 AM  LOS: 1 day

## 2020-12-20 LAB — BASIC METABOLIC PANEL
Anion gap: 13 (ref 5–15)
BUN: 29 mg/dL — ABNORMAL HIGH (ref 8–23)
CO2: 22 mmol/L (ref 22–32)
Calcium: 8.2 mg/dL — ABNORMAL LOW (ref 8.9–10.3)
Chloride: 94 mmol/L — ABNORMAL LOW (ref 98–111)
Creatinine, Ser: 1.4 mg/dL — ABNORMAL HIGH (ref 0.61–1.24)
GFR, Estimated: 57 mL/min — ABNORMAL LOW (ref >60)
Glucose, Bld: 373 mg/dL — ABNORMAL HIGH (ref 70–99)
Potassium: 3 mmol/L — ABNORMAL LOW (ref 3.5–5.1)
Sodium: 129 mmol/L — ABNORMAL LOW (ref 135–145)

## 2020-12-20 LAB — GLUCOSE, CAPILLARY
Glucose-Capillary: 333 mg/dL — ABNORMAL HIGH (ref 70–99)
Glucose-Capillary: 245 mg/dL — ABNORMAL HIGH (ref 70–99)
Glucose-Capillary: 243 mg/dL — ABNORMAL HIGH (ref 70–99)
Glucose-Capillary: 278 mg/dL — ABNORMAL HIGH (ref 70–99)

## 2020-12-20 MED ORDER — INSULIN GLARGINE 100 UNIT/ML ~~LOC~~ SOLN
15.0000 [IU] | Freq: Two times a day (BID) | SUBCUTANEOUS | Status: DC
  Administered 2020-12-20 – 2020-12-21 (×3): 15 [IU] via SUBCUTANEOUS
  Filled 2020-12-20 (×4): qty 0.15

## 2020-12-20 MED ORDER — INSULIN ASPART 100 UNIT/ML ~~LOC~~ SOLN
3.0000 [IU] | Freq: Three times a day (TID) | SUBCUTANEOUS | Status: DC
  Administered 2020-12-20 – 2020-12-21 (×4): 3 [IU] via SUBCUTANEOUS

## 2020-12-20 MED ORDER — POTASSIUM CHLORIDE 10 MEQ/100ML IV SOLN
10.0000 meq | INTRAVENOUS | Status: AC
  Administered 2020-12-20 (×4): 10 meq via INTRAVENOUS
  Filled 2020-12-20 (×4): qty 100

## 2020-12-20 NOTE — Plan of Care (Signed)
Nutrition Education Note   RD consulted for nutrition education regarding diabetes.   65 year old male with medical history of AML in remission, HFrEF with EF 35% in 2020 and paroxysmal atrial fibrillation who is admitted with new DM  Spoke with pt via phone. Pt reports good appetite and oral intake pta and in hospital; pt is eating 100% of meals. Pt is very motivated to learn and make changes regarding his diabetes. Pt reports that he eats all organic foods at home. Pt admits to drinking a lot of organic juices that are high in sugar; he did not realize that these were unhealthy. Pt reports an intentional 30lb weight loss. Pt reports his UBW is ~237lbs and that he has lost down to 205lbs with diet changes and exercise. Pt has eliminated all "junk foods" from his diet and has also been on the keto diet at times. 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   RD provided "Nutrition and Type II Diabetes" handout from the Academy of Nutrition and Dietetics. Discussed different food groups and their effects on blood sugar, emphasizing carbohydrate-containing foods. Provided list of carbohydrates and recommended serving sizes of common foods.  Discussed importance of controlled and consistent carbohydrate intake throughout the day. Provided examples of ways to balance meals/snacks and encouraged intake of high-fiber, whole grain complex carbohydrates. Teach back method used.  Expect good compliance.  Body mass index is 27.86 kg/m. Pt meets criteria for overweight based on current BMI.  Labs and medications reviewed.   No further nutrition interventions warranted at this time. RD contact information provided. If additional nutrition issues arise, please re-consult RD.  Koleen Distance MS, RD, LDN Please refer to [facility] for RD and/or RD on-call/weekend/after hours pager

## 2020-12-20 NOTE — Progress Notes (Signed)
[facility]  PROGRESS NOTE  Brendan Ellis [id number] DOB: 01-Sep-1956 DOA: 12/18/2020 PCP: Robert Bellow, PA-C   Brief HPI:   65 year old male with medical history of AML in remission, HFrEF with EF 35% in 2020, paroxysmal atrial fibrillation presented to ED with generalized weakness, fuzzy headed and weight loss.  Patient has been trying to lose weight over past month.  He lost 30 pounds by trying keto diet and drinking juices.  Patient's daughter noticed that he was little confused and more forgetful recently.  Also had increased urination.  No previous history of diabetes mellitus.  In the ED he was found to have blood glucose of 1000, mild anion gap of 17. He was started on IV insulin.   Subjective   Patient seen and examined, blood glucose has improved.  Denies chest pain or shortness of breath.   Assessment/Plan:     1. Hyper osmolar hyperglycemic state-resolved, patient started on IV insulin and IV fluids.  Currently he is off IV insulin started on Lantus.  Sliding scale insulin with NovoLog. 2. Hypokalemia-potassium is 3.0, will replace potassium with IV KCl 10 mg x 4, follow BMP in am.  3. New onset diabetes mellitus type 2-patient started on carb modified diet, CBG elevated blood glucose in 300s.  Will increase Lantus to 15 units subcu twice daily.  Also start NovoLog 3 units 3 times daily meal coverage.  Continue moderate sliding scale insulin with NovoLog. 4. Acute kidney injury-likely prerenal from dehydration, presented with creatinine 1.90 which has improved to 1.40 with IV fluids.  Continue IV normal saline.  Follow BMP in am. 5. History of HFrEF-continue aspirin, Coreg.  Diuretics on hold. 6. History of AML-patient is in remission, he was followed by oncologist at [facility].  He received chemotherapy for 1 year.  At this time he is in remission.  Patient has normal WBC count with normal differential.  Called and discussed with oncologist Dr. Lindi Adie, he does not feel  that patient needs any further work-up. 7. ?  Underlying psychiatric illness-patient's son called and said that he was concerned that patient may have underlying psychiatric illness as he has been having delusions of grandiosity.  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  He does not have possession of any physical building, though he says that he is running businesses.  Discussed with patient he denies any depression, he says that he does not have physical posession of the building yet but he will be getting it soon.  He seems to have intact insight and judgment, he does not want a psychiatric evaluation.     COVID-19 Labs  No results for input(s): DDIMER, FERRITIN, LDH, CRP in the last 72 hours.  Lab Results  Component Value Date   [place] NEGATIVE 12/18/2020     Scheduled medications:   . aspirin  81 mg Oral Daily  . carvedilol  3.125 mg Oral BID WC  . Chlorhexidine Gluconate Cloth  6 each Topical Q0600  . enoxaparin (LOVENOX) injection  40 mg Subcutaneous Q24H  . famotidine  20 mg Oral BID  . insulin aspart  0-15 Units Subcutaneous TID WC  . insulin aspart  3 Units Subcutaneous TID WC  . insulin glargine  15 Units Subcutaneous BID         CBG: Recent Labs  Lab 12/19/20 1204 12/19/20 1643 12/19/20 2112 12/20/20 0742 12/20/20 1153  GLUCAP 313* 353* 384* 333* 245*    SpO2: 99 %  CBC: Recent Labs  Lab 12/18/20 1404 12/18/20 1526  WBC 6.4  --   NEUTROABS 4.7  --   HGB 13.7 13.9  HCT 41.2 41.0  MCV 88.6  --   PLT 187  --     Basic Metabolic Panel: Recent Labs  Lab 12/18/20 1423 12/18/20 1526 12/18/20 2203 12/19/20 0109 12/19/20 0653 12/19/20 1112 12/20/20 0620  NA  --    < > 135 134* 136 133* 129*  K  --    < > 3.7 2.8* 2.9* 3.5 3.0*  CL  --   --  89* 91* 93* 90* 94*  CO2  --   --  33* 28 28 27 22   GLUCOSE  --   --  389* 196* 193* 325* 373*  BUN  --   --  37* 38* 34* 34* 29*  CREATININE  --   --  1.68*  1.43* 1.46* 1.63* 1.40*  CALCIUM  --   --  10.1 9.4 9.1 9.1 8.2*  MG 2.2  --   --   --   --   --   --    < > = values in this interval not displayed.     Liver Function Tests: Recent Labs  Lab 12/18/20 1404  AST 17  ALT 18  ALKPHOS 96  BILITOT 1.4*  PROT 7.8  ALBUMIN 3.9     Antibiotics: Anti-infectives (From admission, onward)   None       DVT prophylaxis: Lovenox  Code Status: Full code  Family Communication: No family at bedside   Consultants:    Procedures:      Objective   Vitals:   12/20/20 0454 12/20/20 0815 12/20/20 0858 12/20/20 1250  BP: 118/79 118/79 118/73 116/76  Pulse: 67 67 68 64  Resp: 16  18 17   Temp: 98 F (36.7 C)  98 F (36.7 C) 97.7 F (36.5 C)  TempSrc: Oral  Oral Oral  SpO2: 96%  98% 99%  Weight:      Height:        Intake/Output Summary (Last 24 hours) at 12/20/2020 1614 Last data filed at 12/20/2020 1426 Gross per 24 hour  Intake 1821.15 ml  Output 900 ml  Net 921.15 ml    01/21 1901 - 01/23 0700 In: 2845.5 [P.O.:536; I.V.:1940] Out: 950 [Urine:950]  Filed Weights   12/18/20 1312 12/18/20 2113 12/20/20 0100  Weight: 93.6 kg 95.9 kg 95.8 kg    Physical Examination:    General-appears in no acute distress  Heart-S1-S2, regular, no murmur auscultated  Lungs-clear to auscultation bilaterally, no wheezing or crackles auscultated  Abdomen-soft, nontender, no organomegaly  Extremities-no edema in the lower extremities  Neuro/psych-alert, oriented x3, no focal deficit noted, intact insight and judgment, mood and affect are normal    Status is: Inpatient  Dispo: The patient is from: Home              Anticipated d/c is to: Home              Anticipated d/c date is: 12/20/2020              Patient currently not stable for discharge  Barrier to discharge-continuing replenishment of electrolytes, monitoring of blood glucose       Data Reviewed:   Recent Results (from the past 240 hour(s))  Urine  culture     Status: Abnormal (Preliminary result)   Collection Time: 12/18/20  1:18 PM   Specimen: Urine, Random  Result Value  Ref Range Status   Specimen Description   Final    URINE, RANDOM Performed at [facility], [place], [address] [place], [place] [address]    Special Requests   Final    NONE Performed at [facility], [address] [place], [place] [address]    Culture (A)  Final    30,000 COLONIES/mL ESCHERICHIA COLI SUSCEPTIBILITIES TO FOLLOW Performed at [facility], [place] [address], [place], [place] [address]    Report Status PENDING  Incomplete  SARS Coronavirus 2 by RT PCR (hospital order, performed in [facility] hospital lab) Nasopharyngeal Nasopharyngeal Swab     Status: None   Collection Time: 12/18/20  3:25 PM   Specimen: Nasopharyngeal Swab  Result Value Ref Range Status   SARS Coronavirus 2 NEGATIVE NEGATIVE Final    Comment: (NOTE) SARS-CoV-2 target nucleic acids are NOT DETECTED.  The SARS-CoV-2 RNA is generally detectable in upper and lower respiratory specimens during the acute phase of infection. The lowest concentration of SARS-CoV-2 viral copies this assay can detect is 250 copies / mL. A negative result does not preclude SARS-CoV-2 infection and should not be used as the sole basis for treatment or other patient management decisions.  A negative result may occur with improper specimen collection / handling, submission of specimen other than nasopharyngeal swab, presence of viral mutation(s) within the areas targeted by this assay, and inadequate number of viral copies (<250 copies / mL). A negative result must be combined with clinical observations, patient history, and epidemiological information.  Fact Sheet for Patients:   StrictlyIdeas.no  Fact Sheet for Healthcare Providers: BankingDealers.co.za  This test is not yet approved or  cleared by the [place] FDA  and has been authorized for detection and/or diagnosis of SARS-CoV-2 by FDA under an Emergency Use Authorization (EUA).  This EUA will remain in effect (meaning this test can be used) for the duration of the COVID-19 declaration under Section 564(b)(1) of the Act, 21 U.S.C. section 360bbb-3(b)(1), unless the authorization is terminated or revoked sooner.  Performed at [facility], [address] [place], [place] [address]   MRSA PCR Screening     Status: None   Collection Time: 12/18/20  9:12 PM   Specimen: Nasal Mucosa; Nasopharyngeal  Result Value Ref Range Status   MRSA by PCR NEGATIVE NEGATIVE Final    Comment:        The GeneXpert MRSA Assay (FDA approved for NASAL specimens only), is one component of a comprehensive MRSA colonization surveillance program. It is not intended to diagnose MRSA infection nor to guide or monitor treatment for MRSA infections. Performed at [facility], [place] [address] [place], [place] [address]          Oswald Hillock   [facility] If 7PM-7AM, please contact night-coverage at [email or web address], Office  [phone]   12/20/2020, 4:14 PM  LOS: 2 days

## 2020-12-20 NOTE — Progress Notes (Signed)
Brendan Ellis was given written instructions on how to give himself insulin. He gave himself his evening insulin injection and did a very good job. Continue to let the patient draw UP HIS INSULIN AND GIVE HIMSELF his injection.

## 2020-12-21 LAB — BASIC METABOLIC PANEL
Anion gap: 10 (ref 5–15)
BUN: 19 mg/dL (ref 8–23)
CO2: 25 mmol/L (ref 22–32)
Calcium: 8.2 mg/dL — ABNORMAL LOW (ref 8.9–10.3)
Chloride: 101 mmol/L (ref 98–111)
Creatinine, Ser: 1.2 mg/dL (ref 0.61–1.24)
GFR, Estimated: >60 mL/min (ref >60)
Glucose, Bld: 267 mg/dL — ABNORMAL HIGH (ref 70–99)
Potassium: 3.2 mmol/L — ABNORMAL LOW (ref 3.5–5.1)
Sodium: 136 mmol/L (ref 135–145)

## 2020-12-21 LAB — CBC
HCT: 37.3 % — ABNORMAL LOW (ref 39.0–52.0)
Hemoglobin: 12.4 g/dL — ABNORMAL LOW (ref 13.0–17.0)
MCH: 29.7 pg (ref 26.0–34.0)
MCHC: 33.2 g/dL (ref 30.0–36.0)
MCV: 89.4 fL (ref 80.0–100.0)
Platelets: 166 10*3/uL (ref 150–400)
RBC: 4.17 MIL/uL — ABNORMAL LOW (ref 4.22–5.81)
RDW: 12.3 % (ref 11.5–15.5)
WBC: 4.7 10*3/uL (ref 4.0–10.5)
nRBC: 0 % (ref 0.0–0.2)

## 2020-12-21 LAB — MAGNESIUM: Magnesium: 1.7 mg/dL (ref 1.7–2.4)

## 2020-12-21 LAB — POTASSIUM: Potassium: 3.8 mmol/L (ref 3.5–5.1)

## 2020-12-21 LAB — URINE CULTURE: Culture: 30000 — AB

## 2020-12-21 LAB — GLUCOSE, CAPILLARY
Glucose-Capillary: 221 mg/dL — ABNORMAL HIGH (ref 70–99)
Glucose-Capillary: 207 mg/dL — ABNORMAL HIGH (ref 70–99)
Glucose-Capillary: 191 mg/dL — ABNORMAL HIGH (ref 70–99)

## 2020-12-21 LAB — HEMOGLOBIN A1C
Hgb A1c MFr Bld: >15.5 % — ABNORMAL HIGH (ref 4.8–5.6)
Mean Plasma Glucose: >398 mg/dL

## 2020-12-21 MED ORDER — POTASSIUM CHLORIDE 10 MEQ/100ML IV SOLN
10.0000 meq | Freq: Once | INTRAVENOUS | Status: AC
  Administered 2020-12-21: 10 meq via INTRAVENOUS
  Filled 2020-12-21: qty 100

## 2020-12-21 MED ORDER — LANTUS SOLOSTAR 100 UNIT/ML ~~LOC~~ SOPN
15.0000 [IU] | PEN_INJECTOR | Freq: Two times a day (BID) | SUBCUTANEOUS | 2 refills | Status: DC
Start: 2020-12-21 — End: 2022-10-21

## 2020-12-21 MED ORDER — MAGNESIUM SULFATE 2 GM/50ML IV SOLN
2.0000 g | Freq: Once | INTRAVENOUS | Status: AC
  Administered 2020-12-21: 2 g via INTRAVENOUS
  Filled 2020-12-21: qty 50

## 2020-12-21 MED ORDER — BLOOD GLUCOSE MONITOR KIT: PACK | 0 refills | Status: DC

## 2020-12-21 MED ORDER — NOVOLOG FLEXPEN 100 UNIT/ML ~~LOC~~ SOPN
PEN_INJECTOR | SUBCUTANEOUS | 11 refills | Status: DC
Start: 2020-12-21 — End: 2022-10-21

## 2020-12-21 MED ORDER — INSULIN PEN NEEDLE 32G X 4 MM MISC
3 refills | Status: DC
Start: 2020-12-21 — End: 2022-10-21

## 2020-12-21 MED ORDER — POTASSIUM CHLORIDE CRYS ER 20 MEQ PO TBCR
40.0000 meq | EXTENDED_RELEASE_TABLET | ORAL | Status: DC
  Administered 2020-12-21: 40 meq via ORAL
  Filled 2020-12-21: qty 2

## 2020-12-21 NOTE — TOC Transition Note (Signed)
Transition of Care [facility]) - CM/SW Discharge Note   Patient Details  Name: Brendan Ellis MRN: [id number] Date of Birth: [birth year]  Transition [facility] Care [facility]) CM/SW Contact:  Lynnell Catalan, RN Phone Number: 12/21/2020, 1:52 PM   Clinical Narrative:    MD asked this CM to speak with pt about home living situation. Spoke at length with pt and his daughter Brendan Ellis at bedside. 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 She states that pt has "grandiose" thoughts at baseline and has his whole life. She states that he is like a "high functioning autistic" and that none of his behaviors displayed in the hospital are new. Pt states that he lives at the address [address] [place] [address]. Daughter confirms this address and states that it is an apartment where he has to use his ID to get into and out of the building. Daughter states that pt is safe where he lives and she feels fine about him going back to his apartment. MD informed of above information.    Readmission Risk Interventions No flowsheet data found.

## 2020-12-21 NOTE — Progress Notes (Addendum)
Inpatient Diabetes Program Recommendations  AACE/ADA: New Consensus Statement on Inpatient Glycemic Control (2015)  Target Ranges:  Prepandial:   less than 140 mg/dL      Peak postprandial:   less than 180 mg/dL (1-2 hours)      Critically ill patients:  140 - 180 mg/dL   Results for ELIHUE, EBERT (MRN [id number]) as of 12/21/2020 07:00  Ref. Range 12/18/2020 14:04  Glucose Latest Ref Range: 70 - 99 mg/dL 1,070 [facility])   Results for KOLTAN, PORTOCARRERO (MRN [id number]) as of 12/21/2020 07:00  Ref. Range 12/20/2020 07:42 12/20/2020 11:53 12/20/2020 16:10 12/20/2020 21:27  Glucose-Capillary Latest Ref Range: 70 - 99 mg/dL 333 (H)  11 units NOVOLOG  15 units LANTUS @9 :32am  245 (H)  8 units NOVOLOG@1 :35pm 243 (H)  8 units NOVOLOG @6 :25pm 278 (H)     15 units LANTUS @10 :02pm   Results for JORDANI, NUNN (MRN [id number]) as of 12/21/2020 07:52  Ref. Range 12/21/2020 07:27  Glucose-Capillary Latest Ref Range: 70 - 99 mg/dL 221 (H)   Admit with: Hyperosmolar hyperglycemic state/ New Onset Diabetes   Current Orders: Lantus 15 units BID      Novolog Moderate Correction Scale/ SSI (0-15 units) TID AC      Novolog 3 units TID with meals    MD- Please consider the following in-hospital insulin adjustments:  1. Increase Lantus to 18 units BID (20% overall increase)  2. Increase Novolog Meal Coverage to 6 units TID with meals   Pt requests Insulin Pens for discharge: Lantus Insulin Pen- Order # [id number] Novolog Insulin Pen- Order # [id number] Insulin Pen Needles- Order # [id number]    Addendum 11:20am--Met with pt and daughter at bedside this AM.  Pt seemed slightly distracted and focused quite a bit on his menu at bedside but was able to repeat all correct info back to me when I asked him questions.  Spoke with pt about new diagnosis.  Explained what an A1C is (current A1c pending), basic pathophysiology of DM Type 2, basic home care, basic diabetes diet nutrition principles, importance of checking  CBGs and maintaining good CBG control to prevent long-term and short-term complications.  Reviewed signs and symptoms of hyperglycemia and hypoglycemia and how to treat hypoglycemia at home.  Also reviewed blood sugar goals and A1c goals for home.    Also reviewed Lantus and Novolog insulins with pt and daughter.  Discussed what they are, how they work, when to take, how to take, etc.  Discharging RN to review exact doses with pt prior to discharge.  Educated patient and daughter on insulin pen use at home.  (daughter familiar with injections as she uses Ozempic weekly at home for weight loss).  Reviewed all steps of insulin pen including attachment of needle, 2-unit air shot, dialing up dose, giving injection, rotation of injection sites, removing needle, disposal of sharps, storage of unused insulin, disposal of insulin etc.  Patient able to provide successful return demonstration.  Reviewed troubleshooting with insulin pen.  Have asked RNs caring for patient to please allow patient to give all injections here in hospital as much as possible for practice.  MD to give patient Rxs for insulin pens and insulin pen needles.      --Will follow patient during hospitalization--  Wyn Quaker RN, MSN, CDE Diabetes Coordinator Inpatient Glycemic Control Team Team Pager: [phone] (8a-5p)

## 2020-12-21 NOTE — Care Management Important Message (Signed)
Medicare important message printed for Nora Clements, NCM to give to the patient. 

## 2020-12-21 NOTE — Discharge Summary (Signed)
Physician Discharge Summary  Brendan Ellis OIZ:[id number] DOB: February 16, 1956 DOA: 12/18/2020  PCP: Robert Bellow, PA-C  Admit date: 12/18/2020 Discharge date: 12/21/2020  Time spent: 50 minutes  Recommendations for Outpatient Follow-up:  1. Follow-up PCP in 1 week  Discharge Diagnoses:  Principal Problem:   Hyperosmolar hyperglycemic state (HHS) ([place]) Active Problems:   Chronic systolic CHF (congestive heart failure) ([facility])   AML (acute myeloid leukemia) in remission ([place])   New onset type 2 diabetes mellitus ([place])   AKI (acute kidney injury) ([place])   Discharge Condition: Stable  Diet recommendation: Carb modified diet  Filed Weights   12/18/20 1312 12/18/20 2113 12/20/20 0100  Weight: 93.6 kg 95.9 kg 95.8 kg    History of present illness:  65 year old male with medical history of AML in remission, HFrEF with EF 35% in 2020, paroxysmal atrial fibrillation presented to ED with generalized weakness, fuzzy headed and weight loss.  Patient has been trying to lose weight over past month.  He lost 30 pounds by trying keto diet and drinking juices.  Patient's daughter noticed that he was little confused and more forgetful recently.  Also had increased urination.  No previous history of diabetes mellitus.  In the ED he was found to have blood glucose of 1000, mild anion gap of 17.  He was started on IV insulin.    Hospital Course:   1. Hyper osmolar hyperglycemic state-resolved, patient was started on started on IV insulin and IV fluids.  Currently he is off IV insulin started on Lantus.   2. Hypokalemia-potassium was 3.2 this morning, replete.  Repeat potassium is 3.8. 3. New onset diabetes mellitus type 2-hemoglobin A1c greater than 15.5.  Patient started on carb modified diet, CBG well controlled on Lantus 15 units subcu twice daily.  He was started on NovoLog 3 units 3 times daily meal coverage.  Will discharge patient home on Lantus 15 units subcu twice daily and moderate sliding scale  insulin with NovoLog.  He can follow-up with PCP in 1 week to adjust insulin based on blood glucose at home.  He will be given a glucometer and testing supplies along with insulin pen needles.  4. Acute kidney injury-likely prerenal from dehydration, presented with creatinine 1.90 which has improved to  1.20  with IV fluids.  5. History of HFrEF-continue aspirin, Coreg.    Continue diuretics, Lasix and spironolactone. 6. History of AML-patient is in remission, he was followed by oncologist at [facility].  He received chemotherapy for 1 year.  At this time he is in remission.  Patient has normal WBC count with normal differential.  Called and discussed with oncologist Dr. Lindi Adie, he does not feel that patient needs any further work-up. 7. ?  Underlying psychiatric illness-patient's son called and said that he was concerned that patient may have underlying psychiatric illness as he has been having delusions of grandiosity.  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  He does not have possession of any physical building, though he says that he is running businesses.                              Social work was consulted to assess living situation.  And as per social worker note   "MD asked this CM to speak with pt about home living situation. Spoke at length with pt and his daughter Brendan Ellis at bedside. 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 She states that pt has "grandiose" thoughts at baseline and has his whole life. She states that he is like a "high functioning autistic" and that none of his behaviors displayed in the hospital are new. Pt states that he lives at the address [address]. [place] [address]. Daughter confirms this address and states that it is an apartment where he has to use his ID to get into and  out of the building. Daughter states that pt is safe where he lives and she feels fine about him going back to his apartment. MD informed of above information".  Procedures:  None  Consultations:    Discharge Exam: Vitals:   12/21/20 0644 12/21/20 1336  BP: 119/75 114/74  Pulse: 61 60  Resp: 16 16  Temp: 98.1 F (36.7 C) 97.9 F (36.6 C)  SpO2: 98% 100%    General: Appears in no acute distress Cardiovascular: S1-S2, regular Respiratory: Clear to auscultation bilaterally  Discharge Instructions   Discharge Instructions    Ambulatory referral to Nutrition and Diabetic Education   Complete by: As directed    New diagnosis of Diabetes.  Likely to d/c home on insulin.  Pt requests Outpatient Diabetes Education Services after discharge.  Thanks!   Diet - low sodium heart healthy   Complete by: As directed    Increase activity slowly   Complete by: As directed      Allergies as of 12/21/2020   No Known Allergies     Medication List    TAKE these medications   aspirin 81 MG chewable tablet Chew 1 tablet (81 mg total) by mouth daily.   blood glucose meter kit and supplies Kit Dispense based on patient and insurance preference. Use up to four times daily as directed. (FOR ICD-9 250.00, 250.01).   carvedilol 25 MG tablet Commonly known as: COREG Take 25 mg by mouth 2 (two) times daily with a meal.   cholecalciferol 25 MCG (1000 UNIT) tablet Commonly known as: VITAMIN D3 Take 1,000 Units by mouth daily.   furosemide 20 MG tablet Commonly known as: LASIX Take 20 mg by mouth daily.   Insulin Pen Needle 32G X 4 MM Misc Use as directed   Lantus SoloStar 100 UNIT/ML Solostar Pen Generic drug: insulin glargine Inject 15 Units into the skin 2 (two) times daily.   NovoLOG FlexPen 100 UNIT/ML FlexPen Generic drug: insulin aspart Sliding scale insulin Less than 70 initiate hypoglycemia protocol 70-120  0 units 120-150 2unit 151-200 3 units 201-250 5units 251-300 8  units 301-350 11 units 351-400 15 units Greater than 400 call MD   spironolactone 25 MG tablet Commonly known as: ALDACTONE Take 1 tablet by mouth daily.      No Known Allergies    The results of significant diagnostics from this hospitalization (including imaging, microbiology, ancillary and laboratory) are listed below for reference.    Significant Diagnostic Studies: No results found.  Microbiology: Recent Results (from the past 240 hour(s))  Urine culture     Status: Abnormal   Collection Time: 12/18/20  1:18 PM   Specimen: Urine, Random  Result Value Ref Range Status   Specimen Description   Final    URINE, RANDOM Performed at [facility], [address] [place], [place] [address]    Special Requests   Final    NONE Performed at [facility], [address] [place], [place] [address]    Culture (A)  Final    30,000 COLONIES/mL ESCHERICHIA COLI 20,000 COLONIES/mL VIRIDANS STREPTOCOCCUS    Report Status 12/21/2020 FINAL  Final   Organism ID, Bacteria ESCHERICHIA COLI (A)  Final      Susceptibility   Escherichia coli - MIC*    AMPICILLIN >=32 RESISTANT Resistant     CEFAZOLIN 16 SENSITIVE Sensitive     CEFEPIME <=0.12 SENSITIVE Sensitive     CEFTRIAXONE <=0.25 SENSITIVE Sensitive     CIPROFLOXACIN <=0.25 SENSITIVE Sensitive     GENTAMICIN <=1 SENSITIVE Sensitive     IMIPENEM <=0.25 SENSITIVE Sensitive     NITROFURANTOIN <=16 SENSITIVE Sensitive     TRIMETH/SULFA <=20 SENSITIVE Sensitive     AMPICILLIN/SULBACTAM 16 INTERMEDIATE Intermediate     PIP/TAZO <=4 SENSITIVE Sensitive     * 30,000 COLONIES/mL ESCHERICHIA COLI  SARS Coronavirus 2 by RT PCR (hospital order, performed in [place] hospital lab) Nasopharyngeal Nasopharyngeal Swab     Status: None   Collection Time: 12/18/20  3:25 PM   Specimen: Nasopharyngeal Swab  Result Value Ref Range Status   SARS Coronavirus 2 NEGATIVE NEGATIVE Final    Comment: (NOTE) SARS-CoV-2 target  nucleic acids are NOT DETECTED.  The SARS-CoV-2 RNA is generally detectable in upper and lower respiratory specimens during the acute phase of infection. The lowest concentration of SARS-CoV-2 viral copies this assay can detect is 250 copies / mL. A negative result does not preclude SARS-CoV-2 infection and should not be used as the sole basis for treatment or other patient management decisions.  A negative result may occur with improper specimen collection / handling, submission of specimen other than nasopharyngeal swab, presence of viral mutation(s) within the areas targeted by this assay, and inadequate number of viral copies (<250 copies / mL). A negative result must be combined with clinical observations, patient history, and epidemiological information.  Fact Sheet for Patients:   StrictlyIdeas.no  Fact Sheet for Healthcare Providers: BankingDealers.co.za  This test is not yet approved or  cleared by the [place] FDA and has been authorized for detection and/or diagnosis of SARS-CoV-2 by FDA under an Emergency Use Authorization (EUA).  This EUA will remain in effect (meaning this test can be used) for the duration of the COVID-19 declaration under Section 564(b)(1) of the Act, 21 U.S.C. section 360bbb-3(b)(1), unless the authorization is terminated or revoked sooner.  Performed at [facility], [address] [place], [place] [address]   MRSA PCR Screening     Status: None   Collection Time: 12/18/20  9:12 PM   Specimen: Nasal Mucosa; Nasopharyngeal  Result Value Ref Range Status   MRSA by PCR NEGATIVE NEGATIVE Final    Comment:        The GeneXpert MRSA Assay (FDA approved for NASAL specimens only), is one component of a comprehensive MRSA colonization surveillance program. It is not intended to diagnose MRSA infection nor to guide or monitor treatment for MRSA infections. Performed at [facility], [place] [address], [place], [place] [address]      Labs: Basic Metabolic Panel: Recent Labs  Lab 12/18/20 1423 12/18/20 1526 12/19/20 0109 12/19/20 0653 12/19/20 1112 12/20/20 0620 12/21/20 0610 12/21/20 1424  NA  --    < > 134* 136 133* 129* 136  --  K  --    < > 2.8* 2.9* 3.5 3.0* 3.2* 3.8  CL  --    < > 91* 93* 90* 94* 101  --   CO2  --    < > _0 --   GLUCOSE  --    < > 196* 193* 325* 373* 267*  --   BUN  --    < > 38* 34* 34* 29* 19  --   CREATININE  --    < > 1.43* 1.46* 1.63* 1.40* 1.20  --   CALCIUM  --    < > 9.4 9.1 9.1 8.2* 8.2*  --   MG 2.2  --   --   --   --   --  1.7  --    < > = values in this interval not displayed.   Liver Function Tests: Recent Labs  Lab 12/18/20 1404  AST 17  ALT 18  ALKPHOS 96  BILITOT 1.4*  PROT 7.8  ALBUMIN 3.9   No results for input(s): LIPASE, AMYLASE in the last 168 hours. No results for input(s): AMMONIA in the last 168 hours. CBC: Recent Labs  Lab 12/18/20 1404 12/18/20 1526 12/21/20 0610  WBC 6.4  --  4.7  NEUTROABS 4.7  --   --   HGB 13.7 13.9 12.4*  HCT 41.2 41.0 37.3*  MCV 88.6  --  89.4  PLT 187  --  166     CBG: Recent Labs  Lab 12/20/20 1153 12/20/20 1610 12/20/20 2127 12/21/20 0727 12/21/20 1209  GLUCAP 245* 243* 278* 221* 207*       Signed:  Oswald Hillock MD.  [facility] 12/21/2020, 4:18 PM

## 2020-12-21 NOTE — Discharge Instructions (Signed)
Correction Insulin Correction insulin, also called a supplemental dose, is a small amount of insulin that can be used to lower your blood sugar (glucose) if it is too high. This dose brings your glucose level back to the target range. You will be instructed to check your glucose at certain times of the day and to use correction insulin as needed to lower your blood glucose. Correction insulin is primarily used as part of diabetes management. It may also be prescribed for people who do not have diabetes. What is a correction scale? A correction scale, also called a sliding scale, is prescribed by your health care provider to help you determine when you need correction insulin. Your correction scale is based on your individual treatment goals, and it has two parts:  Ranges of blood glucose levels.  How much correction insulin to give yourself if your blood sugar falls within a certain range. If your blood glucose is in your desired range, you will not need correction insulin. What type of insulin do I need? You may be prescribed rapid-acting or short-acting insulin as correction insulin. Talk with your health care provider or pharmacist about which type of correction insulin to take and when to take it. Rapid-acting insulin This insulin:  Starts working in the body (onset) in as little as 15 minutes.  Is at its highest strength (peak) in 1.5-2 hours.  Lasts (duration) for 4-5 hours. Short-acting insulin This insulin:  Starts working in the body (onset) in about 30 minutes.  Is at its highest strength (peak) in 2-3 hours.  Lasts (duration) for 3-6 hours. How do I manage my blood glucose with correction insulin? Giving a correction dose  Check your blood glucose as directed by your health care provider.  Use your correction scale to find the range that your blood glucose is in.  Identify the units of insulin that match your blood glucose range.  Give yourself the dose of  correction insulin that your health care provider has prescribed in your correction scale. Always make sure you are using the right type of insulin.   Keeping a blood glucose log  Write down your blood glucose test results and the amount of insulin that you give yourself. Do this every time you check blood glucose or take insulin. Bring this log with you to your medical visits. This information will help your health care provider manage your medicines.  Note anything that may affect your blood glucose, such as: ? Changes in normal exercise or activity. ? Changes in your normal schedule, such as changes in your sleep routine, going on vacation, changing your diet, or holidays. ? New over-the-counter or prescription medicines. ? Illness, stress, or anxiety. ? Changes in the time that you took your medicine or insulin. ? Changes in your meals, such as skipping a meal, having a late meal, or dining out. ? Eating things that may affect blood glucose, such as snacks, meal portions that are larger than normal, drinks that contain sugar, or eating less than usual.   Why do I need correction insulin if I do not have diabetes? If you do not have diabetes, your health care provider may prescribe insulin because:  Keeping your blood glucose in the target range is important for your overall health.  You are taking medicines that cause your blood glucose to be higher than normal. Contact a health care provider if:  You have high blood glucose that you are not able to correct with correction insulin.  You develop a low blood glucose that you are not able to treat yourself.  Your blood glucose is often too low. Get help right away if:  You become unresponsive. If this happens, someone else should call emergency services (911 in the [place] right away.  Your blood glucose is lower than 54 mg/dL (3.0 mmol/L).  You become confused or you have trouble thinking clearly.  You have difficulty  breathing. Summary  Correction insulin is primarily used in diabetes management. It can also be prescribed for people who do not have diabetes.  Correction insulin is a small amount of insulin that can be used to lower your blood glucose if it is too high. It brings your glucose level to the target range.  You will be instructed to check your blood glucose at certain times of the day and to use correction insulin as needed. Always keep a log of your blood glucose values and the amount of insulin you take.  Talk with your health care provider or pharmacist about the type of correction insulin to take and when to take it. This information is not intended to replace advice given to you by your health care provider. Make sure you discuss any questions you have with your health care provider. Document Revised: 09/16/2019 Document Reviewed: 09/16/2019 Elsevier Patient Education  2021 [place].   Correction Insulin Correction insulin, also called a supplemental dose, is a small amount of insulin that can be used to lower your blood sugar (glucose) if it is too high. This dose brings your glucose level back to the target range. You will be instructed to check your glucose at certain times of the day and to use correction insulin as needed to lower your blood glucose. Correction insulin is primarily used as part of diabetes management. It may also be prescribed for people who do not have diabetes. What is a correction scale? A correction scale, also called a sliding scale, is prescribed by your health care provider to help you determine when you need correction insulin. Your correction scale is based on your individual treatment goals, and it has two parts:  Ranges of blood glucose levels.  How much correction insulin to give yourself if your blood sugar falls within a certain range. If your blood glucose is in your desired range, you will not need correction insulin. What type of insulin do I  need? You may be prescribed rapid-acting or short-acting insulin as correction insulin. Talk with your health care provider or pharmacist about which type of correction insulin to take and when to take it. Rapid-acting insulin This insulin:  Starts working in the body (onset) in as little as 15 minutes.  Is at its highest strength (peak) in 1.5-2 hours.  Lasts (duration) for 4-5 hours. Short-acting insulin This insulin:  Starts working in the body (onset) in about 30 minutes.  Is at its highest strength (peak) in 2-3 hours.  Lasts (duration) for 3-6 hours. How do I manage my blood glucose with correction insulin? Giving a correction dose  Check your blood glucose as directed by your health care provider.  Use your correction scale to find the range that your blood glucose is in.  Identify the units of insulin that match your blood glucose range.  Give yourself the dose of correction insulin that your health care provider has prescribed in your correction scale. Always make sure you are using the right type of insulin.   Keeping a blood glucose log  Write down  your blood glucose test results and the amount of insulin that you give yourself. Do this every time you check blood glucose or take insulin. Bring this log with you to your medical visits. This information will help your health care provider manage your medicines.  Note anything that may affect your blood glucose, such as: ? Changes in normal exercise or activity. ? Changes in your normal schedule, such as changes in your sleep routine, going on vacation, changing your diet, or holidays. ? New over-the-counter or prescription medicines. ? Illness, stress, or anxiety. ? Changes in the time that you took your medicine or insulin. ? Changes in your meals, such as skipping a meal, having a late meal, or dining out. ? Eating things that may affect blood glucose, such as snacks, meal portions that are larger than normal, drinks  that contain sugar, or eating less than usual.   Why do I need correction insulin if I do not have diabetes? If you do not have diabetes, your health care provider may prescribe insulin because:  Keeping your blood glucose in the target range is important for your overall health.  You are taking medicines that cause your blood glucose to be higher than normal. Contact a health care provider if:  You have high blood glucose that you are not able to correct with correction insulin.  You develop a low blood glucose that you are not able to treat yourself.  Your blood glucose is often too low. Get help right away if:  You become unresponsive. If this happens, someone else should call emergency services (911 in the [place] right away.  Your blood glucose is lower than 54 mg/dL (3.0 mmol/L).  You become confused or you have trouble thinking clearly.  You have difficulty breathing. Summary  Correction insulin is primarily used in diabetes management. It can also be prescribed for people who do not have diabetes.  Correction insulin is a small amount of insulin that can be used to lower your blood glucose if it is too high. It brings your glucose level to the target range.  You will be instructed to check your blood glucose at certain times of the day and to use correction insulin as needed. Always keep a log of your blood glucose values and the amount of insulin you take.  Talk with your health care provider or pharmacist about the type of correction insulin to take and when to take it. This information is not intended to replace advice given to you by your health care provider. Make sure you discuss any questions you have with your health care provider. Document Revised: 09/16/2019 Document Reviewed: 09/16/2019 Elsevier Patient Education  2021 [place]. 1. Take medications as prescribed. 2. Start checking blood sugars 3-4 times per day as discussed. 3. Make appointment with primary  doctor for next 2 weeks, take blood sugar readings with you. 4. Call doctor if blood sugars consistently >250 mg/dL or if you have a blood sugar <70 once per week or more. 5. Begin looking at labels for total carbohydrates, especially in beverages. 6. Videos were sent. 7. Consider endocrinology to help further assist with diabetes.  Local Endocrinologists [facility] [phone]) 1. Dr. Philemon Kingdom 2. Dr. Janie [facility] [phone]) 1. Dr. Delrae Rend [facility] [phone]) 1. Dr. Jacelyn Pi 2. Dr. Anda Kraft [facility] [phone]) 1. Dr. Daneil Dolin [facility] [phone]) [[place] office]  [phone]) [[place] office] 1. Dr. Lenna Sciara [place] 2. Dr. Marcello Moores  [facility]) [phone]) 1. Autumn Hudnall Ronnald Ramp), PA 2. Dr. Amalia Greenhouse 3. Dr. Marsh Dolly. [facility] [phone]) 1. Dr. Glade Lloyd [facility] [phone]) 1. Dr. Orville Govern 2. Dr. Lelon Huh 3. Dr. Jerelene Redden 4. Alwyn Ren, FNP Dr. Carolynn Serve. [facility] in [place] [phone]   Insulin Injection Instructions, Single Insulin Dose, Adult There are many different types of insulin. The type of insulin that you take may determine how many injections you give yourself and when you need to give the injections. Supplies needed:  Soap and water.  A new, unused insulin syringe.  Insulin medicine in small bottles (vials).  Alcohol wipes.  A disposal container for sharp items (sharps container), such as an empty plastic bottle with a cover. How to choose a site for injection The body absorbs insulin differently, depending on where the insulin is injected (injection site). It is best to inject insulin into the same body area each time (for example, always in the abdomen), but you should use a  different spot in that area for each injection. Do not inject the insulin in the same spot each time. There are five main areas that can be used for injecting. These areas are:  Abdomen. This is the preferred area.  Front of thigh.  Upper, outer side of thigh.  Upper, outer side of arm.  Upper, outer part of buttock.   How to give a single-dose insulin injection Get ready 1. Wash your hands with soap and water. If soap and water are not available, use hand sanitizer. 2. Test your blood sugar (glucose) level and write down that number. Follow any instructions from your health care provider about what to do if your blood glucose level is higher or lower than your normal range. 3. Use a new, unused insulin syringe each time you need to inject insulin. 4. Check to make sure you have the correct type of insulin syringe for the concentration of insulin that you are using. 5. Check the expiration date and the type of insulin that you are using. 6. If you are using CLEAR insulin, check to see that it is clear and free of clumps. 7. If you are using CLOUDY insulin, mix it by gently rolling the insulin vial between your palms several times. Do not shake the vial. 8. Remove the plastic pop-top covering from the vial of insulin. This type of covering is present on a vial when it is new. 9. Use an alcohol wipe to clean the rubber top of the vial. 10. Remove the plastic cover from the syringe needle. Do not let the needle touch anything. Push air into the vial 1. To bring (draw up) air into the syringe, slowly pull back on the syringe plunger. Stop pulling the plunger when the dose indicator gets to the number of units that you will be using. 2. While you keep the vial right-side-up, poke the needle through the rubber top of the vial. Do not turn the vial upside down to do this. 3. Push the plunger all the way into the syringe. Doing that will push air into the vial. 4. Do not take the needle out of the  vial yet. Fill the syringe 1. While the needle is still in the vial, turn the vial upside down and hold it at eye level. 2. Slowly pull back on the plunger. Stop pulling the plunger when the dose indicator gets to the desired number  of units. 3. If you see air bubbles in the syringe, slowly move the plunger up and down 2 or 3 times to make them go away. ? If you had to move the plunger to get rid of air bubbles, pull back the plunger until the dose indicator returns to the correct dose. 4. Remove the needle from the vial. Do not let the needle touch anything.   Inject the insulin 1. Use an alcohol wipe to clean the site where you will be inserting the needle. Let the site air-dry. 2. Hold the syringe in your writing hand like a pencil. 3. Use your other hand to pinch and hold about an inch (2.5 cm) of skin. Do not directly touch the cleaned part of the skin. 4. Gently but quickly, put the needle straight into the skin. The needle should be at a 90-degree angle (perpendicular) to the skin. 5. Push the needle in as far as it will go (to the hub). 6. When the needle is completely inserted into the skin, let go of the skin that you are pinching. Continue to hold the syringe in place with your writing hand. 7. Use your thumb or index finger of your writing hand to push the plunger all the way into the syringe to inject the insulin. 8. Wait 5 seconds, then pull the needle straight out of the skin. This will allow all of the insulin to go from the syringe and needle into your body. 9. If bleeding occurs, press and hold gauze over the injection site until any bleeding stops. Do not rub the area. 10. Do not put the plastic cover back on the needle. 11. Discard the syringe and needle directly into a sharps container.   How to throw away supplies  Discard all used needles in a sharps container.  Follow the disposal regulations for the area where you live. Do not use any syringe or needle more than one  time.  Throw away empty vials in the regular trash. Questions to ask your health care provider  How often should I be taking insulin?  How often should I check my blood glucose?  What amount of insulin should I be taking at each time?  What are the side effects?  What should I do if my blood glucose is too high?  What should I do if my blood glucose is too low?  What should I do if I forget to take my insulin?  What number should I call if I have questions? Where to find more information  American Diabetes Association (ADA): diabetes.org  Association of Diabetes Care and Education Specialists (ADCES): diabeteseducator.org Summary  The type of insulin that you take may determine how many injections you give yourself and when you need to give the injections.  Before you give yourself an insulin injection, be sure to wash your hands and test your blood sugar (glucose) level. Write down that number.  Check the expiration date and the type of insulin that you are using.  It is best to inject insulin into the same body area each time (for example, always in the abdomen), but you should use a different spot in that area for each injection.  Do not use an insulin syringe more than one time. This information is not intended to replace advice given to you by your health care provider. Make sure you discuss any questions you have with your health care provider. Document Revised: 10/28/2019 Document Reviewed: 10/31/2019 Elsevier Patient Education  2021  [place]. Diabetes Mellitus and Sick Day Management Blood sugar (glucose) can be difficult to control when you are sick. Common illnesses that can cause problems for people with diabetes (diabetes mellitus) include colds, fever, flu (influenza), nausea, vomiting, and diarrhea. These illnesses can cause stress and loss of body fluids (dehydration), and those issues can cause blood glucose levels to increase. Because of this, it is very  important to take your insulin and diabetes medicines and eat some form of carbohydrate when you are sick. You should make a plan for days when you are sick (sick day plan) as part of your diabetes management plan. You and your health care provider should make this plan in advance. The following guidelines are intended to help you manage an illness that lasts for about 24 hours or less. Your health care provider may also give you more specific instructions. How to manage your blood glucose  Check your blood glucose every 2-4 hours, or as often as told by your health care provider.  If you use insulin, take your usual dose. If your blood glucose continues to be too high, you may need to take an additional insulin dose as told by your health care provider.  Know your sick day treatment goals. Your target blood glucose levels may be different when you are sick.  If you use oral diabetes medicine, continue to take your medicines. Have a plan with your health care provider for these medicines while you are sick.  If you use injectable hormone medicines other than insulin to control your diabetes, have a plan with your health care provider for these medicines while you are sick.   Follow these instructions at home Check your ketones  If you have type 1 diabetes, check your urine ketones every 4 hours.  If you have type 2 diabetes, check your urine ketones as often as told by your health care provider. Eating and drinking  Drink enough fluid to keep your urine pale yellow. This is especially important if you have a fever, vomiting, or diarrhea. Those symptoms can lead to dehydration.  Follow instructions from your health care provider about beverages to avoid.  Do not drink alcohol, caffeine, or drinks that contain a lot of sugar.  You need to eat some form of carbohydrates when you are sick. Eat 45-50 grams (45-50 g) of carbohydrates every 3-4 hours until you feel better. All of the food choices  below contain about 15 g of carbohydrates. Plan ahead and keep some of these foods around so you have them if you get sick. ? 4-6 oz (120-177 mL) carbonated beverage that contains sugar, such as regular (not diet) soda. You may be able to drink carbonated beverages more easily if you open the beverage and let it sit at room temperature for a few minutes before drinking. ?  of a twin frozen ice pop. ? 4 oz (120 g) regular gelatin. ? 4 oz (120 mL) fruit juice. ? 4 oz (120 g) ice cream or frozen yogurt. ? 2 oz (60 g) sherbet. ? 1 slice bread or toast. ? 6 saltine crackers. ? 5 vanilla wafers. Medicines  Take-over-the-counter and prescription medicines only as told by your health care provider.  Check medicine labels for added sugars. Some medicines may contain sugar or types of sugars that can raise your blood glucose level. Questions to ask your health care provider  Should I adjust my diabetes medicines?  How often do I need to check my blood glucose?  What supplies do I need to manage my diabetes at home when I am sick?  What number can I call if I have questions?  What foods and drinks should I avoid? Contact a health care provider if:  You have been sick or have had a fever for 2 days or longer and you are not getting better.  Your blood glucose is at or above 240 mg/dl (13.3 mmol/L), even after you take an additional insulin dose.  You are unable to drink fluids without vomiting.  You have any of the following for more than 6 hours: ? Nausea. ? Vomiting. ? Diarrhea. Get help right away if:  You have difficulty breathing.  You have moderate or high ketone levels in your urine.  You have a change in how you think, feel, or act (mental status).  You develop symptoms of diabetic ketoacidosis. These include: ? Nausea. ? Vomiting. ? Excessive thirst. ? Excessive urination. ? Fruity or sweet smelling breath. ? Rapid breathing. ? Pain in the abdomen.  Your blood  glucose is lower than 41m/dl (3.0 mmol/L).  You used emergency glucagon to treat low blood glucose. These symptoms may represent a serious problem that is an emergency. Do not wait to see if the symptoms will go away. Get medical help right away. Call your local emergency services (911 in the [place] Do not drive yourself to the hospital. Summary  Blood sugar (glucose) can be difficult to control when you are sick. Common illnesses that can cause problems for people with diabetes (diabetes mellitus) include colds, fever, flu (influenza), nausea, vomiting, and diarrhea.  Illnesses can cause stress and loss of body fluids (dehydration), and those issues can cause blood glucose levels to increase.  Make a plan for days when you are sick (sick day plan) as part of your diabetes management plan. You and your health care provider should make this plan in advance.  It is very important to take your insulin and diabetes medicines and to eat some form of carbohydrate when you are sick.  Contact your health care provider if have problems managing your blood glucose levels when you are sick, or if you have been sick or had a fever for 2 days or longer and are not getting better. This information is not intended to replace advice given to you by your health care provider. Make sure you discuss any questions you have with your health care provider. Document Revised: 12/05/2019 Document Reviewed: 12/05/2019 Elsevier Patient Education  2021 [place] Blood Glucose Monitoring, Adult Monitoring your blood sugar (glucose) is an important part of managing your diabetes. Blood glucose monitoring involves checking your blood glucose as often as directed and keeping a log or record of your results over time. Checking your blood glucose regularly and keeping a blood glucose log can:  Help you and your health care provider adjust your diabetes management plan as needed, including your medicines or  insulin.  Help you understand how food, exercise, illnesses, and medicines affect your blood glucose.  Let you know what your blood glucose is at any time. You can quickly find out if you have low blood glucose (hypoglycemia) or high blood glucose (hyperglycemia). Your health care provider will set individualized treatment goals for you. Your goals will be based on your age, other medical conditions you have, and how you respond to diabetes treatment. Generally, the goal of treatment is to maintain the following blood glucose levels:  Before meals (preprandial): 80-130 mg/dL (4.4-7.2 mmol/L).  After  meals (postprandial): below 180 mg/dL (10 mmol/L).  A1C level: less than 7%. Supplies needed:  Blood glucose meter.  Test strips for your meter. Each meter has its own strips. You must use the strips that came with your meter.  A needle to prick your finger (lancet). Do not use a lancet more than one time.  A device that holds the lancet (lancing device).  A journal or log book to write down your results. How to check your blood glucose Checking your blood glucose 1. Wash your hands for at least 20 seconds with soap and water. 2. Prick the side of your finger (not the tip) with the lancet. Do not use the same finger consecutively. 3. Gently rub the finger until a small drop of blood appears. 4. Follow instructions that come with your meter for inserting the test strip, applying blood to the strip, and using your blood glucose meter. 5. Write down your result and any notes in your log.   Using alternative sites Some meters allow you to use areas of your body other than your finger (alternative sites) to test your blood. The most common alternative sites are the forearm, the thigh, and the palm of your hand. Alternative sites may not be as accurate as the fingers because blood flow is slower in those areas. This means that the result you get may be delayed, and it may be different from the  result that you would get from your finger. Use the finger only, and do not use alternative sites, if:  You think you have hypoglycemia.  You sometimes do not know that your blood glucose is getting low (hypoglycemia unawareness). General tips and recommendations Blood glucose log  Every time you check your blood glucose, write down your result. Also write down any notes about things that may be affecting your blood glucose, such as your diet and exercise for the day. This information can help you and your health care provider: ? Look for patterns in your blood glucose over time. ? Adjust your diabetes management plan as needed.  Check if your meter allows you to download your records to a computer or if there is an app for the meter. Most glucose meters store a record of glucose readings in the meter.   If you have type 1 diabetes:  Check your blood glucose 4 or more times a day if you are on intensive insulin therapy with multiple daily injections (MDI) or if you are using an insulin pump. Check your blood glucose: ? Before every meal and snack. ? Before bedtime.  Also check your blood glucose: ? If you have symptoms of hypoglycemia. ? After treating low blood glucose. ? Before doing activities that create a risk for injury, like driving or using machinery. ? Before and after exercise. ? Two hours after a meal. ? Occasionally between 2:00 a.m. and 3:00 a.m., as directed.  You may need to check your blood glucose more often, 6-10 times per day, if: ? You have diabetes that is not well controlled. ? You are ill. ? You have a history of severe hypoglycemia. ? You have hypoglycemia unawareness. If you have type 2 diabetes:  Check your blood glucose 2 or more times a day if you take insulin or other diabetes medicines.  Check your blood glucose 4 or more times a day if you are on intensive insulin therapy. Occasionally, you may also need to check your glucose between 2:00 a.m. and  3:00 a.m., as directed.  Also check your blood glucose: ? Before and after exercise. ? Before doing activities that create a risk for injury, like driving or using machinery.  You may need to check your blood glucose more often if: ? Your medicine is being adjusted. ? Your diabetes is not well controlled. ? You are ill. General tips  Make sure you always have your supplies with you.  After you use a few boxes of test strips, adjust (calibrate) your blood glucose meter by following instructions that came with your meter.  If you have questions or need help, all blood glucose meters have a 24-hour hotline phone number available that you can call. Also contact your health care provider with questions or concerns you may have. Where to find more information  The American Diabetes Association: www.diabetes.org  The Association of Diabetes Care & Education Specialists: www.diabeteseducator.org Contact a health care provider if:  Your blood glucose is at or above 240 mg/dL (13.3 mmol/L) for 2 days in a row.  You have been sick or have had a fever for 2 days or longer, and you are not getting better.  You have any of the following problems for more than 6 hours: ? You cannot eat or drink. ? You have nausea or vomiting. ? You have diarrhea. Get help right away if:  Your blood glucose is lower than 54 mg/dL (3 mmol/L).  You become confused, or you have trouble thinking clearly.  You have difficulty breathing.  You have moderate or large ketone levels in your urine. These symptoms may represent a serious problem that is an emergency. Do not wait to see if the symptoms will go away. Get medical help right away. Call your local emergency services (911 in the [place] Do not drive yourself to the hospital. Summary  Monitoring your blood glucose is an important part of managing your diabetes.  Blood glucose monitoring involves checking your blood glucose as often as directed and keeping  a log or record of your results over time.  Your health care provider will set individualized treatment goals for you. Your goals will be based on your age, other medical conditions you have, and how you respond to diabetes treatment.  Every time you check your blood glucose, write down your result. Also, write down any notes about things that may be affecting your blood glucose, such as your diet and exercise for the day. This information is not intended to replace advice given to you by your health care provider. Make sure you discuss any questions you have with your health care provider. Document Revised: 08/12/2020 Document Reviewed: 08/12/2020 Elsevier Patient Education  2021 [place]. Hypoglycemia Hypoglycemia is when the sugar (glucose) level in your blood is too low. Low blood sugar can happen to people who have diabetes and people who do not have diabetes. Low blood sugar can happen quickly, and it can be an emergency. What are the causes? This condition happens most often in people who have diabetes and may be caused by:  Diabetes medicine.  Not eating enough, or not eating often enough.  Doing more physical activity.  Drinking alcohol on an empty stomach. If you do not have diabetes, hypoglycemia may be caused by:  A tumor in the pancreas.  Not eating enough, or not eating for long periods at a time (fasting).  A very bad infection or illness.  Problems after having weight loss (bariatric) surgery.  Kidney failure or liver failure.  Certain medicines. What increases the risk? This  condition is more likely to develop in people who:  Have diabetes and take medicines to lower their blood sugar.  Abuse alcohol.  Have a very bad illness. What are the signs or symptoms? Symptoms depend on whether your low blood sugar is mild, moderate, or very low. Mild  Hunger.  Feeling worried or nervous (anxious).  Sweating and feeling clammy.  Feeling dizzy or  light-headed.  Being sleepy or having trouble sleeping.  Feeling like you may vomit (nauseous).  A fast heartbeat.  A headache.  Blurry vision.  Being irritable or grouchy.  Tingling or loss of feeling (numbness) around your mouth, lips, or tongue.  Trouble with moving (coordination). Moderate  Confusion and poor judgment.  Behavior changes.  Weakness.  Uneven heartbeats. Very low Very low blood sugar (severe hypoglycemia) is a medical emergency. It can cause:  Fainting.  Jerky movements that you cannot control (seizure).  Loss of consciousness (coma).  Death. How is this treated? Treating low blood sugar Low blood sugar is often treated by eating or drinking something sugary right away. The snack should contain 15 grams of a fast-acting carb (carbohydrate). Options include:  4 oz (120 mL) of fruit juice.  4-6 oz (120-150 mL) of regular soda (not diet soda).  8 oz (240 mL) of low-fat milk.  Several pieces of hard candy. Check food labels to find out how many to eat for 15 grams.  1 Tbsp (15 mL) of sugar or honey. Treating low blood sugar if you have diabetes If you can think clearly and swallow safely, follow the 15:15 rule:  Take 15 grams of a fast-acting carb. Talk with your doctor about how much you should take.  Always keep a source of fast-acting carb with you, such as: ? Sugar tablets (glucose pills). Take 4 pills. ? Several pieces of hard candy. Check food labels to see how many pieces to eat for 15 grams. ? 4 oz (120 mL) of fruit juice. ? 4-6 oz (120-150 mL) of regular (not diet) soda. ? 1 Tbsp (15 mL) of honey or sugar.  Check your blood sugar 15 minutes after you take the carb.  If your blood sugar is still at or below 70 mg/dL (3.9 mmol/L), take 15 grams of a carb again.  If your blood sugar does not go above 70 mg/dL (3.9 mmol/L) after 3 tries, get help right away.  After your blood sugar goes back to normal, eat a meal or a snack within  1 hour.   Treating very low blood sugar If your blood sugar is at or below 54 mg/dL (3 mmol/L), you have very low blood sugar, or severe hypoglycemia. This is an emergency. Get medical help right away. If you have very low blood sugar and you cannot eat or drink, you will need to be given a hormone called glucagon. A family member or friend should learn how to check your blood sugar and how to give you glucagon. Ask your doctor if you need to have an emergency glucagon kit at home. Very low blood sugar may also need to be treated in a hospital. Follow these instructions at home: General instructions  Take over-the-counter and prescription medicines only as told by your doctor.  Stay aware of your blood sugar as told by your doctor.  If you drink alcohol: ? Limit how much you use to:  0-1 drink a day for nonpregnant women.  0-2 drinks a day for men. ? Be aware of how much alcohol is  in your drink. In the [place] one drink equals one 12 oz bottle of beer (355 mL), one 5 oz glass of wine (148 mL), or one 1 oz glass of hard liquor (44 mL).  Keep all follow-up visits as told by your doctor. This is important. If you have diabetes:  Always have a rapid-acting carb (15 grams) option with you to treat low blood sugar.  Follow your diabetes care plan as told by your doctor. Make sure you: ? Know the symptoms of low blood sugar. ? Check your blood sugar as often as told by your doctor. Always check it before and after exercise. ? Always check your blood sugar before you drive. ? Take your medicines as told. ? Follow your meal plan. ? Eat on time. Do not skip meals.  Share your diabetes care plan with: ? Your work or school. ? People you live with.  Carry a card or wear jewelry that says you have diabetes.   Contact a doctor if:  You have trouble keeping your blood sugar in your target range.  You have low blood sugar often. Get help right away if:  You still have symptoms after you  eat or drink something that contains 15 grams of fast-acting carb and you cannot get your blood sugar above 70 mg/dL by following the 15:15 rule.  Your blood sugar is at or below 54 mg/dL (3 mmol/L).  You have a seizure.  You faint. These symptoms may be an emergency. Do not wait to see if the symptoms will go away. Get medical help right away. Call your local emergency services (911 in the [place] Do not drive yourself to the hospital. Summary  Hypoglycemia happens when the level of sugar (glucose) in your blood is too low.  Low blood sugar can happen to people who have diabetes and people who do not have diabetes. Low blood sugar can happen quickly, and it can be an emergency.  Make sure you know the symptoms of low blood sugar and know how to treat it.  Always keep a source of sugar (fast-acting carb) with you to treat low blood sugar. This information is not intended to replace advice given to you by your health care provider. Make sure you discuss any questions you have with your health care provider. Document Revised: 10/09/2019 Document Reviewed: 10/09/2019 Elsevier Patient Education  2021 [place]. Hyperglycemia Hyperglycemia occurs when the level of sugar (glucose) in the blood is too high. Glucose is a type of sugar that provides the body's main source of energy. Certain hormones (insulin and glucagon) control the level of glucose in the blood. Insulin lowers blood glucose, and glucagon increases blood glucose. Hyperglycemia can result from not having enough insulin in the bloodstream, or from the body not responding normally to insulin. Hyperglycemia occurs most often in people who have diabetes (diabetes mellitus), but it can happen in people who do not have diabetes. It can develop quickly, and it can be life-threatening if it causes you to become severely dehydrated (diabetic ketoacidosis or hyperglycemic hyperosmolar state). Severe hyperglycemia is a medical emergency. For  most people with diabetes, a blood glucose level above 240 mg/dL is considered hyperglycemia. What are the causes? If you have diabetes, hyperglycemia may be caused by:  Medicines that increase blood glucose or affect your diabetes control.  Getting less physical activity.  Eating more than planned.  Being sick or injured, having an infection, or having surgery.  Stress.  Not giving yourself enough insulin (  if you are taking insulin). If you have undiagnosed diabetes, this may be the reason you have hyperglycemia. If you do not have diabetes, hyperglycemia may be caused by:  Certain medicines, including: ? Steroid medicines. ? Beta-blockers. ? Epinephrine. ? Thiazide diuretics.  Stress.  Having a serious illness, an infection, or surgery.  Diseases of the pancreas. What increases the risk? Hyperglycemia is more likely to develop in people who have risk factors for diabetes, such as:  Having a family member with diabetes.  Certain conditions in which the body's disease-fighting system (immune system) attacks itself (autoimmune disorders).  Being overweight or obese.  Having an inactive (sedentary) lifestyle.  Having been diagnosed with insulin resistance.  Having a history of prediabetes, gestational diabetes, or polycystic ovarian syndrome (PCOS). What are the signs or symptoms? Hyperglycemia may not cause any symptoms. If you do have symptoms, they may include:  Increased thirst.  Needing to urinate more often than usual.  Hunger.  Feeling very tired.  Blurry vision. Other symptoms may develop if hyperglycemia gets worse, such as:  Dry mouth.  Abdominal pain.  Loss of appetite.  Fruity-smelling breath.  Weakness.  Unexpected weight loss.  Tingling or numbness in the hands or feet.  Headache.  Cuts or bruises that are slow to heal. How is this diagnosed? Hyperglycemia is diagnosed with a blood test to measure your blood glucose level. This  blood test is usually done while you are having symptoms. Your health care provider may also do a physical exam and review your medical history. You may have more tests to determine the cause of your hyperglycemia, such as:  A fasting blood glucose (FBG) test. You will not be allowed to eat (you will fast) for at least 8 hours before a blood sample is taken.  An A1C blood test. This provides information about blood glucose control over the previous 2-3 months.  An oral glucose tolerance test (OGTT). This measures your blood glucose at two times: ? After fasting. This is your baseline blood glucose level. ? 2 hours after drinking a beverage that contains glucose. How is this treated? Treatment depends on the cause of your hyperglycemia. Treatment may include:  Taking medicine to regulate your blood glucose levels. If you take insulin or other diabetes medicines, your medicine or dosage may be adjusted.  Lifestyle changes, such as exercising more, eating healthier foods, or losing weight.  Treating an illness or infection.  Checking your blood glucose more often.  Stopping or reducing steroid medicines. If your hyperglycemia becomes severe and it results in diabetic ketoacidosis or hyperglycemic hyperosmolar state, you must be hospitalized and given IV fluids and IV insulin. Follow these instructions at home: General instructions  Take over-the-counter and prescription medicines only as told by your health care provider.  Do not use any products that contain nicotine or tobacco. These products include cigarettes, chewing tobacco, and vaping devices, such as e-cigarettes. If you need help quitting, ask your health care provider.  If you drink alcohol: ? Limit how much you have to:  0-1 drink a day for women who are not pregnant.  0-2 drinks a day for men. ? Know how much alcohol is in a drink. In the [place] one drink equals one 12 oz bottle of beer (355 mL), one 5 oz glass of wine  (148 mL), or one 1 oz glass of hard liquor (44 mL).  Learn to manage stress. If you need help with this, ask your health care provider.  Do exercises as told by your health care provider.  Keep all follow-up visits. This is important. Eating and drinking  Maintain a healthy weight.  Stay hydrated, especially when you exercise, get sick, or spend time in hot temperatures.  Drink enough fluid to keep your urine pale yellow.   If you have diabetes:  Know the symptoms of hyperglycemia.  Follow your diabetes management plan as told by your health care provider. Make sure you: ? Take your insulin and medicines as told. ? Follow your exercise plan. ? Follow your meal plan. Eat on time, and do not skip meals. ? Check your blood glucose as often as told. Make sure to check your blood glucose before and after exercise. If you exercise longer or in a different way, check your blood glucose more often. ? Follow your sick day plan whenever you cannot eat or drink normally. Make this plan in advance with your health care provider.  Share your diabetes management plan with people in your workplace, school, and household.  Check your urine for ketones when you are ill and as told by your health care provider.  Carry a medical alert card or wear medical alert jewelry.   Where to find more information American Diabetes Association: www.diabetes.org Contact a health care provider if:  Your blood glucose is at or above 240 mg/dL (13.3 mmol/L) for 2 days in a row.  You have problems keeping your blood glucose in your target range.  You have frequent episodes of hyperglycemia.  You have signs of illness, such as nausea, vomiting, or fever. Get help right away if:  Your blood glucose monitor reads "high" even when you are taking insulin.  You have trouble breathing.  You have a change in how you think, feel, or act (mental status).  You have nausea or vomiting that does not go away. These  symptoms may represent a serious problem that is an emergency. Do not wait to see if the symptoms will go away. Get medical help right away. Call your local emergency services (911 in the [place] Do not drive yourself to the hospital. Summary  Hyperglycemia occurs when the level of sugar (glucose) in the blood is too high.  Hyperglycemia can happen with or without diabetes, and severe hyperglycemia can be life-threatening.  Hyperglycemia is diagnosed with a blood test to measure your blood glucose level. This blood test is usually done while you are having symptoms. Your health care provider may also do a physical exam and review your medical history.  If you have diabetes, follow your diabetes management plan as told by your health care provider.  Contact your health care provider if you have problems keeping your blood glucose in your target range. This information is not intended to replace advice given to you by your health care provider. Make sure you discuss any questions you have with your health care provider. Document Revised: 08/28/2020 Document Reviewed: 08/28/2020 Elsevier Patient Education  2021 [place]. Hemoglobin A1C Test Why am I having this test? You may have the hemoglobin A1C test (A1C test) done to:  Evaluate your risk for developing diabetes (diabetes mellitus).  Diagnose diabetes.  Monitor long-term control of blood sugar (glucose) in people who have diabetes and help make treatment decisions. This test may be done with other blood glucose tests, such as fasting blood glucose and oral glucose tolerance tests. What is being tested? Hemoglobin is a type of protein in the blood that carries oxygen. Glucose attaches to hemoglobin  to form glycated hemoglobin. This test checks the amount of glycated hemoglobin in your blood, which is a good indicator of the average amount of glucose in your blood during the past 2-3 months. What kind of sample is taken? A blood sample  is required for this test. It is usually collected by inserting a needle into a blood vessel.   Tell a health care provider about:  All medicines you are taking, including vitamins, herbs, eye drops, creams, and over-the-counter medicines.  Any blood disorders you have.  Any surgeries you have had.  Any medical conditions you have.  Whether you are pregnant or may be pregnant. How are the results reported? Your results will be reported as a percentage that indicates how much of your hemoglobin has glucose attached to it (is glycated). Your health care provider will compare your results to normal ranges that were established after testing a large group of people (reference ranges). Reference ranges may vary among labs and hospitals. For this test, common reference ranges are:  Adult or child without diabetes: 4-5.6%.  Adult or child with diabetes and good blood glucose control: less than 7%. What do the results mean? If you have diabetes:  A result of less than 7% is considered normal, meaning that your blood glucose is well controlled.  A result higher than 7% means that your blood glucose is not well controlled, and your treatment plan may need to be adjusted. If you do not have diabetes:  A result within the reference range is considered normal, meaning that you are not at high risk for diabetes.  A result of 5.7-6.4% means that you have a high risk of developing diabetes, and you have prediabetes. Prediabetes is the condition of having a blood glucose level that is higher than it should be but not high enough for you to be diagnosed with diabetes. Having prediabetes puts you at risk for developing type 2 diabetes. You may have more tests, including a repeat A1C test.  Results of 6.5% or higher on two separate A1C tests mean that you have diabetes. You may have more tests to confirm the diagnosis. Abnormally low A1C values may be caused by:  Pregnancy.  Severe blood  loss.  Receiving donated blood (transfusions).  Low red blood cell count (anemia).  Long-term kidney failure.  Some unusual forms (variants) of hemoglobin. Talk with your health care provider about what your results mean. Questions to ask your health care provider Ask your health care provider, or the department that is doing the test:  When will my results be ready?  How will I get my results?  What are my treatment options?  What other tests do I need?  What are my next steps? Summary  The A1C test may be done to evaluate your risk for developing diabetes, to diagnose diabetes, and to monitor long-term control of blood sugar (glucose) in people who have diabetes and help make treatment decisions.  Hemoglobin is a type of protein in the blood that carries oxygen. Glucose attaches to hemoglobin to form glycated hemoglobin. This test checks the amount of glycated hemoglobin in your blood, which is a good indicator of the average amount of glucose in your blood during the past 2-3 months.  Talk with your health care provider about what your results mean. This information is not intended to replace advice given to you by your health care provider. Make sure you discuss any questions you have with your health care provider. Document Revised:  08/12/2020 Document Reviewed: 08/12/2020 Elsevier Patient Education  2021 [place].   Fingerstick glucose (sugar) goals for home: Before meals: 80-130 mg/dl 2-Hours after meals: less than 180 mg/dl Hemoglobin A1c goal: 7% or less  Insulin Pen Instructions:  1. Remove Insulin pen cap and clean pen 1st with alcohol rub and then clean skin 2nd with alcohol rub 2. Twist insulin pen needle onto pen (right tighty) 3. Remove outer cap and inner cap from needle 4. Dial pen to 2 units and perform prime- press pen to zero and make sure liquid (insulin) comes out of the needle 5. Dial pen to your dose and perform injection into your abdomen 6.  Hold needle in skin for 10 seconds after injection 7. Remove needle from insulin pen and discard 8. Place cap back on insulin pen and store safely (at room temperature) 9. Store unused pens in refrigerator and can keep opened insulin pen at room temperature (discard used pen after 30 days)

## 2022-10-20 ENCOUNTER — Encounter (HOSPITAL_COMMUNITY): Payer: Self-pay

## 2022-10-20 ENCOUNTER — Emergency Department (HOSPITAL_BASED_OUTPATIENT_CLINIC_OR_DEPARTMENT_OTHER): Payer: Medicare HMO

## 2022-10-20 ENCOUNTER — Other Ambulatory Visit: Payer: Self-pay

## 2022-10-20 ENCOUNTER — Encounter (HOSPITAL_BASED_OUTPATIENT_CLINIC_OR_DEPARTMENT_OTHER): Payer: Self-pay | Admitting: Emergency Medicine

## 2022-10-20 ENCOUNTER — Inpatient Hospital Stay (HOSPITAL_BASED_OUTPATIENT_CLINIC_OR_DEPARTMENT_OTHER)
Admission: EM | Admit: 2022-10-20 | Discharge: 2022-10-28 | DRG: 853 | Disposition: A | Discharge status: Other Acute Inpatient Hospital | Payer: Medicare HMO | Attending: Critical Care Medicine | Admitting: Critical Care Medicine

## 2022-10-20 DIAGNOSIS — Z1152 Encounter for screening for COVID-19: Secondary | ICD-10-CM | POA: Diagnosis not present

## 2022-10-20 DIAGNOSIS — K7200 Acute and subacute hepatic failure without coma: Secondary | ICD-10-CM | POA: Diagnosis present

## 2022-10-20 DIAGNOSIS — Z9221 Personal history of antineoplastic chemotherapy: Secondary | ICD-10-CM

## 2022-10-20 DIAGNOSIS — J690 Pneumonitis due to inhalation of food and vomit: Secondary | ICD-10-CM | POA: Diagnosis present

## 2022-10-20 DIAGNOSIS — N19 Unspecified kidney failure: Secondary | ICD-10-CM

## 2022-10-20 DIAGNOSIS — I427 Cardiomyopathy due to drug and external agent: Secondary | ICD-10-CM | POA: Diagnosis present

## 2022-10-20 DIAGNOSIS — R739 Hyperglycemia, unspecified: Secondary | ICD-10-CM | POA: Diagnosis not present

## 2022-10-20 DIAGNOSIS — G9340 Encephalopathy, unspecified: Secondary | ICD-10-CM | POA: Diagnosis not present

## 2022-10-20 DIAGNOSIS — Z91148 Patient's other noncompliance with medication regimen for other reason: Secondary | ICD-10-CM

## 2022-10-20 DIAGNOSIS — I110 Hypertensive heart disease with heart failure: Secondary | ICD-10-CM | POA: Diagnosis present

## 2022-10-20 DIAGNOSIS — Z833 Family history of diabetes mellitus: Secondary | ICD-10-CM

## 2022-10-20 DIAGNOSIS — I2510 Atherosclerotic heart disease of native coronary artery without angina pectoris: Secondary | ICD-10-CM | POA: Diagnosis present

## 2022-10-20 DIAGNOSIS — I4892 Unspecified atrial flutter: Secondary | ICD-10-CM | POA: Diagnosis present

## 2022-10-20 DIAGNOSIS — E870 Hyperosmolality and hypernatremia: Secondary | ICD-10-CM | POA: Diagnosis present

## 2022-10-20 DIAGNOSIS — R6521 Severe sepsis with septic shock: Secondary | ICD-10-CM | POA: Diagnosis present

## 2022-10-20 DIAGNOSIS — G9341 Metabolic encephalopathy: Secondary | ICD-10-CM | POA: Diagnosis not present

## 2022-10-20 DIAGNOSIS — I468 Cardiac arrest due to other underlying condition: Secondary | ICD-10-CM | POA: Diagnosis not present

## 2022-10-20 DIAGNOSIS — I63541 Cerebral infarction due to unspecified occlusion or stenosis of right cerebellar artery: Secondary | ICD-10-CM | POA: Diagnosis not present

## 2022-10-20 DIAGNOSIS — I4819 Other persistent atrial fibrillation: Secondary | ICD-10-CM | POA: Diagnosis present

## 2022-10-20 DIAGNOSIS — D696 Thrombocytopenia, unspecified: Secondary | ICD-10-CM | POA: Diagnosis present

## 2022-10-20 DIAGNOSIS — G253 Myoclonus: Secondary | ICD-10-CM | POA: Diagnosis not present

## 2022-10-20 DIAGNOSIS — J9601 Acute respiratory failure with hypoxia: Secondary | ICD-10-CM | POA: Diagnosis present

## 2022-10-20 DIAGNOSIS — G40909 Epilepsy, unspecified, not intractable, without status epilepticus: Secondary | ICD-10-CM | POA: Diagnosis present

## 2022-10-20 DIAGNOSIS — E876 Hypokalemia: Secondary | ICD-10-CM | POA: Diagnosis present

## 2022-10-20 DIAGNOSIS — A419 Sepsis, unspecified organism: Secondary | ICD-10-CM | POA: Diagnosis present

## 2022-10-20 DIAGNOSIS — L8990 Pressure ulcer of unspecified site, unspecified stage: Secondary | ICD-10-CM | POA: Insufficient documentation

## 2022-10-20 DIAGNOSIS — Z87891 Personal history of nicotine dependence: Secondary | ICD-10-CM

## 2022-10-20 DIAGNOSIS — Y92239 Unspecified place in hospital as the place of occurrence of the external cause: Secondary | ICD-10-CM | POA: Diagnosis not present

## 2022-10-20 DIAGNOSIS — I420 Dilated cardiomyopathy: Secondary | ICD-10-CM | POA: Diagnosis present

## 2022-10-20 DIAGNOSIS — I4891 Unspecified atrial fibrillation: Principal | ICD-10-CM | POA: Diagnosis present

## 2022-10-20 DIAGNOSIS — R570 Cardiogenic shock: Secondary | ICD-10-CM | POA: Diagnosis present

## 2022-10-20 DIAGNOSIS — D630 Anemia in neoplastic disease: Secondary | ICD-10-CM | POA: Diagnosis present

## 2022-10-20 DIAGNOSIS — G931 Anoxic brain damage, not elsewhere classified: Secondary | ICD-10-CM | POA: Diagnosis not present

## 2022-10-20 DIAGNOSIS — N170 Acute kidney failure with tubular necrosis: Secondary | ICD-10-CM

## 2022-10-20 DIAGNOSIS — Z95810 Presence of automatic (implantable) cardiac defibrillator: Secondary | ICD-10-CM

## 2022-10-20 DIAGNOSIS — Z79899 Other long term (current) drug therapy: Secondary | ICD-10-CM

## 2022-10-20 DIAGNOSIS — Z794 Long term (current) use of insulin: Secondary | ICD-10-CM

## 2022-10-20 DIAGNOSIS — E1165 Type 2 diabetes mellitus with hyperglycemia: Secondary | ICD-10-CM | POA: Diagnosis not present

## 2022-10-20 DIAGNOSIS — J810 Acute pulmonary edema: Secondary | ICD-10-CM | POA: Diagnosis not present

## 2022-10-20 DIAGNOSIS — I469 Cardiac arrest, cause unspecified: Secondary | ICD-10-CM

## 2022-10-20 DIAGNOSIS — I5021 Acute systolic (congestive) heart failure: Secondary | ICD-10-CM | POA: Diagnosis not present

## 2022-10-20 DIAGNOSIS — R4182 Altered mental status, unspecified: Secondary | ICD-10-CM | POA: Diagnosis not present

## 2022-10-20 DIAGNOSIS — J9600 Acute respiratory failure, unspecified whether with hypoxia or hypercapnia: Secondary | ICD-10-CM | POA: Diagnosis not present

## 2022-10-20 DIAGNOSIS — D6489 Other specified anemias: Secondary | ICD-10-CM | POA: Diagnosis present

## 2022-10-20 DIAGNOSIS — Z9911 Dependence on respirator [ventilator] status: Secondary | ICD-10-CM | POA: Diagnosis not present

## 2022-10-20 DIAGNOSIS — T451X5A Adverse effect of antineoplastic and immunosuppressive drugs, initial encounter: Secondary | ICD-10-CM | POA: Diagnosis present

## 2022-10-20 DIAGNOSIS — C9201 Acute myeloblastic leukemia, in remission: Secondary | ICD-10-CM | POA: Diagnosis present

## 2022-10-20 DIAGNOSIS — I5023 Acute on chronic systolic (congestive) heart failure: Secondary | ICD-10-CM | POA: Diagnosis present

## 2022-10-20 DIAGNOSIS — N179 Acute kidney failure, unspecified: Secondary | ICD-10-CM | POA: Diagnosis not present

## 2022-10-20 DIAGNOSIS — T380X5A Adverse effect of glucocorticoids and synthetic analogues, initial encounter: Secondary | ICD-10-CM | POA: Diagnosis not present

## 2022-10-20 DIAGNOSIS — Z8249 Family history of ischemic heart disease and other diseases of the circulatory system: Secondary | ICD-10-CM

## 2022-10-20 DIAGNOSIS — E872 Acidosis, unspecified: Secondary | ICD-10-CM | POA: Diagnosis present

## 2022-10-20 DIAGNOSIS — I462 Cardiac arrest due to underlying cardiac condition: Secondary | ICD-10-CM | POA: Diagnosis not present

## 2022-10-20 LAB — I-STAT VENOUS BLOOD GAS, ED
Acid-Base Excess: 1 mmol/L (ref 0.0–2.0)
Bicarbonate: 25.1 mmol/L (ref 20.0–28.0)
Calcium, Ion: 1.03 mmol/L — ABNORMAL LOW (ref 1.15–1.40)
HCT: 42 % (ref 39.0–52.0)
Hemoglobin: 14.3 g/dL (ref 13.0–17.0)
O2 Saturation: 65 %
Patient temperature: 97.6
Potassium: 3.1 mmol/L — ABNORMAL LOW (ref 3.5–5.1)
Sodium: 138 mmol/L (ref 135–145)
TCO2: 26 mmol/L (ref 22–32)
pCO2, Ven: 35.3 mmHg — ABNORMAL LOW (ref 44–60)
pH, Ven: 7.458 — ABNORMAL HIGH (ref 7.25–7.43)
pO2, Ven: 31 mmHg — CL (ref 32–45)

## 2022-10-20 LAB — COMPREHENSIVE METABOLIC PANEL
ALT: 59 U/L — ABNORMAL HIGH (ref 0–44)
AST: 53 U/L — ABNORMAL HIGH (ref 15–41)
Albumin: 3.2 g/dL — ABNORMAL LOW (ref 3.5–5.0)
Alkaline Phosphatase: 32 U/L — ABNORMAL LOW (ref 38–126)
Anion gap: 9 (ref 5–15)
BUN: 23 mg/dL (ref 8–23)
CO2: 24 mmol/L (ref 22–32)
Calcium: 7.7 mg/dL — ABNORMAL LOW (ref 8.9–10.3)
Chloride: 105 mmol/L (ref 98–111)
Creatinine, Ser: 0.64 mg/dL (ref 0.61–1.24)
GFR, Estimated: >60 mL/min (ref >60)
Glucose, Bld: 177 mg/dL — ABNORMAL HIGH (ref 70–99)
Potassium: 2.8 mmol/L — ABNORMAL LOW (ref 3.5–5.1)
Sodium: 138 mmol/L (ref 135–145)
Total Bilirubin: 1.6 mg/dL — ABNORMAL HIGH (ref 0.3–1.2)
Total Protein: 7.1 g/dL (ref 6.5–8.1)

## 2022-10-20 LAB — CBC WITH DIFFERENTIAL/PLATELET
Abs Immature Granulocytes: 0.04 10*3/uL (ref 0.00–0.07)
Basophils Absolute: 0 10*3/uL (ref 0.0–0.1)
Basophils Relative: 0 %
Eosinophils Absolute: 0 10*3/uL (ref 0.0–0.5)
Eosinophils Relative: 0 %
HCT: 37.5 % — ABNORMAL LOW (ref 39.0–52.0)
Hemoglobin: 13.1 g/dL (ref 13.0–17.0)
Immature Granulocytes: 0 %
Lymphocytes Relative: 18 %
Lymphs Abs: 1.9 10*3/uL (ref 0.7–4.0)
MCH: 29.8 pg (ref 26.0–34.0)
MCHC: 34.9 g/dL (ref 30.0–36.0)
MCV: 85.2 fL (ref 80.0–100.0)
Monocytes Absolute: 0.9 10*3/uL (ref 0.1–1.0)
Monocytes Relative: 9 %
Neutro Abs: 7.7 10*3/uL (ref 1.7–7.7)
Neutrophils Relative %: 73 %
Platelets: 236 10*3/uL (ref 150–400)
RBC: 4.4 MIL/uL (ref 4.22–5.81)
RDW: 15.4 % (ref 11.5–15.5)
WBC: 10.6 10*3/uL — ABNORMAL HIGH (ref 4.0–10.5)
nRBC: 0 % (ref 0.0–0.2)

## 2022-10-20 LAB — MAGNESIUM: Magnesium: 1.4 mg/dL — ABNORMAL LOW (ref 1.7–2.4)

## 2022-10-20 LAB — RESP PANEL BY RT-PCR (FLU A&B, COVID) ARPGX2
Influenza A by PCR: NEGATIVE
Influenza B by PCR: NEGATIVE
SARS Coronavirus 2 by RT PCR: NEGATIVE

## 2022-10-20 LAB — TROPONIN I (HIGH SENSITIVITY)
Troponin I (High Sensitivity): 6 ng/L (ref <18)
Troponin I (High Sensitivity): 6 ng/L (ref <18)

## 2022-10-20 LAB — BRAIN NATRIURETIC PEPTIDE: B Natriuretic Peptide: 1300.4 pg/mL — ABNORMAL HIGH (ref 0.0–100.0)

## 2022-10-20 MED ORDER — METOPROLOL TARTRATE 5 MG/5ML IV SOLN
5.0000 mg | INTRAVENOUS | Status: DC | PRN
  Administered 2022-10-20: 5 mg via INTRAVENOUS
  Filled 2022-10-20: qty 5

## 2022-10-20 MED ORDER — FUROSEMIDE 10 MG/ML IJ SOLN
40.0000 mg | Freq: Once | INTRAMUSCULAR | Status: AC
  Administered 2022-10-20: 40 mg via INTRAVENOUS
  Filled 2022-10-20: qty 4

## 2022-10-20 MED ORDER — METOPROLOL TARTRATE 5 MG/5ML IV SOLN: 5.0000 mg | Freq: Once | INTRAVENOUS | Status: DC

## 2022-10-20 MED ORDER — AMIODARONE HCL IN DEXTROSE 360-4.14 MG/200ML-% IV SOLN
60.0000 mg/h | INTRAVENOUS | Status: AC
  Administered 2022-10-20: 60 mg/h via INTRAVENOUS
  Filled 2022-10-20 (×2): qty 200

## 2022-10-20 MED ORDER — SODIUM CHLORIDE 0.9 % IV SOLN: INTRAVENOUS | Status: DC | PRN

## 2022-10-20 MED ORDER — AMIODARONE HCL IN DEXTROSE 360-4.14 MG/200ML-% IV SOLN
30.0000 mg/h | INTRAVENOUS | Status: DC
  Administered 2022-10-20 – 2022-10-21 (×2): 30 mg/h via INTRAVENOUS

## 2022-10-20 MED ORDER — LORAZEPAM 2 MG/ML IJ SOLN
1.0000 mg | Freq: Once | INTRAMUSCULAR | Status: AC
  Administered 2022-10-20: 1 mg via INTRAVENOUS
  Filled 2022-10-20: qty 1

## 2022-10-20 MED ORDER — POTASSIUM CHLORIDE 10 MEQ/100ML IV SOLN
10.0000 meq | INTRAVENOUS | Status: AC
  Administered 2022-10-20 (×3): 10 meq via INTRAVENOUS
  Filled 2022-10-20 (×3): qty 100

## 2022-10-20 MED ORDER — MAGNESIUM SULFATE 2 GM/50ML IV SOLN
2.0000 g | Freq: Once | INTRAVENOUS | Status: AC
  Administered 2022-10-20: 2 g via INTRAVENOUS
  Filled 2022-10-20: qty 50

## 2022-10-20 MED ORDER — IOHEXOL 350 MG/ML SOLN
75.0000 mL | Freq: Once | INTRAVENOUS | Status: AC | PRN
  Administered 2022-10-20: 75 mL via INTRAVENOUS

## 2022-10-20 MED ORDER — METOPROLOL TARTRATE 5 MG/5ML IV SOLN
2.5000 mg | Freq: Once | INTRAVENOUS | Status: AC
  Administered 2022-10-20: 2.5 mg via INTRAVENOUS
  Filled 2022-10-20: qty 5

## 2022-10-20 MED ORDER — AMIODARONE LOAD VIA INFUSION
150.0000 mg | Freq: Once | INTRAVENOUS | Status: AC
  Administered 2022-10-20: 150 mg via INTRAVENOUS
  Filled 2022-10-20: qty 83.34

## 2022-10-20 MED ORDER — POTASSIUM CHLORIDE CRYS ER 20 MEQ PO TBCR
40.0000 meq | EXTENDED_RELEASE_TABLET | Freq: Once | ORAL | Status: AC
  Administered 2022-10-20: 40 meq via ORAL
  Filled 2022-10-20: qty 2

## 2022-10-20 NOTE — H&P (Signed)
History and Physical    Brendan Ellis HER:[id number] DOB: [birth year] DOA: 10/20/2022  PCP: Robert Bellow, PA-C  Patient coming from: Home  I have personally briefly reviewed patient's old medical records in [place]  Chief Complaint: Short of breath  HPI: Brendan Ellis is a 66 y.o. male with medical history significant for HFrEF (last EF 35% 05/2019) s/p AICD, paroxysmal atrial fibrillation/flutter on Eliquis, AML in remission, T2DM who presented to the ED for evaluation of shortness of breath.  Patient reports 1 week of progressive shortness of breath, cough productive of yellow sputum, orthopnea.  He denies any fevers, chills, diaphoresis, nausea, vomiting, chest pain.  Patient seen on arrival after transfer from [place].  Wearing 15 L HFNC with increased work of breathing and feeling anxious.  Subsequently placed on BiPAP with improved respiratory status.  Currently lives in [place].  Recently married 2 weeks ago.  [facility] ED Course  Labs/Imaging on admission: I have personally reviewed following labs and imaging studies.  Initial vitals showed BP 137/98, pulse 151, RR 28, temp 97.6 F, SpO2 initially 98% on 2 L O2 via [place].  Patient became hypoxic to 84% and was subsequently transitioned to 15 L HFNC with SpO2 98%.  Labs show sodium 138, potassium 2.8, magnesium 1.4, bicarb 24, BUN 23, creatinine 0.64, serum glucose 177, AST 53, ALT 59, alk phos 32, total bilirubin 1.6, WBC 10.6, hemoglobin 13.1, platelets 236,000, BNP 1300.4.  Troponin 6 x 2.  SARS-CoV-2 and influenza PCR negative.  CTA chest negative for evidence of PE.  Heterogeneous consolidative airspace opacity of dependent bilateral lung bases seen.  Pulmonary edema throughout the lungs and small, chronic loculated pleural effusions noted.  Newly enlarged mediastinal lymph nodes seen, likely reactive per radiology read.  Gross cardiomegaly and CAD noted.  Patient was given IV Lasix 40 mg x  2, IV magnesium 2 g, IV K 10 mEq x 3 and 40 mEq orally, IV amiodarone bolus followed by continuous infusion.  EDP discussed with on-call cardiology, Dr. Terrence Dupont, who recommended medical admission and to consult on transfer.  The hospitalist service was consulted to admit for further evaluation and management.  Review of Systems: All systems reviewed and are negative except as documented in history of present illness above.   Past Medical History:  Diagnosis Date   Hypertension    Leukemia ([place])    PAF (paroxysmal atrial fibrillation) ([place]) 04/27/2018    History reviewed. No pertinent surgical history.  Social History:  reports that he has never smoked. He has never used smokeless tobacco. He reports that he does not drink alcohol and does not use drugs.  No Known Allergies  Family History  Problem Relation Age of Onset   Hypertension Mother    Diabetes Mother      Prior to Admission medications   Medication Sig Start Date End Date Taking? Authorizing Provider  aspirin 81 MG chewable tablet Chew 1 tablet (81 mg total) by mouth daily. 04/30/18   Eugenie Filler, MD  blood glucose meter kit and supplies KIT Dispense based on patient and insurance preference. Use up to four times daily as directed. (FOR ICD-9 250.00, 250.01). 12/21/20   Oswald Hillock, MD  carvedilol (COREG) 25 MG tablet Take 25 mg by mouth 2 (two) times daily with a meal.    [provider]  cholecalciferol (VITAMIN D3) 25 MCG (1000 UNIT) tablet Take 1,000 Units by mouth daily.    [provider]  furosemide (LASIX) 20 MG tablet Take 20 mg by mouth daily.    [provider]  insulin aspart (NOVOLOG FLEXPEN) 100 UNIT/ML FlexPen Sliding scale insulin Less than 70 initiate hypoglycemia protocol 70-120  0 units 120-150 2unit 151-200 3 units 201-250 5units 251-300 8 units 301-350 11 units 351-400 15 units Greater than 400 call MD 12/21/20   Oswald Hillock, MD  insulin glargine (LANTUS SOLOSTAR) 100  UNIT/ML Solostar Pen Inject 15 Units into the skin 2 (two) times daily. 12/21/20   Oswald Hillock, MD  Insulin Pen Needle 32G X 4 MM MISC Use as directed 12/21/20   Oswald Hillock, MD    Physical Exam: Vitals:   10/21/22 0000 10/21/22 0018 10/21/22 0030 10/21/22 0145  BP: 133/89  114/82   Pulse:   (!) 101 (!) 122  Resp:    (!) 32  Temp:      TempSrc:      SpO2: 99% 100% 100% 96%  Weight:      Height:       Constitutional: Anxious, speaking in clear sentences Eyes: EOMI, lids and conjunctivae normal ENMT: Mucous membranes are moist. Posterior pharynx clear of any exudate or lesions.Normal dentition.  Neck: normal, supple, no masses. Respiratory: Bibasilar inspiratory crackles.  Increased respiratory effort while on 15 L HFNC. No accessory muscle use.  Cardiovascular: Tachycardic, irregularly irregular, no murmurs / rubs / gallops. No extremity edema. 2+ pedal pulses. Abdomen: no tenderness, no masses palpated.  Musculoskeletal: no clubbing / cyanosis. No joint deformity upper and lower extremities. Good ROM, no contractures. Normal muscle tone.  Skin: no rashes, lesions, ulcers. No induration Neurologic: Sensation intact. Strength 5/5 in all 4.  Psychiatric: Alert and oriented x 3.  Somewhat anxious mood.   EKG: Personally reviewed. A-fib with RVR, rate 142, LVH, secondary repolarization abnormality.  Rate is faster when compared to previous from 2019.  Assessment/Plan Principal Problem:   Atrial fibrillation with rapid ventricular response (HCC) Active Problems:   Acute respiratory failure with hypoxia (HCC)   Acute on chronic HFrEF (heart failure with reduced ejection fraction) (HCC)   Hypokalemia   Hypomagnesemia   AML (acute myeloid leukemia) in remission [facility])   Brendan Ellis is a 66 y.o. male with medical history significant for HFrEF (last EF 35% 05/2019) s/p AICD, paroxysmal atrial fibrillation/flutter on Eliquis, AML in remission, T2DM who is admitted with acute hypoxic  respiratory failure due to acute on chronic HFrEF and A-fib with RVR.  Assessment and Plan: * Atrial fibrillation with rapid ventricular response (HCC) Presenting with A-fib RVR rates 130-140s.  Does not appear to be on any rate/rhythm controlling medications as an outpatient.  Recently started on Eliquis. -Continue IV amiodarone infusion -IV metoprolol as needed given with some improvement in heart rate -Obtain echocardiogram -Cardiology, Dr. Terrence Dupont, to see -Continue Eliquis  Acute respiratory failure with hypoxia ([place]) Arrived on 15 L HFNC from [facility] ED in respiratory distress.  Placed on BiPAP with improvement in respiratory status.  Repeat CBC shows WBC up to 19.2.  Question superimposed pneumonia in addition to pulmonary edema. -Continue IV Lasix 60 mg twice daily -Start on empiric IV ceftriaxone and azithromycin -Obtain blood cultures -ABG and repeat CXR in a.m. -Patient removed BiPAP, continues to have increased work of breathing -Appreciate assistance of PCCM who have evaluated and transferring patient to ICU  Acute on chronic HFrEF (heart failure with reduced ejection fraction) ([place]) Last EF 35% in July 2020.  Has AICD in place.  Not currently  on any guideline-directed medical therapy.  Pulmonary edema noted throughout the lungs. -Continue IV Lasix 60 mg twice daily -Obtain echocardiogram -Further GDMT pending TTE results -Cardiology, Dr. Terrence Dupont, to consult  Hypomagnesemia IV supplement given in ED.  Repeat labs in AM.  Hypokalemia Oral and IV supplement given in ED.  Repeat labs in AM.  DVT prophylaxis:  apixaban (ELIQUIS) tablet 5 mg   Code Status: Full code, confirmed with patient on admission Family Communication: Spouse, daughter, son at bedside Disposition Plan: From home, dispo pending clinical progress Consults called: Cardiology Severity of Illness: The appropriate patient status for this patient is INPATIENT. Inpatient status is judged to be reasonable and  necessary in order to provide the required intensity of service to ensure the patient's safety. The patient's presenting symptoms, physical exam findings, and initial radiographic and laboratory data in the context of their chronic comorbidities is felt to place them at high risk for further clinical deterioration. Furthermore, it is not anticipated that the patient will be medically stable for discharge from the hospital within 2 midnights of admission.   * I certify that at the point of admission it is my clinical judgment that the patient will require inpatient hospital care spanning beyond 2 midnights from the point of admission due to high intensity of service, high risk for further deterioration and high frequency of surveillance required.Zada Finders MD [facility]  If 7PM-7AM, please contact night-coverage [email or web address]  10/21/2022, 1:59 AM

## 2022-10-20 NOTE — ED Notes (Signed)
Increased HFNC to 15L 100% FIO2. Oxygen saturation 98%. Patient tolerating well.

## 2022-10-20 NOTE — ED Provider Notes (Signed)
[facility] EMERGENCY DEPARTMENT Provider Note   CSN: [id number] Arrival date & time: 10/20/22  1652     History  Chief Complaint  Patient presents with   Shortness of Breath    Brendan Ellis is a 66 y.o. male history of A-fib on Eliquis, here presenting with shortness of breath and palpitations.  Patient now lives in [place] and recently saw cardiology in [place].  He is only on Eliquis and has been trying natural supplements to control his A-fib.  He states that he has been having shortness of breath for the last week or so.  Patient states that he also has worsening shortness of breath when he lays flat.  He has EF of 30% after chemotherapy.  Patient came in and was noted to be in rapid A-fib.  The history is provided by the patient.       Home Medications Prior to Admission medications   Medication Sig Start Date End Date Taking? Authorizing Provider  aspirin 81 MG chewable tablet Chew 1 tablet (81 mg total) by mouth daily. 04/30/18   Eugenie Filler, MD  blood glucose meter kit and supplies KIT Dispense based on patient and insurance preference. Use up to four times daily as directed. (FOR ICD-9 250.00, 250.01). 12/21/20   Oswald Hillock, MD  carvedilol (COREG) 25 MG tablet Take 25 mg by mouth 2 (two) times daily with a meal.    [provider]  cholecalciferol (VITAMIN D3) 25 MCG (1000 UNIT) tablet Take 1,000 Units by mouth daily.    [provider]  furosemide (LASIX) 20 MG tablet Take 20 mg by mouth daily.    [provider]  insulin aspart (NOVOLOG FLEXPEN) 100 UNIT/ML FlexPen Sliding scale insulin Less than 70 initiate hypoglycemia protocol 70-120  0 units 120-150 2unit 151-200 3 units 201-250 5units 251-300 8 units 301-350 11 units 351-400 15 units Greater than 400 call MD 12/21/20   Oswald Hillock, MD  insulin glargine (LANTUS SOLOSTAR) 100 UNIT/ML Solostar Pen Inject 15 Units into the skin 2 (two) times daily. 12/21/20   Oswald Hillock, MD   Insulin Pen Needle 32G X 4 MM MISC Use as directed 12/21/20   Oswald Hillock, MD      Allergies    Patient has no known allergies.    Review of Systems   Review of Systems  Respiratory:  Positive for shortness of breath.   All other systems reviewed and are negative.   Physical Exam Updated Vital Signs BP (!) 111/98   Pulse (!) 132   Resp (!) 25   SpO2 97%  Physical Exam Vitals and nursing note reviewed.  Constitutional:      Comments: Chronically ill  HENT:     Head: Normocephalic.     Mouth/Throat:     Mouth: Mucous membranes are moist.  Eyes:     Extraocular Movements: Extraocular movements intact.     Pupils: Pupils are equal, round, and reactive to light.  Cardiovascular:     Rate and Rhythm: Tachycardia present. Rhythm irregular.  Pulmonary:     Comments: Slightly tachypneic and diminished bilateral bases Musculoskeletal:     Cervical back: Normal range of motion and neck supple.     Comments: Trace edema bilaterally  Skin:    General: Skin is warm.     Capillary Refill: Capillary refill takes less than 2 seconds.  Neurological:     General: No focal deficit present.     Mental Status:  He is oriented to person, place, and time.  Psychiatric:        Mood and Affect: Mood normal.        Behavior: Behavior normal.     ED Results / Procedures / Treatments   Labs (all labs ordered are listed, but only abnormal results are displayed) Labs Reviewed  CBC WITH DIFFERENTIAL/PLATELET - Abnormal; Notable for the following components:      Result Value   WBC 10.6 (*)    HCT 37.5 (*)    All other components within normal limits  COMPREHENSIVE METABOLIC PANEL - Abnormal; Notable for the following components:   Potassium 2.8 (*)    Glucose, Bld 177 (*)    Calcium 7.7 (*)    Albumin 3.2 (*)    AST 53 (*)    ALT 59 (*)    Alkaline Phosphatase 32 (*)    Total Bilirubin 1.6 (*)    All other components within normal limits  BRAIN NATRIURETIC PEPTIDE - Abnormal;  Notable for the following components:   B Natriuretic Peptide 1,300.4 (*)    All other components within normal limits  RESP PANEL BY RT-PCR (FLU A&B, COVID) ARPGX2  MAGNESIUM  TROPONIN I (HIGH SENSITIVITY)  TROPONIN I (HIGH SENSITIVITY)    EKG EKG Interpretation  Date/Time:  Thursday October 20 2022 17:04:02 EST Ventricular Rate:  142 PR Interval:    QRS Duration: 105 QT Interval:  304 QTC Calculation: 468 R Axis:   70 Text Interpretation: Atrial fibrillation Probable LVH with secondary repol abnrm rapid afib new since previous Confirmed by Wandra Arthurs [phone]) on 10/20/2022 5:21:36 PM  Radiology DG Chest Port 1 View  Result Date: 10/20/2022 CLINICAL DATA:  Chest pain. EXAM: PORTABLE CHEST 1 VIEW COMPARISON:  Chest radiograph dated May 28, 2018 FINDINGS: The heart is enlarged. Pacemaker leads in the right atrium and right ventricle. Bilateral lower lobe hazy opacities, which may represent bilateral pleural effusions and/or mild edema. No acute osseous abnormality IMPRESSION: Cardiomegaly with bilateral lower lobe hazy opacities, which may represent bilateral pleural effusions and/or mild edema. Electronically Signed   By: Keane Police D.O.   On: 10/20/2022 17:36    Procedures Procedures    CRITICAL CARE Performed by: Wandra Arthurs   Total critical care time: 30 minutes  Critical care time was exclusive of separately billable procedures and treating other patients.  Critical care was necessary to treat or prevent imminent or life-threatening deterioration.  Critical care was time spent personally by me on the following activities: development of treatment plan with patient and/or surrogate as well as nursing, discussions with consultants, evaluation of patient's response to treatment, examination of patient, obtaining history from patient or surrogate, ordering and performing treatments and interventions, ordering and review of laboratory studies, ordering and review of  radiographic studies, pulse oximetry and re-evaluation of patient's condition.   Medications Ordered in ED Medications  amiodarone (NEXTERONE) 1.8 mg/mL load via infusion 150 mg (150 mg Intravenous Bolus from Bag 10/20/22 1732)    Followed by  amiodarone (NEXTERONE PREMIX) 360-4.14 MG/200ML-% (1.8 mg/mL) IV infusion (60 mg/hr Intravenous New Bag/Given 10/20/22 1749)    Followed by  amiodarone (NEXTERONE PREMIX) 360-4.14 MG/200ML-% (1.8 mg/mL) IV infusion (has no administration in time range)  potassium chloride 10 mEq in 100 mL IVPB (has no administration in time range)  potassium chloride SA (KLOR-CON M) CR tablet 40 mEq (has no administration in time range)  iohexol (OMNIPAQUE) 350 MG/ML injection 75 mL (75 mLs Intravenous  Contrast Given 10/20/22 1839)    ED Course/ Medical Decision Making/ A&P                           Medical Decision Making Tyreque Finken is a 66 y.o. male here presenting with shortness of breath and palpitations.  Patient was found to be in rapid A-fib on arrival.  Patient is already on Eliquis but is not on any rate control agents.  Patient does have history of CHF with a EF of 30%.  I am concerned that he may have a CHF exacerbation versus PE versus electrolyte abnormality causing him to be in rapid A-fib.  Plan to get CBC and CMP and BNP and troponin and chest x-ray.  Patient will likely need a CTA chest as well.  Given that his EF is 35%, will start amiodarone and consider beta-blocker or calcium channel blocker.  8:34 PM Patient is still in rapid A-fib with a heart rate of 130s despite being on amiodarone drip.  I reviewed patient's labs and CT scan.  Patient's potassium is 2.8 and magnesium is 1.4.  I ordered multiple rounds of potassium and also IV magnesium.  Patient CT showed possible aspiration but patient has no fever so I held off on antibiotics.  I also discussed case with cardiology, Dr. Terrence Dupont.  He recommend IV metoprolol as needed in addition to amiodarone  drip.  He request that the hospitalist consult him formally when he gets to [facility] if they want a formal consult.   Problems Addressed: Atrial fibrillation with rapid ventricular response ([place]): acute illness or injury Hypokalemia: acute illness or injury Hypomagnesemia: acute illness or injury  Amount and/or Complexity of Data Reviewed Labs: ordered. Decision-making details documented in ED Course. Radiology: ordered and independent interpretation performed. Decision-making details documented in ED Course.  Risk Prescription drug management. Decision regarding hospitalization.    Final Clinical Impression(s) / ED Diagnoses Final diagnoses:  None    Rx / DC Orders ED Discharge Orders     None         Drenda Freeze, MD 10/20/22 2036

## 2022-10-20 NOTE — ED Notes (Signed)
Returned from CT, provided urinal.

## 2022-10-20 NOTE — ED Notes (Signed)
[facility] at bedside to transport pt to [facility].  Pt stable for transport, bedside report given

## 2022-10-20 NOTE — ED Notes (Signed)
Report given to Jerlyn Ly, RN on 6E Pt resting at this time

## 2022-10-20 NOTE — ED Notes (Signed)
Pt with resp rate 28-30, exertional shortness of breath. Oxygen applied 2L [place], RT made aware.

## 2022-10-20 NOTE — Significant Event (Addendum)
Rapid Response Event Note   Reason for Call :  Respiratory distress  Pt just arrived from [facility] in respiratory distress, tachycardic, and requiring 15L HFNC. Pt being admitted for Afib RVR. At [facility] he was given '150mg'$  amiod bolus and started on an amiodarone gtt, 2.'5mg'$  metoprolol, KCL and Mg, and was given '40mg'$  lasix x 2 as well at '1mg'$  Ativan. UOP 340 after '40mg'$  lasix x 2. PCXR-Cardiomegaly with bilateral lower lobe hazy opacities, which may represent bilateral pleural effusions and/or mild edema. CTA chest-neg for PE.  Initial Focused Assessment:  Pt lying in bed with eyes closed. He is alert and oriented, c/o SOB/CP. His breathing is tachypneic and labored. Lungs are clear, with crackles heard in the bases. Skin cool to touch.   T-96.6, HR-135, BP-134/102, RR-26, SpO2-94% on 14L HFNC([place]).  Interventions:  '5mg'$  metoprolol IV q4h PRN Bipap PCCM consulted. Plan of Care:  Give metoprolol, place pt on bipap, and monitor interventions.  Await any additional orders from admitting MD. Continue to monitor pt closely. Call RRT if further  assistance needed.   Event Summary:   MD Notified: Dr. Posey Pronto  Call Time:2256 Arrival Time:2302 End Time:2330  Update: 0000 Dr. Posey Pronto to bedside. HR-80-100s after metoprolol. Pt placed on bipap-breathing still labored, RR-35), SpO2-99% on .50 bipap. Pt initially anxious on bipap but has since began tolerating it better and is able to rest(though still tachypneic and labored). PCCM consulted.   0200-PCCM to bedside. Will tx to ICU.  0235-Pt transferred to 2H14 on 15L HFNC. RT placed pt on HHFNC upon arrival to ICU.  Dillard Essex, RN

## 2022-10-20 NOTE — Progress Notes (Signed)
Plan of Care Note for accepted transfer   Patient: Brendan Ellis MRN: [id number]   [place]: 10/20/2022  Facility requesting transfer: [facility] ED Requesting Provider: Dr. Darl Householder, [place] Reason for transfer: Paroxysmal atrial fibrillation with rapid ventricular response.  Facility course: The patient is a 66 year old male (now lives in [place], [place]) with past medical history significant for HFrEF 20 to 25%, dilated cardiomyopathy, essential hypertension, paroximal atrial fibrillation/A-flutter on Eliquis, leukemia, who presented to [facility] ED with complaints of palpitations of 1 week duration.  No anginal symptoms.  In the ED, heart rate noted to be in the 150s, in A-fib with rapid ventricular response.  He received amiodarone loading dose and was started on amiodarone drip.  Additionally, his lab studies were remarkable for hypokalemia 2.8, magnesium 1.4, BNP greater than 1300.  He was started on IV diuretics, Lasix 40 mg x 1 after repletion of his electrolytes.  EDP discussed with cardiology Dr. Terrence Dupont who recommended IV Lopressor as needed to control the rate.  Consult cardiology once the patient arrives at [facility], for an official consult.   Plan of care: The patient is accepted for admission to Progressive unit, at [facility] as inpatient status.  Author: Kayleen Memos, DO 10/20/2022  Check [email or web address] for on-call coverage.  Nursing staff, Please call [place] number on Amion as soon as patient's arrival, so appropriate admitting provider can evaluate the pt.

## 2022-10-20 NOTE — ED Triage Notes (Signed)
Cough, sore throat, and shob x 1 week. Started abx on Sunday from pcp. Sx unresolved. Also endorses weakness. Denies chest pain. Laying flat makes it harder to breath.

## 2022-10-20 NOTE — ED Notes (Signed)
Patient transported to CT 

## 2022-10-20 NOTE — ED Notes (Signed)
Pt spo2 decreased to 84% on 2 L [place], respiratory and provider notified.  Pt placed on 6L high flow o2, spo2 up to 100%.

## 2022-10-21 ENCOUNTER — Inpatient Hospital Stay (HOSPITAL_COMMUNITY): Payer: Medicare HMO

## 2022-10-21 ENCOUNTER — Other Ambulatory Visit (HOSPITAL_COMMUNITY): Payer: Medicare HMO

## 2022-10-21 ENCOUNTER — Encounter (HOSPITAL_COMMUNITY)
Admission: EM | Disposition: A | Discharge status: Other Acute Inpatient Hospital | Payer: Self-pay | Source: Home / Self Care | Attending: Critical Care Medicine

## 2022-10-21 DIAGNOSIS — J9601 Acute respiratory failure with hypoxia: Secondary | ICD-10-CM

## 2022-10-21 DIAGNOSIS — R570 Cardiogenic shock: Secondary | ICD-10-CM

## 2022-10-21 DIAGNOSIS — I4891 Unspecified atrial fibrillation: Secondary | ICD-10-CM | POA: Diagnosis not present

## 2022-10-21 DIAGNOSIS — G931 Anoxic brain damage, not elsewhere classified: Secondary | ICD-10-CM

## 2022-10-21 DIAGNOSIS — I5021 Acute systolic (congestive) heart failure: Secondary | ICD-10-CM

## 2022-10-21 DIAGNOSIS — J9600 Acute respiratory failure, unspecified whether with hypoxia or hypercapnia: Secondary | ICD-10-CM

## 2022-10-21 DIAGNOSIS — J810 Acute pulmonary edema: Secondary | ICD-10-CM

## 2022-10-21 DIAGNOSIS — I5023 Acute on chronic systolic (congestive) heart failure: Secondary | ICD-10-CM | POA: Diagnosis not present

## 2022-10-21 HISTORY — PX: IABP INSERTION: CATH118242

## 2022-10-21 HISTORY — PX: ARTERIAL LINE INSERTION: CATH118227

## 2022-10-21 HISTORY — PX: RIGHT HEART CATH: CATH118263

## 2022-10-21 LAB — POCT I-STAT EG7
Acid-base deficit: 11 mmol/L — ABNORMAL HIGH (ref 0.0–2.0)
Acid-base deficit: 11 mmol/L — ABNORMAL HIGH (ref 0.0–2.0)
Acid-base deficit: 10 mmol/L — ABNORMAL HIGH (ref 0.0–2.0)
Bicarbonate: 16.2 mmol/L — ABNORMAL LOW (ref 20.0–28.0)
Bicarbonate: 16.4 mmol/L — ABNORMAL LOW (ref 20.0–28.0)
Bicarbonate: 16.8 mmol/L — ABNORMAL LOW (ref 20.0–28.0)
Calcium, Ion: 0.82 mmol/L — CL (ref 1.15–1.40)
Calcium, Ion: 0.82 mmol/L — CL (ref 1.15–1.40)
Calcium, Ion: 0.77 mmol/L — CL (ref 1.15–1.40)
HCT: 38 % — ABNORMAL LOW (ref 39.0–52.0)
HCT: 39 % (ref 39.0–52.0)
HCT: 37 % — ABNORMAL LOW (ref 39.0–52.0)
Hemoglobin: 12.9 g/dL — ABNORMAL LOW (ref 13.0–17.0)
Hemoglobin: 13.3 g/dL (ref 13.0–17.0)
Hemoglobin: 12.6 g/dL — ABNORMAL LOW (ref 13.0–17.0)
O2 Saturation: 68 %
O2 Saturation: 71 %
O2 Saturation: 76 %
Potassium: 3.1 mmol/L — ABNORMAL LOW (ref 3.5–5.1)
Potassium: 3.1 mmol/L — ABNORMAL LOW (ref 3.5–5.1)
Potassium: 3.2 mmol/L — ABNORMAL LOW (ref 3.5–5.1)
Sodium: 144 mmol/L (ref 135–145)
Sodium: 144 mmol/L (ref 135–145)
Sodium: 144 mmol/L (ref 135–145)
TCO2: 17 mmol/L — ABNORMAL LOW (ref 22–32)
TCO2: 18 mmol/L — ABNORMAL LOW (ref 22–32)
TCO2: 18 mmol/L — ABNORMAL LOW (ref 22–32)
pCO2, Ven: 39.9 mmHg — ABNORMAL LOW (ref 44–60)
pCO2, Ven: 40.5 mmHg — ABNORMAL LOW (ref 44–60)
pCO2, Ven: 38.9 mmHg — ABNORMAL LOW (ref 44–60)
pH, Ven: 7.215 — ABNORMAL LOW (ref 7.25–7.43)
pH, Ven: 7.217 — ABNORMAL LOW (ref 7.25–7.43)
pH, Ven: 7.244 — ABNORMAL LOW (ref 7.25–7.43)
pO2, Ven: 42 mmHg (ref 32–45)
pO2, Ven: 44 mmHg (ref 32–45)
pO2, Ven: 47 mmHg — ABNORMAL HIGH (ref 32–45)

## 2022-10-21 LAB — RESPIRATORY PANEL BY PCR

## 2022-10-21 LAB — POCT I-STAT 7, (LYTES, BLD GAS, ICA,H+H)
Acid-Base Excess: 2 mmol/L (ref 0.0–2.0)
Acid-base deficit: 13 mmol/L — ABNORMAL HIGH (ref 0.0–2.0)
Acid-base deficit: 16 mmol/L — ABNORMAL HIGH (ref 0.0–2.0)
Acid-base deficit: 12 mmol/L — ABNORMAL HIGH (ref 0.0–2.0)
Acid-base deficit: 14 mmol/L — ABNORMAL HIGH (ref 0.0–2.0)
Acid-base deficit: 12 mmol/L — ABNORMAL HIGH (ref 0.0–2.0)
Acid-base deficit: 2 mmol/L (ref 0.0–2.0)
Acid-base deficit: 1 mmol/L (ref 0.0–2.0)
Bicarbonate: 12.6 mmol/L — ABNORMAL LOW (ref 20.0–28.0)
Bicarbonate: 11.2 mmol/L — ABNORMAL LOW (ref 20.0–28.0)
Bicarbonate: 11.4 mmol/L — ABNORMAL LOW (ref 20.0–28.0)
Bicarbonate: 15.3 mmol/L — ABNORMAL LOW (ref 20.0–28.0)
Bicarbonate: 12.9 mmol/L — ABNORMAL LOW (ref 20.0–28.0)
Bicarbonate: 21.8 mmol/L (ref 20.0–28.0)
Bicarbonate: 22.6 mmol/L (ref 20.0–28.0)
Bicarbonate: 24.5 mmol/L (ref 20.0–28.0)
Calcium, Ion: 0.85 mmol/L — CL (ref 1.15–1.40)
Calcium, Ion: 0.85 mmol/L — CL (ref 1.15–1.40)
Calcium, Ion: 0.79 mmol/L — CL (ref 1.15–1.40)
Calcium, Ion: 0.81 mmol/L — CL (ref 1.15–1.40)
Calcium, Ion: 0.69 mmol/L — CL (ref 1.15–1.40)
Calcium, Ion: 0.82 mmol/L — CL (ref 1.15–1.40)
Calcium, Ion: 0.82 mmol/L — CL (ref 1.15–1.40)
Calcium, Ion: 0.85 mmol/L — CL (ref 1.15–1.40)
HCT: 39 % (ref 39.0–52.0)
HCT: 39 % (ref 39.0–52.0)
HCT: 36 % — ABNORMAL LOW (ref 39.0–52.0)
HCT: 39 % (ref 39.0–52.0)
HCT: 35 % — ABNORMAL LOW (ref 39.0–52.0)
HCT: 37 % — ABNORMAL LOW (ref 39.0–52.0)
HCT: 36 % — ABNORMAL LOW (ref 39.0–52.0)
HCT: 33 % — ABNORMAL LOW (ref 39.0–52.0)
Hemoglobin: 13.3 g/dL (ref 13.0–17.0)
Hemoglobin: 13.3 g/dL (ref 13.0–17.0)
Hemoglobin: 12.2 g/dL — ABNORMAL LOW (ref 13.0–17.0)
Hemoglobin: 13.3 g/dL (ref 13.0–17.0)
Hemoglobin: 11.9 g/dL — ABNORMAL LOW (ref 13.0–17.0)
Hemoglobin: 12.6 g/dL — ABNORMAL LOW (ref 13.0–17.0)
Hemoglobin: 12.2 g/dL — ABNORMAL LOW (ref 13.0–17.0)
Hemoglobin: 11.2 g/dL — ABNORMAL LOW (ref 13.0–17.0)
O2 Saturation: 98 %
O2 Saturation: 100 %
O2 Saturation: 78 %
O2 Saturation: 99 %
O2 Saturation: 99 %
O2 Saturation: 100 %
O2 Saturation: 100 %
O2 Saturation: 100 %
Patient temperature: 96
Patient temperature: 36.3
Patient temperature: 36.5
Patient temperature: 36.6
Potassium: 4.5 mmol/L (ref 3.5–5.1)
Potassium: 4 mmol/L (ref 3.5–5.1)
Potassium: 2.9 mmol/L — ABNORMAL LOW (ref 3.5–5.1)
Potassium: 3.7 mmol/L (ref 3.5–5.1)
Potassium: 2.9 mmol/L — ABNORMAL LOW (ref 3.5–5.1)
Potassium: 3.5 mmol/L (ref 3.5–5.1)
Potassium: 3.2 mmol/L — ABNORMAL LOW (ref 3.5–5.1)
Potassium: 3.1 mmol/L — ABNORMAL LOW (ref 3.5–5.1)
Sodium: 142 mmol/L (ref 135–145)
Sodium: 141 mmol/L (ref 135–145)
Sodium: 143 mmol/L (ref 135–145)
Sodium: 145 mmol/L (ref 135–145)
Sodium: 147 mmol/L — ABNORMAL HIGH (ref 135–145)
Sodium: 141 mmol/L (ref 135–145)
Sodium: 140 mmol/L (ref 135–145)
Sodium: 140 mmol/L (ref 135–145)
TCO2: 14 mmol/L — ABNORMAL LOW (ref 22–32)
TCO2: 12 mmol/L — ABNORMAL LOW (ref 22–32)
TCO2: 12 mmol/L — ABNORMAL LOW (ref 22–32)
TCO2: 17 mmol/L — ABNORMAL LOW (ref 22–32)
TCO2: 14 mmol/L — ABNORMAL LOW (ref 22–32)
TCO2: 23 mmol/L (ref 22–32)
TCO2: 24 mmol/L (ref 22–32)
TCO2: 25 mmol/L (ref 22–32)
pCO2 arterial: 27.7 mmHg — ABNORMAL LOW (ref 32–48)
pCO2 arterial: 30.5 mmHg — ABNORMAL LOW (ref 32–48)
pCO2 arterial: 25.6 mmHg — ABNORMAL LOW (ref 32–48)
pCO2 arterial: 40.3 mmHg (ref 32–48)
pCO2 arterial: 27.4 mmHg — ABNORMAL LOW (ref 32–48)
pCO2 arterial: 31.8 mmHg — ABNORMAL LOW (ref 32–48)
pCO2 arterial: 31.3 mmHg — ABNORMAL LOW (ref 32–48)
pCO2 arterial: 30 mmHg — ABNORMAL LOW (ref 32–48)
pH, Arterial: 7.259 — ABNORMAL LOW (ref 7.35–7.45)
pH, Arterial: 7.175 — CL (ref 7.35–7.45)
pH, Arterial: 7.188 — CL (ref 7.35–7.45)
pH, Arterial: 7.257 — ABNORMAL LOW (ref 7.35–7.45)
pH, Arterial: 7.282 — ABNORMAL LOW (ref 7.35–7.45)
pH, Arterial: 7.441 (ref 7.35–7.45)
pH, Arterial: 7.464 — ABNORMAL HIGH (ref 7.35–7.45)
pH, Arterial: 7.519 — ABNORMAL HIGH (ref 7.35–7.45)
pO2, Arterial: 103 mmHg (ref 83–108)
pO2, Arterial: 307 mmHg — ABNORMAL HIGH (ref 83–108)
pO2, Arterial: 146 mmHg — ABNORMAL HIGH (ref 83–108)
pO2, Arterial: 53 mmHg — ABNORMAL LOW (ref 83–108)
pO2, Arterial: 145 mmHg — ABNORMAL HIGH (ref 83–108)
pO2, Arterial: 360 mmHg — ABNORMAL HIGH (ref 83–108)
pO2, Arterial: 327 mmHg — ABNORMAL HIGH (ref 83–108)
pO2, Arterial: 242 mmHg — ABNORMAL HIGH (ref 83–108)

## 2022-10-21 LAB — ECHOCARDIOGRAM COMPLETE
Area-P 1/2: 4.64 cm2
Calc EF: 44.5 %
Height: 74 in
S' Lateral: 5.2 cm
Single Plane A2C EF: 49.4 %
Single Plane A4C EF: 35.6 %
Weight: 3167.57 oz

## 2022-10-21 LAB — URINALYSIS, MICROSCOPIC (REFLEX)

## 2022-10-21 LAB — BASIC METABOLIC PANEL
Anion gap: 21 — ABNORMAL HIGH (ref 5–15)
Anion gap: 33 — ABNORMAL HIGH (ref 5–15)
Anion gap: 26 — ABNORMAL HIGH (ref 5–15)
BUN: 25 mg/dL — ABNORMAL HIGH (ref 8–23)
BUN: 29 mg/dL — ABNORMAL HIGH (ref 8–23)
BUN: 40 mg/dL — ABNORMAL HIGH (ref 8–23)
CO2: 16 mmol/L — ABNORMAL LOW (ref 22–32)
CO2: 12 mmol/L — ABNORMAL LOW (ref 22–32)
CO2: 19 mmol/L — ABNORMAL LOW (ref 22–32)
Calcium: 8.1 mg/dL — ABNORMAL LOW (ref 8.9–10.3)
Calcium: 7.6 mg/dL — ABNORMAL LOW (ref 8.9–10.3)
Calcium: 6.9 mg/dL — ABNORMAL LOW (ref 8.9–10.3)
Chloride: 103 mmol/L (ref 98–111)
Chloride: 101 mmol/L (ref 98–111)
Chloride: 99 mmol/L (ref 98–111)
Creatinine, Ser: 1.77 mg/dL — ABNORMAL HIGH (ref 0.61–1.24)
Creatinine, Ser: 2.44 mg/dL — ABNORMAL HIGH (ref 0.61–1.24)
Creatinine, Ser: 3.35 mg/dL — ABNORMAL HIGH (ref 0.61–1.24)
GFR, Estimated: 42 mL/min — ABNORMAL LOW (ref >60)
GFR, Estimated: 29 mL/min — ABNORMAL LOW (ref >60)
GFR, Estimated: 20 mL/min — ABNORMAL LOW (ref >60)
Glucose, Bld: 209 mg/dL — ABNORMAL HIGH (ref 70–99)
Glucose, Bld: 139 mg/dL — ABNORMAL HIGH (ref 70–99)
Glucose, Bld: 344 mg/dL — ABNORMAL HIGH (ref 70–99)
Potassium: 3.2 mmol/L — ABNORMAL LOW (ref 3.5–5.1)
Potassium: 4.5 mmol/L (ref 3.5–5.1)
Potassium: 3.3 mmol/L — ABNORMAL LOW (ref 3.5–5.1)
Sodium: 140 mmol/L (ref 135–145)
Sodium: 146 mmol/L — ABNORMAL HIGH (ref 135–145)
Sodium: 144 mmol/L (ref 135–145)

## 2022-10-21 LAB — CBC
HCT: 40.7 % (ref 39.0–52.0)
HCT: 39.6 % (ref 39.0–52.0)
Hemoglobin: 14 g/dL (ref 13.0–17.0)
Hemoglobin: 13.4 g/dL (ref 13.0–17.0)
MCH: 30.1 pg (ref 26.0–34.0)
MCH: 30.2 pg (ref 26.0–34.0)
MCHC: 34.4 g/dL (ref 30.0–36.0)
MCHC: 33.8 g/dL (ref 30.0–36.0)
MCV: 87.5 fL (ref 80.0–100.0)
MCV: 89.2 fL (ref 80.0–100.0)
Platelets: 233 10*3/uL (ref 150–400)
Platelets: 193 10*3/uL (ref 150–400)
RBC: 4.65 MIL/uL (ref 4.22–5.81)
RBC: 4.44 MIL/uL (ref 4.22–5.81)
RDW: 15.7 % — ABNORMAL HIGH (ref 11.5–15.5)
RDW: 16 % — ABNORMAL HIGH (ref 11.5–15.5)
WBC: 19.2 10*3/uL — ABNORMAL HIGH (ref 4.0–10.5)
WBC: 19.9 10*3/uL — ABNORMAL HIGH (ref 4.0–10.5)
nRBC: 0.1 % (ref 0.0–0.2)
nRBC: 0.4 % — ABNORMAL HIGH (ref 0.0–0.2)

## 2022-10-21 LAB — URINALYSIS, ROUTINE W REFLEX MICROSCOPIC
Bilirubin Urine: NEGATIVE
Glucose, UA: NEGATIVE mg/dL
Ketones, ur: NEGATIVE mg/dL
Leukocytes,Ua: NEGATIVE
Nitrite: NEGATIVE
Protein, ur: >300 mg/dL — AB
Specific Gravity, Urine: 1.02 (ref 1.005–1.030)
pH: 6 (ref 5.0–8.0)

## 2022-10-21 LAB — GLUCOSE, CAPILLARY
Glucose-Capillary: 173 mg/dL — ABNORMAL HIGH (ref 70–99)
Glucose-Capillary: 130 mg/dL — ABNORMAL HIGH (ref 70–99)
Glucose-Capillary: 245 mg/dL — ABNORMAL HIGH (ref 70–99)
Glucose-Capillary: 337 mg/dL — ABNORMAL HIGH (ref 70–99)
Glucose-Capillary: 330 mg/dL — ABNORMAL HIGH (ref 70–99)

## 2022-10-21 LAB — COOXEMETRY PANEL
Carboxyhemoglobin: 0.3 % — ABNORMAL LOW (ref 0.5–1.5)
Carboxyhemoglobin: 1.8 % — ABNORMAL HIGH (ref 0.5–1.5)
Methemoglobin: 1.3 % (ref 0.0–1.5)
Methemoglobin: <0.7 % (ref 0.0–1.5)
O2 Saturation: 38.6 %
O2 Saturation: 88.5 %
Total hemoglobin: 13.1 g/dL (ref 12.0–16.0)
Total hemoglobin: 12 g/dL (ref 12.0–16.0)

## 2022-10-21 LAB — SEDIMENTATION RATE: Sed Rate: 12 mm/hr (ref 0–16)

## 2022-10-21 LAB — MAGNESIUM
Magnesium: 2.3 mg/dL (ref 1.7–2.4)
Magnesium: 2.6 mg/dL — ABNORMAL HIGH (ref 1.7–2.4)
Magnesium: 1.9 mg/dL (ref 1.7–2.4)

## 2022-10-21 LAB — BASIC METABOLIC PANEL WITH GFR
Anion gap: 36 — ABNORMAL HIGH (ref 5–15)
BUN: 34 mg/dL — ABNORMAL HIGH (ref 8–23)
CO2: 12 mmol/L — ABNORMAL LOW (ref 22–32)
Calcium: 6.9 mg/dL — ABNORMAL LOW (ref 8.9–10.3)
Chloride: 98 mmol/L (ref 98–111)
Creatinine, Ser: 3.05 mg/dL — ABNORMAL HIGH (ref 0.61–1.24)
GFR, Estimated: 22 mL/min — ABNORMAL LOW
Glucose, Bld: 293 mg/dL — ABNORMAL HIGH (ref 70–99)
Potassium: 3.4 mmol/L — ABNORMAL LOW (ref 3.5–5.1)
Sodium: 146 mmol/L — ABNORMAL HIGH (ref 135–145)

## 2022-10-21 LAB — LACTIC ACID, PLASMA
Lactic Acid, Venous: 8.2 mmol/L (ref 0.5–1.9)
Lactic Acid, Venous: >9 mmol/L (ref 0.5–1.9)
Lactic Acid, Venous: >9 mmol/L (ref 0.5–1.9)
Lactic Acid, Venous: >9 mmol/L (ref 0.5–1.9)
Lactic Acid, Venous: >9 mmol/L (ref 0.5–1.9)

## 2022-10-21 LAB — HIV ANTIBODY (ROUTINE TESTING W REFLEX): HIV Screen 4th Generation wRfx: NONREACTIVE

## 2022-10-21 LAB — STREP PNEUMONIAE URINARY ANTIGEN: Strep Pneumo Urinary Antigen: NEGATIVE

## 2022-10-21 LAB — MRSA NEXT GEN BY PCR, NASAL: MRSA by PCR Next Gen: NOT DETECTED

## 2022-10-21 LAB — C-REACTIVE PROTEIN: CRP: 9.6 mg/dL — ABNORMAL HIGH (ref <1.0)

## 2022-10-21 SURGERY — RIGHT HEART CATH
Anesthesia: LOCAL

## 2022-10-21 MED ORDER — SODIUM CHLORIDE 0.9 % IV SOLN
INTRAVENOUS | Status: AC | PRN
  Administered 2022-10-21: 10 mL/h via INTRAVENOUS

## 2022-10-21 MED ORDER — VASOPRESSIN 20 UNITS/100 ML INFUSION FOR SHOCK
0.0000 [IU]/min | INTRAVENOUS | Status: DC
  Administered 2022-10-21 – 2022-10-22 (×3): 0.03 [IU]/min via INTRAVENOUS
  Administered 2022-10-23: 0.02 [IU]/min via INTRAVENOUS
  Administered 2022-10-23: 0.03 [IU]/min via INTRAVENOUS
  Filled 2022-10-21 (×7): qty 100

## 2022-10-21 MED ORDER — HYDROCORTISONE SOD SUC (PF) 100 MG IJ SOLR
100.0000 mg | Freq: Two times a day (BID) | INTRAMUSCULAR | Status: DC
  Administered 2022-10-21 – 2022-10-26 (×10): 100 mg via INTRAVENOUS
  Filled 2022-10-21 (×10): qty 2

## 2022-10-21 MED ORDER — PHENYLEPHRINE 80 MCG/ML (10ML) SYRINGE FOR IV PUSH (FOR BLOOD PRESSURE SUPPORT)
PREFILLED_SYRINGE | INTRAVENOUS | Status: AC
  Filled 2022-10-21: qty 10

## 2022-10-21 MED ORDER — DOBUTAMINE IN D5W 4-5 MG/ML-% IV SOLN: 2.5000 ug/kg/min | INTRAVENOUS | Status: DC

## 2022-10-21 MED ORDER — SODIUM CHLORIDE 0.9 % IV SOLN: 2.0000 g | INTRAVENOUS | Status: DC

## 2022-10-21 MED ORDER — POTASSIUM CHLORIDE 20 MEQ PO PACK: 20.0000 meq | PACK | Freq: Two times a day (BID) | ORAL | Status: DC

## 2022-10-21 MED ORDER — DOCUSATE SODIUM 50 MG/5ML PO LIQD
100.0000 mg | Freq: Two times a day (BID) | ORAL | Status: DC
  Administered 2022-10-21 – 2022-10-27 (×6): 100 mg
  Filled 2022-10-21 (×7): qty 10

## 2022-10-21 MED ORDER — LORAZEPAM 2 MG/ML IJ SOLN
INTRAMUSCULAR | Status: AC
  Administered 2022-10-21: 2 mg via INTRAVENOUS
  Filled 2022-10-21: qty 1

## 2022-10-21 MED ORDER — ALBUMIN HUMAN 5 % IV SOLN
INTRAVENOUS | Status: AC
  Filled 2022-10-21: qty 250

## 2022-10-21 MED ORDER — LIDOCAINE HCL (PF) 1 % IJ SOLN
INTRAMUSCULAR | Status: DC | PRN
  Administered 2022-10-21: 15 mL
  Administered 2022-10-21: 2 mL
  Administered 2022-10-21: 5 mL

## 2022-10-21 MED ORDER — ACETAMINOPHEN 325 MG PO TABS: 650.0000 mg | ORAL_TABLET | ORAL | Status: DC | PRN

## 2022-10-21 MED ORDER — PIPERACILLIN-TAZOBACTAM 3.375 G IVPB
3.3750 g | Freq: Three times a day (TID) | INTRAVENOUS | Status: DC
  Administered 2022-10-21 – 2022-10-22 (×4): 3.375 g via INTRAVENOUS
  Filled 2022-10-21 (×4): qty 50

## 2022-10-21 MED ORDER — SENNOSIDES-DOCUSATE SODIUM 8.6-50 MG PO TABS: 1.0000 | ORAL_TABLET | Freq: Every evening | ORAL | Status: DC | PRN

## 2022-10-21 MED ORDER — MILRINONE LACTATE IN DEXTROSE 20-5 MG/100ML-% IV SOLN
INTRAVENOUS | Status: AC
  Filled 2022-10-21: qty 100

## 2022-10-21 MED ORDER — ORAL CARE MOUTH RINSE
15.0000 mL | OROMUCOSAL | Status: DC
  Administered 2022-10-21 – 2022-10-28 (×83): 15 mL via OROMUCOSAL

## 2022-10-21 MED ORDER — PROPOFOL 1000 MG/100ML IV EMUL
5.0000 ug/kg/min | INTRAVENOUS | Status: DC
  Administered 2022-10-21: 35 ug/kg/min via INTRAVENOUS
  Administered 2022-10-22: 30 ug/kg/min via INTRAVENOUS
  Administered 2022-10-22: 45 ug/kg/min via INTRAVENOUS
  Administered 2022-10-22: 30 ug/kg/min via INTRAVENOUS
  Administered 2022-10-22: 40 ug/kg/min via INTRAVENOUS
  Administered 2022-10-22 – 2022-10-23 (×2): 30 ug/kg/min via INTRAVENOUS
  Administered 2022-10-23: 20 ug/kg/min via INTRAVENOUS
  Administered 2022-10-23 (×2): 30 ug/kg/min via INTRAVENOUS
  Administered 2022-10-24: 20 ug/kg/min via INTRAVENOUS
  Administered 2022-10-24: 15 ug/kg/min via INTRAVENOUS
  Administered 2022-10-24: 20 ug/kg/min via INTRAVENOUS
  Administered 2022-10-25: 10 ug/kg/min via INTRAVENOUS
  Administered 2022-10-25: 40 ug/kg/min via INTRAVENOUS
  Administered 2022-10-25: 30 ug/kg/min via INTRAVENOUS
  Administered 2022-10-25: 40 ug/kg/min via INTRAVENOUS
  Filled 2022-10-21 (×10): qty 100
  Filled 2022-10-21: qty 200
  Filled 2022-10-21 (×6): qty 100

## 2022-10-21 MED ORDER — LEVETIRACETAM IN NACL 500 MG/100ML IV SOLN
500.0000 mg | Freq: Two times a day (BID) | INTRAVENOUS | Status: DC
  Administered 2022-10-22 – 2022-10-23 (×3): 500 mg via INTRAVENOUS
  Filled 2022-10-21 (×3): qty 100

## 2022-10-21 MED ORDER — CALCIUM GLUCONATE-NACL 1-0.675 GM/50ML-% IV SOLN
1.0000 g | Freq: Once | INTRAVENOUS | Status: AC
  Administered 2022-10-21: 1000 mg via INTRAVENOUS
  Filled 2022-10-21: qty 50

## 2022-10-21 MED ORDER — ACETAMINOPHEN 650 MG RE SUPP: 650.0000 mg | Freq: Four times a day (QID) | RECTAL | Status: DC | PRN

## 2022-10-21 MED ORDER — SODIUM CHLORIDE 0.9 % IV SOLN
INTRAVENOUS | Status: DC
  Administered 2022-10-24: 10 mL/h via INTRAVENOUS

## 2022-10-21 MED ORDER — SODIUM CHLORIDE 0.9 % IV SOLN: INTRAVENOUS | Status: DC

## 2022-10-21 MED ORDER — SODIUM CHLORIDE 0.9% IV SOLUTION: INTRAVENOUS | Status: DC

## 2022-10-21 MED ORDER — EPINEPHRINE HCL 5 MG/250ML IV SOLN IN NS
0.5000 ug/min | INTRAVENOUS | Status: DC
  Administered 2022-10-21: 25 ug/min via INTRAVENOUS
  Filled 2022-10-21: qty 250

## 2022-10-21 MED ORDER — SUCCINYLCHOLINE CHLORIDE 200 MG/10ML IV SOSY
PREFILLED_SYRINGE | INTRAVENOUS | Status: AC
  Filled 2022-10-21: qty 10

## 2022-10-21 MED ORDER — ONDANSETRON HCL 4 MG/2ML IJ SOLN: 4.0000 mg | Freq: Four times a day (QID) | INTRAMUSCULAR | Status: DC | PRN

## 2022-10-21 MED ORDER — POTASSIUM CHLORIDE 10 MEQ/50ML IV SOLN
10.0000 meq | INTRAVENOUS | Status: AC
  Administered 2022-10-21 (×2): 10 meq via INTRAVENOUS
  Filled 2022-10-21 (×2): qty 50

## 2022-10-21 MED ORDER — HEPARIN (PORCINE) 25000 UT/250ML-% IV SOLN
1400.0000 [IU]/h | INTRAVENOUS | Status: DC
  Administered 2022-10-21: 1000 [IU]/h via INTRAVENOUS
  Administered 2022-10-22 – 2022-10-24 (×4): 1400 [IU]/h via INTRAVENOUS
  Filled 2022-10-21 (×6): qty 250

## 2022-10-21 MED ORDER — KETAMINE HCL 50 MG/5ML IJ SOSY
PREFILLED_SYRINGE | INTRAMUSCULAR | Status: AC
  Filled 2022-10-21: qty 10

## 2022-10-21 MED ORDER — SODIUM BICARBONATE 8.4 % IV SOLN
INTRAVENOUS | Status: AC
  Administered 2022-10-21: 50 meq via INTRAVENOUS
  Filled 2022-10-21: qty 100

## 2022-10-21 MED ORDER — ETOMIDATE 2 MG/ML IV SOLN
INTRAVENOUS | Status: AC
  Administered 2022-10-21: 30 mg
  Filled 2022-10-21: qty 20

## 2022-10-21 MED ORDER — POLYETHYLENE GLYCOL 3350 17 G PO PACK
17.0000 g | PACK | Freq: Every day | ORAL | Status: DC
  Administered 2022-10-24 – 2022-10-25 (×2): 17 g
  Filled 2022-10-21 (×2): qty 1

## 2022-10-21 MED ORDER — SODIUM BICARBONATE 8.4 % IV SOLN
INTRAVENOUS | Status: AC
  Filled 2022-10-21: qty 100

## 2022-10-21 MED ORDER — AMIODARONE HCL IN DEXTROSE 360-4.14 MG/200ML-% IV SOLN
60.0000 mg/h | INTRAVENOUS | Status: AC
  Administered 2022-10-21 (×2): 60 mg/h via INTRAVENOUS

## 2022-10-21 MED ORDER — SODIUM CHLORIDE 0.9 % IV SOLN: 250.0000 mL | INTRAVENOUS | Status: DC | PRN

## 2022-10-21 MED ORDER — FENTANYL CITRATE PF 50 MCG/ML IJ SOSY
50.0000 ug | PREFILLED_SYRINGE | INTRAMUSCULAR | Status: DC | PRN
  Administered 2022-10-26 – 2022-10-28 (×4): 50 ug via INTRAVENOUS
  Filled 2022-10-21: qty 2
  Filled 2022-10-21: qty 1
  Filled 2022-10-21: qty 2
  Filled 2022-10-21: qty 1

## 2022-10-21 MED ORDER — INSULIN ASPART 100 UNIT/ML IJ SOLN
0.0000 [IU] | INTRAMUSCULAR | Status: DC
  Administered 2022-10-21: 11 [IU] via SUBCUTANEOUS
  Administered 2022-10-21: 3 [IU] via SUBCUTANEOUS
  Administered 2022-10-21: 5 [IU] via SUBCUTANEOUS
  Administered 2022-10-21: 11 [IU] via SUBCUTANEOUS
  Administered 2022-10-22: 2 [IU] via SUBCUTANEOUS
  Administered 2022-10-22: 11 [IU] via SUBCUTANEOUS
  Administered 2022-10-22: 5 [IU] via SUBCUTANEOUS
  Administered 2022-10-22 – 2022-10-23 (×2): 3 [IU] via SUBCUTANEOUS
  Administered 2022-10-23 (×4): 2 [IU] via SUBCUTANEOUS
  Administered 2022-10-23: 3 [IU] via SUBCUTANEOUS
  Administered 2022-10-24 (×3): 2 [IU] via SUBCUTANEOUS
  Administered 2022-10-25 (×3): 3 [IU] via SUBCUTANEOUS
  Administered 2022-10-25: 2 [IU] via SUBCUTANEOUS
  Administered 2022-10-25: 5 [IU] via SUBCUTANEOUS
  Administered 2022-10-25: 3 [IU] via SUBCUTANEOUS
  Administered 2022-10-25: 2 [IU] via SUBCUTANEOUS
  Administered 2022-10-26: 3 [IU] via SUBCUTANEOUS
  Administered 2022-10-26 (×3): 2 [IU] via SUBCUTANEOUS
  Administered 2022-10-26 – 2022-10-28 (×9): 3 [IU] via SUBCUTANEOUS
  Administered 2022-10-28: 2 [IU] via SUBCUTANEOUS
  Administered 2022-10-28: 3 [IU] via SUBCUTANEOUS
  Administered 2022-10-28: 2 [IU] via SUBCUTANEOUS
  Administered 2022-10-28: 3 [IU] via SUBCUTANEOUS

## 2022-10-21 MED ORDER — NOREPINEPHRINE 4 MG/250ML-% IV SOLN
INTRAVENOUS | Status: AC
  Filled 2022-10-21: qty 250

## 2022-10-21 MED ORDER — HYDRALAZINE HCL 20 MG/ML IJ SOLN: 10.0000 mg | INTRAMUSCULAR | Status: AC | PRN

## 2022-10-21 MED ORDER — LORAZEPAM 2 MG/ML IJ SOLN
2.0000 mg | INTRAMUSCULAR | Status: DC | PRN
  Administered 2022-10-21 – 2022-10-23 (×3): 2 mg via INTRAVENOUS
  Filled 2022-10-21 (×4): qty 1

## 2022-10-21 MED ORDER — PANTOPRAZOLE SODIUM 40 MG IV SOLR
40.0000 mg | Freq: Every day | INTRAVENOUS | Status: DC
  Administered 2022-10-22 – 2022-10-28 (×7): 40 mg via INTRAVENOUS
  Filled 2022-10-21 (×7): qty 10

## 2022-10-21 MED ORDER — SODIUM CHLORIDE 0.9% FLUSH: 3.0000 mL | Freq: Two times a day (BID) | INTRAVENOUS | Status: DC

## 2022-10-21 MED ORDER — HEPARIN (PORCINE) IN NACL 1000-0.9 UT/500ML-% IV SOLN
INTRAVENOUS | Status: AC
  Filled 2022-10-21: qty 500

## 2022-10-21 MED ORDER — FAMOTIDINE 20 MG PO TABS: 20.0000 mg | ORAL_TABLET | Freq: Two times a day (BID) | ORAL | Status: DC

## 2022-10-21 MED ORDER — EPINEPHRINE HCL 5 MG/250ML IV SOLN IN NS
INTRAVENOUS | Status: AC
  Filled 2022-10-21: qty 250

## 2022-10-21 MED ORDER — INSULIN ASPART 100 UNIT/ML IJ SOLN: 0.0000 [IU] | Freq: Three times a day (TID) | INTRAMUSCULAR | Status: DC

## 2022-10-21 MED ORDER — METHYLPREDNISOLONE SODIUM SUCC 125 MG IJ SOLR
120.0000 mg | INTRAMUSCULAR | Status: DC
  Administered 2022-10-21: 120 mg via INTRAVENOUS
  Filled 2022-10-21: qty 2

## 2022-10-21 MED ORDER — ORAL CARE MOUTH RINSE: 15.0000 mL | OROMUCOSAL | Status: DC | PRN

## 2022-10-21 MED ORDER — CALCIUM GLUCONATE-NACL 2-0.675 GM/100ML-% IV SOLN
2.0000 g | Freq: Once | INTRAVENOUS | Status: AC
  Administered 2022-10-21: 2000 mg via INTRAVENOUS
  Filled 2022-10-21: qty 100

## 2022-10-21 MED ORDER — NOREPINEPHRINE 16 MG/250ML-% IV SOLN
0.0000 ug/min | INTRAVENOUS | Status: DC
  Administered 2022-10-21: 28 ug/min via INTRAVENOUS
  Administered 2022-10-21: 40 ug/min via INTRAVENOUS
  Administered 2022-10-22: 15 ug/min via INTRAVENOUS
  Administered 2022-10-23: 10 ug/min via INTRAVENOUS
  Administered 2022-10-23: 9 ug/min via INTRAVENOUS
  Administered 2022-10-23: 15 ug/min via INTRAVENOUS
  Administered 2022-10-25: 10 ug/min via INTRAVENOUS
  Administered 2022-10-28: 5 ug/min via INTRAVENOUS
  Filled 2022-10-21 (×11): qty 250

## 2022-10-21 MED ORDER — FENTANYL CITRATE PF 50 MCG/ML IJ SOSY
PREFILLED_SYRINGE | INTRAMUSCULAR | Status: AC
  Administered 2022-10-21: 100 ug via INTRAVENOUS
  Filled 2022-10-21: qty 2

## 2022-10-21 MED ORDER — AMIODARONE HCL IN DEXTROSE 360-4.14 MG/200ML-% IV SOLN
INTRAVENOUS | Status: AC
  Filled 2022-10-21: qty 200

## 2022-10-21 MED ORDER — SODIUM BICARBONATE 8.4 % IV SOLN
INTRAVENOUS | Status: DC
  Filled 2022-10-21 (×3): qty 1000

## 2022-10-21 MED ORDER — SODIUM CHLORIDE 0.9% FLUSH
3.0000 mL | Freq: Two times a day (BID) | INTRAVENOUS | Status: DC
  Administered 2022-10-21 – 2022-10-25 (×4): 3 mL via INTRAVENOUS

## 2022-10-21 MED ORDER — HEPARIN SODIUM (PORCINE) 1000 UNIT/ML IJ SOLN
INTRAMUSCULAR | Status: AC
  Filled 2022-10-21: qty 10

## 2022-10-21 MED ORDER — LORAZEPAM 2 MG/ML IJ SOLN
2.0000 mg | Freq: Once | INTRAMUSCULAR | Status: AC
  Administered 2022-10-21: 2 mg via INTRAVENOUS
  Filled 2022-10-21: qty 1

## 2022-10-21 MED ORDER — LORAZEPAM 2 MG/ML IJ SOLN: 1.0000 mg | Freq: Once | INTRAMUSCULAR | Status: DC | PRN

## 2022-10-21 MED ORDER — FUROSEMIDE 10 MG/ML IJ SOLN
INTRAMUSCULAR | Status: DC | PRN
  Administered 2022-10-21: 40 mg via INTRAVENOUS
  Administered 2022-10-21: 80 mg via INTRAVENOUS

## 2022-10-21 MED ORDER — HEPARIN SODIUM (PORCINE) 1000 UNIT/ML IJ SOLN
INTRAMUSCULAR | Status: DC | PRN
  Administered 2022-10-21: 5000 [IU] via INTRAVENOUS

## 2022-10-21 MED ORDER — INFLUENZA VAC SPLIT QUAD 0.5 ML IM SUSY: 0.5000 mL | PREFILLED_SYRINGE | INTRAMUSCULAR | Status: DC | PRN

## 2022-10-21 MED ORDER — HEPARIN (PORCINE) IN NACL 1000-0.9 UT/500ML-% IV SOLN
INTRAVENOUS | Status: DC | PRN
  Administered 2022-10-21 (×2): 500 mL

## 2022-10-21 MED ORDER — FUROSEMIDE 10 MG/ML IJ SOLN
INTRAMUSCULAR | Status: AC
  Filled 2022-10-21: qty 12

## 2022-10-21 MED ORDER — APIXABAN 5 MG PO TABS: 5.0000 mg | ORAL_TABLET | Freq: Two times a day (BID) | ORAL | Status: DC

## 2022-10-21 MED ORDER — ACETAMINOPHEN 325 MG PO TABS: 650.0000 mg | ORAL_TABLET | Freq: Four times a day (QID) | ORAL | Status: DC | PRN

## 2022-10-21 MED ORDER — CHLORHEXIDINE GLUCONATE CLOTH 2 % EX PADS
6.0000 | MEDICATED_PAD | Freq: Every day | CUTANEOUS | Status: DC
  Administered 2022-10-21 – 2022-10-28 (×12): 6 via TOPICAL

## 2022-10-21 MED ORDER — PROPOFOL 1000 MG/100ML IV EMUL
INTRAVENOUS | Status: AC
  Administered 2022-10-21: 25 ug/kg/min via INTRAVENOUS
  Filled 2022-10-21: qty 100

## 2022-10-21 MED ORDER — FUROSEMIDE 10 MG/ML IJ SOLN: 60.0000 mg | Freq: Two times a day (BID) | INTRAMUSCULAR | Status: DC

## 2022-10-21 MED ORDER — AMIODARONE LOAD VIA INFUSION
150.0000 mg | Freq: Once | INTRAVENOUS | Status: AC
  Administered 2022-10-21: 150 mg via INTRAVENOUS
  Filled 2022-10-21: qty 83.34

## 2022-10-21 MED ORDER — VASOPRESSIN 20 UNITS/100 ML INFUSION FOR SHOCK
INTRAVENOUS | Status: AC
  Filled 2022-10-21: qty 100

## 2022-10-21 MED ORDER — ORAL CARE MOUTH RINSE: 15.0000 mL | OROMUCOSAL | Status: DC

## 2022-10-21 MED ORDER — MIDAZOLAM HCL 2 MG/2ML IJ SOLN
INTRAMUSCULAR | Status: AC
  Administered 2022-10-21: 2 mg via INTRAVENOUS
  Filled 2022-10-21: qty 2

## 2022-10-21 MED ORDER — SODIUM CHLORIDE 0.9 % IV SOLN: INTRAVENOUS | Status: DC | PRN

## 2022-10-21 MED ORDER — FUROSEMIDE 10 MG/ML IJ SOLN
INTRAMUSCULAR | Status: AC
  Filled 2022-10-21: qty 4

## 2022-10-21 MED ORDER — SODIUM CHLORIDE 0.9% IV SOLUTION: INTRAVENOUS | Status: DC | PRN

## 2022-10-21 MED ORDER — MILRINONE LACTATE IN DEXTROSE 20-5 MG/100ML-% IV SOLN
0.1250 ug/kg/min | INTRAVENOUS | Status: DC
  Administered 2022-10-21: 0.375 ug/kg/min via INTRAVENOUS
  Filled 2022-10-21 (×5): qty 100

## 2022-10-21 MED ORDER — SODIUM CHLORIDE 0.9 % IV SOLN
2.0000 g | Freq: Once | INTRAVENOUS | Status: AC
  Administered 2022-10-21: 2 g via INTRAVENOUS
  Filled 2022-10-21 (×2): qty 20

## 2022-10-21 MED ORDER — CALCIUM GLUCONATE-NACL 2-0.675 GM/100ML-% IV SOLN
2.0000 g | Freq: Once | INTRAVENOUS | Status: AC
  Administered 2022-10-22: 2000 mg via INTRAVENOUS
  Filled 2022-10-21: qty 100

## 2022-10-21 MED ORDER — MILRINONE LACTATE IN DEXTROSE 20-5 MG/100ML-% IV SOLN
INTRAVENOUS | Status: AC | PRN
  Administered 2022-10-21: .25 ug/kg/min via INTRAVENOUS

## 2022-10-21 MED ORDER — SODIUM BICARBONATE 8.4 % IV SOLN
INTRAVENOUS | Status: DC | PRN
  Administered 2022-10-21 (×4): 50 meq via INTRAVENOUS

## 2022-10-21 MED ORDER — AMIODARONE HCL IN DEXTROSE 360-4.14 MG/200ML-% IV SOLN
30.0000 mg/h | INTRAVENOUS | Status: DC
  Administered 2022-10-21 – 2022-10-28 (×15): 30 mg/h via INTRAVENOUS
  Filled 2022-10-21 (×16): qty 200

## 2022-10-21 MED ORDER — ONDANSETRON HCL 4 MG PO TABS: 4.0000 mg | ORAL_TABLET | Freq: Four times a day (QID) | ORAL | Status: DC | PRN

## 2022-10-21 MED ORDER — LABETALOL HCL 5 MG/ML IV SOLN: 10.0000 mg | INTRAVENOUS | Status: AC | PRN

## 2022-10-21 MED ORDER — ALBUMIN HUMAN 5 % IV SOLN
12.5000 g | Freq: Once | INTRAVENOUS | Status: AC
  Administered 2022-10-21: 12.5 g via INTRAVENOUS

## 2022-10-21 MED ORDER — POTASSIUM CHLORIDE 20 MEQ PO PACK
40.0000 meq | PACK | Freq: Once | ORAL | Status: AC
  Administered 2022-10-21: 40 meq
  Filled 2022-10-21: qty 2

## 2022-10-21 MED ORDER — MAGNESIUM SULFATE 2 GM/50ML IV SOLN
2.0000 g | Freq: Once | INTRAVENOUS | Status: AC
  Administered 2022-10-21: 2 g via INTRAVENOUS
  Filled 2022-10-21: qty 50

## 2022-10-21 MED ORDER — SODIUM CHLORIDE 0.9% FLUSH: 3.0000 mL | INTRAVENOUS | Status: DC | PRN

## 2022-10-21 MED ORDER — ROCURONIUM BROMIDE 10 MG/ML (PF) SYRINGE
PREFILLED_SYRINGE | INTRAVENOUS | Status: AC
  Administered 2022-10-21: 80 mg
  Filled 2022-10-21: qty 10

## 2022-10-21 MED ORDER — SODIUM BICARBONATE 8.4 % IV SOLN
INTRAVENOUS | Status: AC
  Administered 2022-10-21: 50 meq via INTRAVENOUS
  Filled 2022-10-21: qty 50

## 2022-10-21 MED ORDER — MIDAZOLAM HCL 2 MG/2ML IJ SOLN
1.0000 mg | INTRAMUSCULAR | Status: DC | PRN
  Filled 2022-10-21 (×2): qty 2

## 2022-10-21 MED ORDER — SODIUM CHLORIDE 0.9 % IV SOLN
500.0000 mg | INTRAVENOUS | Status: DC
  Administered 2022-10-21: 500 mg via INTRAVENOUS
  Filled 2022-10-21: qty 5

## 2022-10-21 MED ORDER — LIDOCAINE HCL (PF) 1 % IJ SOLN
INTRAMUSCULAR | Status: AC
  Filled 2022-10-21: qty 30

## 2022-10-21 MED ORDER — SODIUM BICARBONATE 8.4 % IV SOLN: 100.0000 meq | Freq: Once | INTRAVENOUS | Status: DC

## 2022-10-21 MED ORDER — SODIUM BICARBONATE 8.4 % IV SOLN: 50.0000 meq | Freq: Once | INTRAVENOUS | Status: AC

## 2022-10-21 MED ORDER — PNEUMOCOCCAL 20-VAL CONJ VACC 0.5 ML IM SUSY: 0.5000 mL | PREFILLED_SYRINGE | INTRAMUSCULAR | Status: DC | PRN

## 2022-10-21 MED ORDER — CHLORHEXIDINE GLUCONATE CLOTH 2 % EX PADS: 6.0000 | MEDICATED_PAD | Freq: Every day | CUTANEOUS | Status: DC

## 2022-10-21 MED ORDER — SODIUM CHLORIDE 0.9 % IV SOLN
4500.0000 mg | INTRAVENOUS | Status: AC
  Administered 2022-10-21: 4500 mg via INTRAVENOUS
  Filled 2022-10-21 (×2): qty 45

## 2022-10-21 MED ORDER — DOBUTAMINE IN D5W 4-5 MG/ML-% IV SOLN
INTRAVENOUS | Status: AC
  Filled 2022-10-21: qty 250

## 2022-10-21 MED ORDER — SODIUM CHLORIDE 0.9% FLUSH
3.0000 mL | Freq: Two times a day (BID) | INTRAVENOUS | Status: DC
  Administered 2022-10-21 – 2022-10-25 (×6): 3 mL via INTRAVENOUS

## 2022-10-21 SURGICAL SUPPLY — 16 items
BALLN IABP SENSA PLUS 8F 50CC (BALLOONS) ×1
BALLOON IABP SENS PLUS 8F 50CC (BALLOONS) IMPLANT
CATH SWAN GANZ VIP 7.5F (CATHETERS) IMPLANT
DEVICE RAD COMP TR BAND LRG (VASCULAR PRODUCTS) IMPLANT
ELECT DEFIB PAD ADLT CADENCE (PAD) IMPLANT
GLIDESHEATH SLEND SS 6F .021 (SHEATH) IMPLANT
KIT ARTERIAL CATH 20G 4.45CM (CATHETERS) IMPLANT
KIT MICROPUNCTURE NIT STIFF (SHEATH) IMPLANT
MAT PREVALON FULL STRYKER (MISCELLANEOUS) IMPLANT
PACK CARDIAC CATHETERIZATION (CUSTOM PROCEDURE TRAY) ×1 IMPLANT
SHEATH PINNACLE 8F 10CM (SHEATH) IMPLANT
SHEATH PINNACLE 9F 10CM (SHEATH) IMPLANT
SHEATH PROBE COVER 6X72 (BAG) IMPLANT
SLEEVE REPOSITIONING LENGTH 30 (MISCELLANEOUS) IMPLANT
TRANSDUCER W/STOPCOCK (MISCELLANEOUS) ×1 IMPLANT
WIRE EMERALD 3MM-J .035X150CM (WIRE) IMPLANT

## 2022-10-21 NOTE — H&P (View-Only) (Signed)
Heart Failure Consultation   Patient ID: Brendan Ellis MRN: [id number]; DOB: 06/26/1956  Admit date: 10/20/2022 Date of Consult: 10/21/2022  PCP:  Robert Bellow, PA-C   [place] Providers Cardiologist:  Mahala Menghini, MD        Patient Profile:   Brendan Ellis is a 67 y.o. 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 Broadus John.  History of Present Illness:   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   According to his daughter, he also has carvedilol at some point in the past, however I do not see carvedilol listed on the most recent medication list by his primary cardiologist.  He is on lisinopril 10 mg daily.  He was last seen by Dr. Salley Scarlet of [facility] on 08/03/2022 after his ICD interrogation revealed atrial flutter.  He was started on Eliquis at the time.  I am unable to see the raw EKG tracing, however per report, he was in sinus rhythm at the time.  Family thinks he may have taken himself off of the blood pressure medication including carvedilol.  It is not clear to me whether or not he was still taking Eliquis.    He had increasing dyspnea on exertion for the past 2 weeks.  Prior to 2 weeks ago, he was very active and that he denied any significant chest discomfort or palpitation.  He eventually sought medical attention at [facility] on 10/20/2022.  Blood work on initial arrival showed potassium of 2.8, creatinine of 0.64, BNP 1300.  White blood cell count of 10.6.  Viral panel negative for influenza and COVID.  Chest x-ray showed cardiomegaly with bilateral lower lobe hazy  opacities which likely represent bilateral pleural effusion with edema.  Initial EKG showed atrial flutter with RVR with heart rate in the 150s with ST downsloping and T wave inversion in the lateral leads.  CTA showed consolidative airspace disease in the dependent bilateral lung bases consistent with infection or aspiration, there was also finding of pulmonary edema and a loculated pleural effusion, newly enlarged mediastinal lymph node likely reactive.  Patient was given a total of 80 mg IV diuretic.  Creatinine trended up to 1.7 early this morning.  He was also treated with IV Lopressor given elevated heart rate and Solu-Medrol for breathing.  Around 4:30 AM this morning, patient went into PEA arrest and required 6 minutes of CPR and epi before ROSC.  No shock was delivered.    Past Medical History:  Diagnosis Date   Hypertension    Leukemia ([place])    PAF (paroxysmal atrial fibrillation) ([place]) 04/27/2018    History reviewed. No pertinent surgical history.   Home Medications:  Prior to Admission medications   Medication Sig Start Date End Date Taking? Authorizing Provider  apixaban (ELIQUIS) 5 MG TABS tablet Take 5 mg by mouth 2 (two) times daily. 08/03/22  Yes [provider]    Inpatient Medications: Scheduled Meds:  [MAR Hold] docusate  100 mg Per Tube BID   [MAR Hold] insulin aspart  0-15 Units Subcutaneous Q4H   [MAR Hold] methylPREDNISolone (SOLU-MEDROL) injection  120 mg Intravenous Q24H   [MAR Hold] mouth rinse  15 mL Mouth Rinse Q2H   [MAR Hold] pantoprazole (PROTONIX) IV  40 mg Intravenous Daily   phenylephrine       [MAR Hold] polyethylene glycol  17 g Per Tube Daily   [MAR Hold] potassium chloride  20 mEq Oral BID   [MAR Hold] sodium bicarbonate  100 mEq Intravenous Once   [MAR Hold] sodium chloride flush  3 mL Intravenous Q12H   Continuous Infusions:  [MAR Hold] sodium chloride     [MAR Hold] azithromycin Stopped (10/21/22 0420)   [MAR Hold] cefTRIAXone  (ROCEPHIN)  IV     [MAR Hold] epinephrine 25 mcg/min (10/21/22 0630)   [MAR Hold] norepinephrine (LEVOPHED) Adult infusion 40 mcg/min (10/21/22 0630)   sodium bicarbonate 150 mEq in dextrose 5 % 1,150 mL infusion 100 mL/hr at 10/21/22 0630   vasopressin 0.03 Units/min (10/21/22 0630)   PRN Meds: [UYQ Hold] sodium chloride, [MAR Hold] acetaminophen **OR** [MAR Hold] acetaminophen, [MAR Hold] fentaNYL (SUBLIMAZE) injection, [MAR Hold] midazolam, [MAR Hold] ondansetron **OR** [MAR Hold] ondansetron (ZOFRAN) IV, [MAR Hold] mouth rinse, phenylephrine, [MAR Hold] senna-docusate  Allergies:   No Known Allergies  Social History:   Social History   Socioeconomic History   Marital status: Single    Spouse name: Not on file   Number of children: Not on file   Years of education: Not on file   Highest education level: Not on file  Occupational History   Not on file  Tobacco Use   Smoking status: Never   Smokeless tobacco: Never  Substance and Sexual Activity   Alcohol use: Never   Drug use: Never   Sexual activity: Not on file  Other Topics Concern   Not on file  Social History Narrative   Not on file   Social Determinants of Health   Financial Resource Strain: Not on file  Food Insecurity: No Food Insecurity (10/21/2022)   Hunger Vital Sign    Worried About Running Out of Food in the Last Year: Never true    Ran Out of Food in the Last Year: Never true  Transportation Needs: No Transportation Needs (10/21/2022)   PRAPARE - Hydrologist (Medical): No    Lack of Transportation (Non-Medical): No  Physical Activity: Not on file  Stress: Not on file  Social Connections: Not on file  Intimate Partner Violence: Not At Risk (10/21/2022)   Humiliation, Afraid, Rape, and Kick questionnaire    Fear of Current or Ex-Partner: No    Emotionally Abused: No    Physically Abused: No    Sexually Abused: No    Family History:    Family History  Problem Relation  Age of Onset   Hypertension Mother    Diabetes Mother      ROS:  Please see the history of present illness.   All other ROS reviewed and negative.     Physical Exam/Data:   Vitals:   10/21/22 0645 10/21/22 0650 10/21/22 0700 10/21/22 0710  BP:   109/80   Pulse:   60 (!) 55  Resp: (!) 28 (!) 28 (!) 25 (!) 28  Temp: (!) 96 F (35.6 C)     TempSrc:      SpO2:   95% 95%  Weight:      Height:        Intake/Output Summary (Last 24 hours) at 10/21/2022 0803 Last data filed at 10/21/2022 0630 Gross per 24 hour  Intake 877.65 ml  Output 370 ml  Net 507.65 ml      10/21/2022    3:06 AM 10/20/2022   11:27 PM 12/20/2020    1:00 AM  Last 3 Weights  Weight (lbs) 197 lb 15.6 oz 194 lb 211 lb 3.2 oz  Weight (kg) 89.8 kg 87.998 kg 95.8 kg     Body mass index is 25.42 kg/m.  General: Intubated, on ventilator HEENT: normal Neck: no JVD Vascular: No carotid bruits; Distal pulses 2+ bilaterally Cardiac:  normal S1, S2; RRR; no murmur  Lungs:  clear to auscultation bilaterally, no wheezing, rhonchi or rales  Abd: soft, nontender, no hepatomegaly  Ext: no edema Musculoskeletal:  No deformities, BUE and BLE strength normal and equal Skin: warm and dry  Neuro:  CNs 2-12 intact, no focal abnormalities noted Psych:  Normal affect   EKG:  The EKG was personally reviewed and demonstrates: Atrial flutter with RVR, downsloping ST and T wave inversion in the lateral leads Telemetry:  Telemetry was personally reviewed and demonstrates: Patient arrived with atrial flutter with RVR, went into PEA arrest, after PEA arrest, patient was seen normal sinus rhythm with heart rate in the 80s.  Relevant CV Studies:  Limited echo 06/27/2019 Summary   1. Limited study to evaluate ejection fraction.    2. Moderately to severely decreased left ventricular systolic function,  ejection fraction 35%.    Left Ventricle    The left ventricular systolic function is moderately to severely decreased,   LVEF is visually estimated at 35%.   Laboratory Data:  High Sensitivity Troponin:   Recent Labs  Lab 10/20/22 1715 10/20/22 1932  TROPONINIHS 6 6     Chemistry Recent Labs  Lab 10/20/22 1715 10/20/22 1957 10/21/22 0101 10/21/22 0457 10/21/22 0500 10/21/22 0754  NA 138   < > 140 142 146* 141  K 2.8*   < > 3.2* 4.5 4.5 4.0  CL 105  --  103  --  101  --   CO2 24  --  16*  --  12*  --   GLUCOSE 177*  --  209*  --  139*  --   BUN 23  --  25*  --  29*  --   CREATININE 0.64  --  1.77*  --  2.44*  --   CALCIUM 7.7*  --  8.1*  --  7.6*  --   MG 1.4*  --  2.3  --  2.6*  --   GFRNONAA >60  --  42*  --  29*  --   ANIONGAP 9  --  21*  --  33*  --    < > = values in this interval not displayed.    Recent Labs  Lab 10/20/22 1715  PROT 7.1  ALBUMIN 3.2*  AST 53*  ALT 59*  ALKPHOS 32*  BILITOT 1.6*   Lipids No results for input(s): "CHOL", "TRIG", "HDL", "LABVLDL", "LDLCALC", "CHOLHDL" in the last 168 hours.  Hematology Recent Labs  Lab 10/20/22 1715 10/20/22 1957 10/21/22 0101 10/21/22 0457 10/21/22 0500 10/21/22 0754  WBC 10.6*  --  19.2*  --  19.9*  --   RBC 4.40  --  4.65  --  4.44  --   HGB 13.1   < > 14.0 13.3 13.4 13.3  HCT 37.5*   < > 40.7 39.0 39.6 39.0  MCV 85.2  --  87.5  --  89.2  --   MCH 29.8  --  30.1  --  30.2  --   MCHC 34.9  --  34.4  --  33.8  --   RDW 15.4  --  15.7*  --  16.0*  --   PLT 236  --  233  --  193  --    < > = values in this interval not displayed.   Thyroid No results for input(s): "TSH", "FREET4" in the last 168 hours.  BNP Recent Labs  Lab 10/20/22 1715  BNP 1,300.4*    DDimer No results for input(s): "DDIMER" in the last 168 hours.   Radiology/Studies:  DG CHEST PORT 1 VIEW  Result Date: 10/21/2022 CLINICAL DATA:  A 66 year old male presents for evaluation of hypoxia. EXAM: PORTABLE CHEST 1 VIEW COMPARISON:  October 20, 2022. FINDINGS: Endotracheal tube approximately 5.5 cm from the carina. Stable mild cardiac  enlargement. Pacer defibrillator remaining in place. Gastric tube coursing through in off the field of the radiograph, side port below the expected location of the GE junction. EKG leads project over the chest. Small bilateral pleural effusions, increased interstitial prominence and bibasilar airspace disease with slight increase since previous imaging. Skeletal structures are unremarkable to the extent evaluated on limited evaluation IMPRESSION: 1. Small bilateral pleural effusions, increased interstitial prominence and bibasilar airspace disease with slight increase since previous imaging. 2. Stable mild cardiac enlargement. 3. Post intubation, tip of the endotracheal tube in the proximal to mid trachea between clavicular heads. Electronically Signed   By: Zetta Bills M.D.   On: 10/21/2022 07:46   DG CHEST PORT 1 VIEW  Result Date: 10/21/2022 CLINICAL DATA:  66 year old male presenting for evaluation of the chest following intubation. EXAM: PORTABLE CHEST 1 VIEW COMPARISON:  Imaging from November 23rd and October 21, 2022. FINDINGS: EKG leads project over the chest. Pacer pads project over the chest. Dual lead pacer defibrillator remains in place. Post intubation, endotracheal tube tip approximately 4.5 cm from the carina tip just below clavicular heads. Gastric tube coursing into the upper abdomen, side port below GE junction. Cardiomediastinal contours are stable with persistent mild cardiac enlargement accentuated by AP projection. Unchanged appearance of basilar airspace disease and increased interstitial markings. Mild blunting of costodiaphragmatic sulci since previous imaging raising the question of very small bilateral pleural effusions. No visible pneumothorax. Skeletal structures are unremarkable to the extent evaluated on limited evaluation IMPRESSION: 1. Patient remains intubated with endotracheal tube tip just below the carina in the mid trachea. 2. Stable appearance of remaining support  devices. 3. Stable appearance of diffuse interstitial and airspace disease greatest at the lung bases, again suspicious for infection perhaps aspiration related. Electronically Signed   By: Zetta Bills M.D.   On: 10/21/2022 07:42   CT Angio Chest PE W and/or Wo Contrast  Result Date: 10/20/2022 CLINICAL DATA:  PE suspected, high probability EXAM: CT ANGIOGRAPHY CHEST WITH CONTRAST TECHNIQUE: Multidetector CT imaging of the chest was performed using the standard protocol during bolus administration of intravenous contrast. Multiplanar CT image reconstructions and MIPs were obtained to evaluate the vascular anatomy. RADIATION DOSE REDUCTION: This exam was performed according to the departmental dose-optimization program which includes automated exposure control, adjustment of the mA and/or kV according to patient size and/or use of iterative reconstruction technique. CONTRAST:  24m OMNIPAQUE IOHEXOL 350 MG/ML SOLN COMPARISON:  02/25/2018 FINDINGS: Cardiovascular: Satisfactory opacification of the pulmonary arteries to the segmental level. No evidence of pulmonary embolism. Gross cardiomegaly. Left coronary artery calcifications. No pericardial effusion. Aortic atherosclerosis. Mediastinum/Nodes: Newly enlarged mediastinal  lymph nodes, pretracheal nodes measuring up to 2.2 x 1.8 cm (series 6, image 138) thyroid gland, trachea, and esophagus demonstrate no significant findings. Lungs/Pleura: Unchanged small, chronic, loculated pleural effusions with associated atelectasis consolidation. New, heterogeneous and consolidative airspace opacity in the dependent bilateral lung bases (series 5, image 91). Additional interlobular septal thickening throughout the lungs. Fissural nodules or pleural fluid loculations along the right minor fissure are stable and definitively benign (series 7, image 61). Upper Abdomen: No acute abnormality. Musculoskeletal: No chest wall abnormality. No acute osseous findings. Review of the  MIP images confirms the above findings. IMPRESSION: 1. Negative examination for pulmonary embolism. 2. Heterogeneous consolidative airspace opacity of the dependent bilateral lung bases, consistent with infection or aspiration. 3. Interlobular septal thickening throughout the lungs, consistent with pulmonary edema. 4. Unchanged small, chronic, loculated pleural effusions with associated atelectasis or consolidation. 5. Newly enlarged mediastinal lymph nodes, likely reactive. 6. Gross cardiomegaly and coronary artery disease. Aortic Atherosclerosis (ICD10-I70.0). Electronically Signed   By: Delanna Ahmadi M.D.   On: 10/20/2022 19:09   DG Chest Port 1 View  Result Date: 10/20/2022 CLINICAL DATA:  Chest pain. EXAM: PORTABLE CHEST 1 VIEW COMPARISON:  Chest radiograph dated May 28, 2018 FINDINGS: The heart is enlarged. Pacemaker leads in the right atrium and right ventricle. Bilateral lower lobe hazy opacities, which may represent bilateral pleural effusions and/or mild edema. No acute osseous abnormality IMPRESSION: Cardiomegaly with bilateral lower lobe hazy opacities, which may represent bilateral pleural effusions and/or mild edema. Electronically Signed   By: Keane Police D.O.   On: 10/20/2022 17:36     Assessment and Plan:   Acute respiratory failure/pulmonary edema: CTA concerning acute heart failure, however cannot rule out possible infection/aspiration either, currently on antibiotic.  Suspect heart failure driven by atrial flutter with RVR of unknown duration.  Pending device interrogation to figure out the duration of atrial flutter.  Patient received a total of 80 mg of IV Lasix last night.  Plans Swan-Ganz catheter placement.  PEA arrest/shock: Likely related to respiratory failure.  On vasopressin and norepi.  No longer on epinephrine.  Amiodarone weaned off.  Atrial flutter with RVR: Patient arrived with atrial flutter with RVR with heart rate in the 150s, unknown duration.  Will obtain ICD  interrogation to figure out the duration of atrial flutter prior to arrival.  After PEA arrest, patient was placed on amiodarone for short period of time.  He has converted back to normal sinus rhythm.  History of nonischemic cardiomyopathy: Felt to be chemo induced.  Pending echocardiogram to reassess the ejection fraction.  Last EF in 2020 was 35%.  Per the daughter, she suspects the patient has taken himself off of all heart failure medications prior to hospitalization  HFrEF s/p Biotronik dual-chamber PPM: Pending device interrogation.  AKI: Hold off on further diuretic until we can get a Swan catheter in.  Received 80 mg of IV Lasix last night prior to midnight, creatinine trended up from 0.6-1.7 early this morning.  Creatinine further deteriorated to 2.4 after arrest.    Risk Assessment/Risk Scores:        New York Heart Association (NYHA) Functional Class NYHA Class III  CHA2DS2-VASc Score = 1   This indicates a 0.6% annual risk of stroke. The patient's score is based upon: CHF History: 1 HTN History: 0 Diabetes History: 0 Stroke History: 0 Vascular Disease History: 0 Age Score: 0 Gender Score: 0         For questions or updates,  please contact [place] Please consult [email or web address] for contact info under    Hilbert Corrigan, [place]  10/21/2022 8:03 AM  Agree with above.   66 y/o male with h/o systolic HF due to NICM EF 35%, AML s/p chemo 2019 (in remission), PAFL whom we are asked to see due to shock and PEA arrest.   Has been feeling bad for several weeks. Presented to ER in shock with AFL at 150. Eventual PEA arrest with 6 mins CPR. Now intubated. Back in NSR. Lactate > 9 Scr 1.0 -> 2.44  CT chest with bilateral LL PNA. Sats ok on vent.   Now intubated/sedated. BP improved on NE 10 and VP 0.02 Epi and DBA off  Bedside echo done personally  EF ~20% RV moderately down   General:  Intubated/sedated HEENT: normal Neck: supple. JVP to jaw  Carotids 2+  bilat; no bruits. No lymphadenopathy or thryomegaly appreciated. Cor: PMI nondisplaced. Regular rate & rhythm.+s3 Lungs: clear Abdomen: soft, nontender, nondistended. No hepatosplenomegaly. No bruits or masses. Good bowel sounds. Extremities: no cyanosis, clubbing, rash, edema Neuro: intubated/sedated   Profound cardiogenic shock likely due to tachy-CM on top of pre-existing CM. Doubt he needs ECMO at this time as oxygenation improving and pressor requirements coming down. Will take to cath lab for swan and potential mechanical support. Start amio to help maintain NSR.   CRITICAL CARE Performed by: Glori Bickers  Total critical care time: 65 minutes  Critical care time was exclusive of separately billable procedures and treating other patients.  Critical care was necessary to treat or prevent imminent or life-threatening deterioration.  Critical care was time spent personally by me (independent of midlevel providers or residents) on the following activities: development of treatment plan with patient and/or surrogate as well as nursing, discussions with consultants, evaluation of patient's response to treatment, examination of patient, obtaining history from patient or surrogate, ordering and performing treatments and interventions, ordering and review of laboratory studies, ordering and review of radiographic studies, pulse oximetry and re-evaluation of patient's condition.  Glori Bickers, MD  8:19 AM

## 2022-10-21 NOTE — Progress Notes (Signed)
.  LTM

## 2022-10-21 NOTE — Assessment & Plan Note (Signed)
Last EF 35% in July 2020.  Has AICD in place.  Not currently on any guideline-directed medical therapy.  Pulmonary edema noted throughout the lungs. -Continue IV Lasix 60 mg twice daily -Obtain echocardiogram -Further GDMT pending TTE results -Cardiology, Dr. Terrence Dupont, to consult

## 2022-10-21 NOTE — Progress Notes (Signed)
Pt developed worsening respiratory distress with increased work of breathing.  This was associated with hypotension.  He required intubation.  He was started on pressors.  Shortly after intubation he developed PEA.  CPR started immediately.  He was given epinephrine and bicarbonate.  He had ROSC after about 6 minutes.  CC time 38 minutes.  Chesley Mires, MD [place] Pager - [phone] 10/21/2022, 4:56 AM

## 2022-10-21 NOTE — Progress Notes (Addendum)
Received patient at ~0300 RRT for respiratory distress. Patient came to 2H14 on [place] 100% oxygen saturation >92%. MD assessed at bedside.  At ~0400 patient became restless, agitated, and had increased WOB. MD paged. Patient intubated at 0434.   At 0440 patient went into PEA, CPR started, MD at bedside. ROSC 0447.

## 2022-10-21 NOTE — Progress Notes (Signed)
Transported pt back to 2H14 from cath lab on ventilator. Pt stable throughout with no complications. VS WNL. Increased Peep to 12 and decreased FiO2 to 80% per CCM MD based on ABG

## 2022-10-21 NOTE — Progress Notes (Signed)
   10/21/22 0018  Vent Select  $ Ventilator Initial/Subsequent  Initial  Invasive or Noninvasive Noninvasive  Adult Vent Y  Adult Ventilator Settings  Vent Type Servo i  Vent Mode BIPAP;PCV  Set Rate 8 bmp  FiO2 (%) 50 %  Pressure Control 6 cmH20  PEEP 6 cmH20  PIP Set (cm H2O) 18 cm H2O  Adult Ventilator Measurements  Peak Airway Pressure 21 L/min  Resp Rate Spontaneous 26 br/min  Resp Rate Total 38 br/min  Exhaled Vt 903 mL  Measured Ve 26.4 mL  I:E Ratio Measured 1:1.1  Auto PEEP 2 cmH20  Total PEEP 8 cmH20  SpO2 100 %  Adult Ventilator Alarms  Alarms On Y  Ve High Alarm 40 L/min  Ve Low Alarm 5 L/min  Resp Rate High Alarm 46 br/min  Resp Rate Low Alarm 5  PEEP Low Alarm 4 cmH2O  Press High Alarm 25 cmH2O  T Apnea 20 sec(s)   Placed pt. On bipap for increased wob pt. Tolerating bipap settings.

## 2022-10-21 NOTE — Progress Notes (Addendum)
Date and time results received: 10/21/22 0150     Test: lactic acid   Critical Value: 8.2  Name of Provider Notified: Dr C. Hall   Orders Received? Or Actions Taken?: On call Dr. Informed + Critical care MD at bedside

## 2022-10-21 NOTE — Progress Notes (Signed)
I responded to a page from the nurse to provide spiritual support for the patient' wife and family. I arrived at the [facility] waiting area where the patient's wife was waiting. I provided spiritual support through pastoral presence, by sharing words of encouragement, and by leading in prayer.    10/21/22 0535  Clinical Encounter Type  Visited With Family;Patient not available  Visit Type Initial;Spiritual support;Code  Referral From Nurse  Consult/Referral To Chaplain  Spiritual Encounters  Spiritual Needs Prayer;[facility] text    Chaplain Dr Redgie Grayer

## 2022-10-21 NOTE — Procedures (Signed)
Arterial Catheter Insertion Procedure Note  Brendan Ellis  [id number]  13-Nov-1956  Date:10/21/22  Time:5:08 AM    Provider Performing: Corey Harold    Procedure: Insertion of Arterial Line [phone]) with US guidance ([id number])   Indication(s) Blood pressure monitoring and/or need for frequent ABGs  Consent Unable to obtain consent due to emergent nature of procedure.  Anesthesia None   Time Out Verified patient identification, verified procedure, site/side was marked, verified correct patient position, special equipment/implants available, medications/allergies/relevant history reviewed, required imaging and test results available.   Sterile Technique Maximal sterile technique was not able to be achieved due to emergent nature of procedure.   Procedure Description Area of catheter insertion was cleaned with chlorhexidine and draped in sterile fashion. With real-time ultrasound guidance an arterial catheter was placed into the right femoral artery.  Appropriate arterial tracings confirmed on monitor.     Complications/Tolerance None; patient tolerated the procedure well.   EBL Minimal   Specimen(s) None   Brendan Ellis, AGACNP-BC [facility]  See Amion for personal pager PCCM on call pager [phone] until 7pm. Please call [place] 7p-7a. [phone]  10/21/2022 5:09 AM

## 2022-10-21 NOTE — Assessment & Plan Note (Signed)
Presenting with A-fib RVR rates 130-140s.  Does not appear to be on any rate/rhythm controlling medications as an outpatient.  Recently started on Eliquis. -Continue IV amiodarone infusion -IV metoprolol as needed given with some improvement in heart rate -Obtain echocardiogram -Cardiology, Dr. Terrence Dupont, to see -Continue Eliquis

## 2022-10-21 NOTE — Assessment & Plan Note (Signed)
IV supplement given in ED.  Repeat labs in AM.

## 2022-10-21 NOTE — Progress Notes (Signed)
eLink Physician-Brief Progress Note Patient Name: Brendan Ellis DOB: December 15, 1955 MRN: [id number]   Date of Service  10/21/2022  HPI/Events of Note  Ca++ 0.82  eICU Interventions  Calcium gluconate 2 gm iv x 1 with Ca++ check at 11:00 pm.        Frederik Pear 10/21/2022, 7:41 PM

## 2022-10-21 NOTE — Hospital Course (Signed)
Brendan Ellis is a 66 y.o. 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

## 2022-10-21 NOTE — Progress Notes (Addendum)
Spoke to the nurse. CT is pending and EEG is preferred to be applied afterwards.

## 2022-10-21 NOTE — Progress Notes (Signed)
Pt. Removed the bipap doesn't want to wear it  and having difficulty breathing and feeling short of breath RN aware waiting on doctor. Pt. Is off and the bipap having a hard time breathing.

## 2022-10-21 NOTE — Progress Notes (Signed)
Reassessed pt after arrival to ICU.  He is now on heated high flow oxygen.  He reports feeling more comfortable with his breathing and wants to try continuing current therapies.  Will continue close monitoring in ICU since he might still need intubation.  Chesley Mires, MD [facility] Pager - [phone] 10/21/2022, 3:24 AM

## 2022-10-21 NOTE — Progress Notes (Signed)
[facility] Progress Note Patient Name: Brendan Ellis DOB: 01/08/1956 MRN: [id number]   Date of Service  10/21/2022  HPI/Events of Note  ABG reviewed. Ionized calcium 0.85.  eICU Interventions  Respiratory rate on the ventilator reduced to 20, ABG in 1 hour, Calcium gluconate 2 gm iv x 1.        [name] 10/21/2022, 11:42 PM

## 2022-10-21 NOTE — Consult Note (Signed)
Neurology Consultation Reason for Consult: Post anoxic myoclonus Referring Physician: Tacy Learn, S  CC: Post anoxic myoclonus  History is obtained from: Chart review  HPI: Rahil Passey is a 66 y.o. male with history of hypertension, atrial fibrillation admitted with acute pulmonary edema concerning for acute heart failure.  He was having trouble oxygenating, and was on the floor on BiPAP but took it off.  He was then started on heated high flow oxygen.  He then became hypoxemic with SpO2's in the 50s at 4 AM and 4:15 AM documented. he then was intubated at 4:34 AM, and subsequently went into PEA with an arrest time of 7 minutes.  He has had persistent cardiogenic shock ever since that time, required multiple pressors and ultimately ended up having a ventricular assist device placed.  This afternoon, he began having some myoclonic jerks.  This resolved with 2 mg of IV Ativan.  He was then seen to have some more, and was loaded with 4.5 g of IV Keppra.  Past Medical History:  Diagnosis Date   Hypertension    Leukemia ([place])    PAF (paroxysmal atrial fibrillation) ([place]) 04/27/2018     Family History  Problem Relation Age of Onset   Hypertension Mother    Diabetes Mother      Social History:  reports that he has never smoked. He has never used smokeless tobacco. He reports that he does not drink alcohol and does not use drugs.   Exam: Current vital signs: BP 102/66   Pulse (!) 107   Temp (!) 97.3 F (36.3 C)   Resp (!) 25   Ht '6\' 2"'$  (1.88 m)   Wt 89.8 kg   SpO2 100%   BMI 25.42 kg/m  Vital signs in last 24 hours: Temp:  [93.2 F (34 C)-97.6 F (36.4 C)] 97.3 F (36.3 C) (11/24 1830) Pulse Rate:  [0-147] 107 (11/24 1830) Resp:  [9-56] 25 (11/24 1830) BP: (75-149)/(52-132) 102/66 (11/24 1800) SpO2:  [26 %-100 %] 100 % (11/24 1830) Arterial Line BP: (72-137)/(20-95) 94/64 (11/24 1830) FiO2 (%):  [0 %-100 %] 80 % (11/24 1600) Weight:  [88 kg-89.8 kg] 89.8 kg (11/24  0306)   Physical Exam  In bed, intubated  Neuro: Mental Status: He does not open eyes or follow commands Cranial Nerves: II: He does not blink to threat. Pupils are equal, round, and reactive to light.   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  Even though his downtime was on the shorter side, given the severe hypoxemia before hand and severe shock that has been problematic afterwards, it is very possible that he has had some degree of injury.  His ionized calcium has been extremely low, which could certainly be contributing to both muscle twitching, refractory hypotension, as well as seizures and so this will need to be addressed and I discussed this with e-link.   Recommendations: 1) hypocalcemia treatment per critical care 2) continue Keppra 500 twice daily 3) LTM EEG with titration of seizure medicine/sedatives based on EEG 4) neurology will continue to follow  This patient  is critically ill and at significant risk of neurological worsening, death and care requires constant monitoring of vital signs, hemodynamics,respiratory and cardiac monitoring, neurological assessment, discussion with family, other specialists and medical decision making of high complexity. I spent 50 minutes of neurocritical care time  in the care of  this patient. This was time spent independent of any time provided by nurse practitioner or PA.  Roland Rack, MD [facility] [phone]  If 7pm- 7am, please page neurology on call as  listed in [facility] [place]. 10/21/2022  7:18 PM

## 2022-10-21 NOTE — Significant Event (Incomplete)
Rapid Response Event Note   Reason for Call :    Initial Focused Assessment:       Interventions:    Plan of Care:     Event Summary:   MD Notified:  Call Time: Arrival Time: End Time:  Henreitta Leber, RN

## 2022-10-21 NOTE — Progress Notes (Addendum)
Patient came in 2250 from [facility]   10/20/22 2255  Assess: MEWS Score  Temp (!) 96.9 F (36.1 C)  BP 107/81  Pulse Rate (!) 135  ECG Heart Rate (!) 135  Resp (!) 25  Level of Consciousness Alert  SpO2 94 %  O2 Device HFNC  O2 Flow Rate (L/min) 15 L/min  Assess: MEWS Score  MEWS Temp 0  MEWS Systolic 0  MEWS Pulse 3  MEWS RR 1  MEWS LOC 0  MEWS Score 4  MEWS Score Color Red  Treat  Pain Scale 0-10  Pain Score 0  Take Vital Signs  Increase Vital Sign Frequency  Red: Q 1hr X 4 then Q 4hr X 4, if remains red, continue Q 4hrs  Escalate  MEWS: Escalate Red: discuss with charge nurse/RN and provider, consider discussing with RRT  Notify: Charge Nurse/RN  Name of Charge Nurse/RN Notified Dana, RN  Date Charge Nurse/RN Notified 10/20/22  Time Charge Nurse/RN Notified 2255  Provider Notification  Provider Name/Title Dr Posey Pronto  Date Provider Notified 10/20/22  Time Provider Notified 2250  Method of Notification Page;Face-to-face  Notification Reason Change in status  Provider response At bedside;En route;See new orders  Date of Provider Response 10/20/22  Time of Provider Response 2315  Assess: SIRS CRITERIA  SIRS Temperature  0  SIRS Pulse 1  SIRS Respirations  1  SIRS WBC 1  SIRS Score Sum  3

## 2022-10-21 NOTE — Progress Notes (Signed)
Updated his family about events.  The mother of his children is Retail buyer of medical ICU at [facility].  Family would like to arrange for transfer to [facility] when he is medically stable.  Chesley Mires, MD [facility] Pager - [phone] 10/21/2022, 5:22 AM

## 2022-10-21 NOTE — Consult Note (Signed)
ECMO Consult Note   Called to 2H14 for ECMO Consult at 7 AM by Dr. Haroldine Laws Admitting Diagnosis-mixed cardiogenic and septic shock with respiratory failure Primary Issue-cardiogenic shock Age:66 y.o.   Weight: 89.8 kg  Days on Mechanical Ventilation- 0  MAP FiO2 Oxygen Index P/F Ratio         Vasopressors yes   MSOF Yes    SAVE score (VA-ECMO) : - 8, with 30% in-hospital survival [email or web address].save-[email or web address]  Recent Blood Gas:     Component Value Date/Time   PHART 7.282 (L) 10/21/2022 1004   PCO2ART 27.4 (L) 10/21/2022 1004   PO2ART 145 (H) 10/21/2022 1004   HCO3 12.9 (L) 10/21/2022 1004   TCO2 14 (L) 10/21/2022 1004   ACIDBASEDEF 12.0 (H) 10/21/2022 1004   O2SAT 99 10/21/2022 1004    Coags: No results found for: "PTT", "INR", "FIBRINOGEN"  CBC    Component Value Date/Time   WBC 19.9 (H) 10/21/2022 0500   RBC 4.44 10/21/2022 0500   HGB 11.9 (L) 10/21/2022 1004   HCT 35.0 (L) 10/21/2022 1004   PLT 193 10/21/2022 0500   MCV 89.2 10/21/2022 0500   MCH 30.2 10/21/2022 0500   MCHC 33.8 10/21/2022 0500   RDW 16.0 (H) 10/21/2022 0500   LYMPHSABS 1.9 10/20/2022 1715   MONOABS 0.9 10/20/2022 1715   EOSABS 0.0 10/20/2022 1715   BASOSABS 0.0 10/20/2022 1715    BMET    Component Value Date/Time   NA 147 (H) 10/21/2022 1004   K 2.9 (L) 10/21/2022 1004   CL 101 10/21/2022 0500   CO2 12 (L) 10/21/2022 0500   GLUCOSE 139 (H) 10/21/2022 0500   BUN 29 (H) 10/21/2022 0500   CREATININE 2.44 (H) 10/21/2022 0500   CALCIUM 7.6 (L) 10/21/2022 0500   GFRNONAA 29 (L) 10/21/2022 0500   GFRAA >60 04/29/2018 0458                                                                                                                                                             ECMO physician Dr. Tacy Learn notified at 6:55 am  Candidate meets ECMO Criteria- No   Additional notes-patient's vasopressor requirement is improving, he started making urine Reason for Consult: Evaluation of  possible VA ECMO Referring Physician: Dr. Marzetta Board is an 66 y.o. male.  HPI: 66 year old male with nonischemic dilated cardiomyopathy, HFrEF s/p AICD, paroxysmal A-fib on Eliquis and leukemia s/p chemotherapy now in remission was brought to the emergency department with complaint of cough, sore throat and shortness of breath for about 1 week.  Upon presentation he was noted to be in RVR, was started on amiodarone, CT chest was done which showed bilateral infiltrate, with small effusion and pulmonary edema PE was ruled out, overnight patient went into respiratory distress, became hypoxic  and went into PEA cardiac arrest, ROSC was achieved after 1 cycle of CPR, he was intubated and placed on mechanical ventilation.  Postarrest patient became hypotensive, requiring vasopressor support  Past Medical History:  Diagnosis Date   Hypertension    Leukemia ([place])    PAF (paroxysmal atrial fibrillation) ([place]) 04/27/2018    History reviewed. No pertinent surgical history.  Family History  Problem Relation Age of Onset   Hypertension Mother    Diabetes Mother     Social History:  reports that he has never smoked. He has never used smokeless tobacco. He reports that he does not drink alcohol and does not use drugs.  Allergies: No Known Allergies  Medications: I have reviewed the patient's current medications.  Results for orders placed or performed during the hospital encounter of 10/20/22 (from the past 48 hour(s))  Resp Panel by RT-PCR (Flu A&B, Covid) Anterior Nasal Swab     Status: None   Collection Time: 10/20/22  5:15 PM   Specimen: Anterior Nasal Swab  Result Value Ref Range   SARS Coronavirus 2 by RT PCR NEGATIVE NEGATIVE    Comment: (NOTE) SARS-CoV-2 target nucleic acids are NOT DETECTED.  The SARS-CoV-2 RNA is generally detectable in upper respiratory specimens during the acute phase of infection. The lowest concentration of SARS-CoV-2 viral copies this assay can  detect is 138 copies/mL. A negative result does not preclude SARS-Cov-2 infection and should not be used as the sole basis for treatment or other patient management decisions. A negative result may occur with  improper specimen collection/handling, submission of specimen other than nasopharyngeal swab, presence of viral mutation(s) within the areas targeted by this assay, and inadequate number of viral copies(<138 copies/mL). A negative result must be combined with clinical observations, patient history, and epidemiological information. The expected result is Negative.  Fact Sheet for Patients:  EntrepreneurPulse.com.au  Fact Sheet for Healthcare Providers:  IncredibleEmployment.be  This test is no t yet approved or cleared by the [place] FDA and  has been authorized for detection and/or diagnosis of SARS-CoV-2 by FDA under an Emergency Use Authorization (EUA). This EUA will remain  in effect (meaning this test can be used) for the duration of the COVID-19 declaration under Section 564(b)(1) of the Act, 21 U.S.C.section 360bbb-3(b)(1), unless the authorization is terminated  or revoked sooner.       Influenza A by PCR NEGATIVE NEGATIVE   Influenza B by PCR NEGATIVE NEGATIVE    Comment: (NOTE) The Xpert Xpress SARS-CoV-2/FLU/RSV plus assay is intended as an aid in the diagnosis of influenza from Nasopharyngeal swab specimens and should not be used as a sole basis for treatment. Nasal washings and aspirates are unacceptable for Xpert Xpress SARS-CoV-2/FLU/RSV testing.  Fact Sheet for Patients: EntrepreneurPulse.com.au  Fact Sheet for Healthcare Providers: IncredibleEmployment.be  This test is not yet approved or cleared by the [place] FDA and has been authorized for detection and/or diagnosis of SARS-CoV-2 by FDA under an Emergency Use Authorization (EUA). This EUA will remain in effect  (meaning this test can be used) for the duration of the COVID-19 declaration under Section 564(b)(1) of the Act, 21 U.S.C. section 360bbb-3(b)(1), unless the authorization is terminated or revoked.  Performed at [facility], [address] [place], [place] [address]   CBC with Differential     Status: Abnormal   Collection Time: 10/20/22  5:15 PM  Result Value Ref Range   WBC 10.6 (H) 4.0 - 10.5 K/uL   RBC  4.40 4.22 - 5.81 MIL/uL   Hemoglobin 13.1 13.0 - 17.0 g/dL   HCT 37.5 (L) 39.0 - 52.0 %   MCV 85.2 80.0 - 100.0 fL   MCH 29.8 26.0 - 34.0 pg   MCHC 34.9 30.0 - 36.0 g/dL   RDW 15.4 11.5 - 15.5 %   Platelets 236 150 - 400 K/uL   nRBC 0.0 0.0 - 0.2 %   Neutrophils Relative % 73 %   Neutro Abs 7.7 1.7 - 7.7 K/uL   Lymphocytes Relative 18 %   Lymphs Abs 1.9 0.7 - 4.0 K/uL   Monocytes Relative 9 %   Monocytes Absolute 0.9 0.1 - 1.0 K/uL   Eosinophils Relative 0 %   Eosinophils Absolute 0.0 0.0 - 0.5 K/uL   Basophils Relative 0 %   Basophils Absolute 0.0 0.0 - 0.1 K/uL   Immature Granulocytes 0 %   Abs Immature Granulocytes 0.04 0.00 - 0.07 K/uL    Comment: Performed at [facility], [address] [place], [place] [address]  Comprehensive metabolic panel     Status: Abnormal   Collection Time: 10/20/22  5:15 PM  Result Value Ref Range   Sodium 138 135 - 145 mmol/L   Potassium 2.8 (L) 3.5 - 5.1 mmol/L   Chloride 105 98 - 111 mmol/L   CO2 24 22 - 32 mmol/L   Glucose, Bld 177 (H) 70 - 99 mg/dL    Comment: Glucose reference range applies only to samples taken after fasting for at least 8 hours.   BUN 23 8 - 23 mg/dL   Creatinine, Ser 0.64 0.61 - 1.24 mg/dL   Calcium 7.7 (L) 8.9 - 10.3 mg/dL   Total Protein 7.1 6.5 - 8.1 g/dL   Albumin 3.2 (L) 3.5 - 5.0 g/dL   AST 53 (H) 15 - 41 U/L   ALT 59 (H) 0 - 44 U/L   Alkaline Phosphatase 32 (L) 38 - 126 U/L   Total Bilirubin 1.6 (H) 0.3 - 1.2 mg/dL   GFR, Estimated >60 >60 mL/min    Comment:  (NOTE) Calculated using the CKD-EPI Creatinine Equation (2021)    Anion gap 9 5 - 15    Comment: Performed at [facility], [address] [place], [place] [address]  Brain natriuretic peptide     Status: Abnormal   Collection Time: 10/20/22  5:15 PM  Result Value Ref Range   B Natriuretic Peptide 1,300.4 (H) 0.0 - 100.0 pg/mL    Comment: Performed at [facility], [address] [place], [place] [address]  Troponin I (High Sensitivity)     Status: None   Collection Time: 10/20/22  5:15 PM  Result Value Ref Range   Troponin I (High Sensitivity) 6 <18 ng/L    Comment: (NOTE) Elevated high sensitivity troponin I (hsTnI) values and significant  changes across serial measurements may suggest ACS but many other  chronic and acute conditions are known to elevate hsTnI results.  Refer to the "Links" section for chest pain algorithms and additional  guidance. Performed at [facility], [address] [place], [place] [address]   Magnesium     Status: Abnormal   Collection Time: 10/20/22  5:15 PM  Result Value Ref Range   Magnesium 1.4 (L) 1.7 - 2.4 mg/dL    Comment: Performed at [facility], [address] [place], [place] [address]  Troponin I (High Sensitivity)  Status: None   Collection Time: 10/20/22  7:32 PM  Result Value Ref Range   Troponin I (High Sensitivity) 6 <18 ng/L    Comment: (NOTE) Elevated high sensitivity troponin I (hsTnI) values and significant  changes across serial measurements may suggest ACS but many other  chronic and acute conditions are known to elevate hsTnI results.  Refer to the "Links" section for chest pain algorithms and additional  guidance. Performed at [facility], [address] [place], [place] [address]   I-Stat venous blood gas, ED     Status: Abnormal   Collection Time: 10/20/22  7:57 PM  Result Value Ref Range   pH, Ven 7.458 (H) 7.25 - 7.43   pCO2, Ven 35.3 (L) 44 - 60 mmHg    pO2, Ven 31 (LL) 32 - 45 mmHg   Bicarbonate 25.1 20.0 - 28.0 mmol/L   TCO2 26 22 - 32 mmol/L   O2 Saturation 65 %   Acid-Base Excess 1.0 0.0 - 2.0 mmol/L   Sodium 138 135 - 145 mmol/L   Potassium 3.1 (L) 3.5 - 5.1 mmol/L   Calcium, Ion 1.03 (L) 1.15 - 1.40 mmol/L   HCT 42.0 39.0 - 52.0 %   Hemoglobin 14.3 13.0 - 17.0 g/dL   Patient temperature 97.6 F    Sample type VENOUS    Comment NOTIFIED PHYSICIAN   HIV Antibody (routine testing w rflx)     Status: None   Collection Time: 10/21/22  1:01 AM  Result Value Ref Range   HIV Screen 4th Generation wRfx Non Reactive Non Reactive    Comment: Performed at [facility], [address], [place], [place] [address]  Magnesium     Status: None   Collection Time: 10/21/22  1:01 AM  Result Value Ref Range   Magnesium 2.3 1.7 - 2.4 mg/dL    Comment: Performed at [facility] [address] [place], [place] [address]  Basic metabolic panel     Status: Abnormal   Collection Time: 10/21/22  1:01 AM  Result Value Ref Range   Sodium 140 135 - 145 mmol/L   Potassium 3.2 (L) 3.5 - 5.1 mmol/L   Chloride 103 98 - 111 mmol/L   CO2 16 (L) 22 - 32 mmol/L   Glucose, Bld 209 (H) 70 - 99 mg/dL    Comment: Glucose reference range applies only to samples taken after fasting for at least 8 hours.   BUN 25 (H) 8 - 23 mg/dL   Creatinine, Ser 1.77 (H) 0.61 - 1.24 mg/dL   Calcium 8.1 (L) 8.9 - 10.3 mg/dL   GFR, Estimated 42 (L) >60 mL/min    Comment: (NOTE) Calculated using the CKD-EPI Creatinine Equation (2021)    Anion gap 21 (H) 5 - 15    Comment: Electrolytes repeated to confirm. Performed at [facility], [place] [address] [place], [place] [address]   CBC     Status: Abnormal   Collection Time: 10/21/22  1:01 AM  Result Value Ref Range   WBC 19.2 (H) 4.0 - 10.5 K/uL   RBC 4.65 4.22 - 5.81 MIL/uL   Hemoglobin 14.0 13.0 - 17.0 g/dL   HCT 40.7 39.0 - 52.0 %   MCV 87.5 80.0 - 100.0 fL   MCH 30.1 26.0 - 34.0 pg   MCHC 34.4 30.0 -  36.0 g/dL   RDW 15.7 (H) 11.5 - 15.5 %   Platelets 233 150 - 400 K/uL  nRBC 0.1 0.0 - 0.2 %    Comment: Performed at [facility], [place] [address] [place], [place] [address]  Lactic acid, plasma     Status: Abnormal   Collection Time: 10/21/22  1:01 AM  Result Value Ref Range   Lactic Acid, Venous 8.2 (HH) 0.5 - 1.9 mmol/L    Comment: CRITICAL RESULT CALLED TO, READ BACK BY AND VERIFIED WITH JAMIE POOTEN RN 10/21/22 0151 Wiliam Ke Performed at [facility], [place] [address] [place], [place] [address]   Culture, blood (Routine X 2) w Reflex to ID Panel     Status: None (Preliminary result)   Collection Time: 10/21/22  2:15 AM   Specimen: BLOOD RIGHT HAND  Result Value Ref Range   Specimen Description BLOOD RIGHT HAND    Special Requests      BOTTLES DRAWN AEROBIC AND ANAEROBIC Blood Culture adequate volume   Culture      NO GROWTH < 12 HOURS Performed at [facility], [place] [address], [place], [place] [address]    Report Status PENDING   Lactic acid, plasma     Status: Abnormal   Collection Time: 10/21/22  2:15 AM  Result Value Ref Range   Lactic Acid, Venous >9.0 (HH) 0.5 - 1.9 mmol/L    Comment: CRITICAL VALUE NOTED. VALUE IS CONSISTENT WITH PREVIOUSLY REPORTED/CALLED VALUE Performed at [facility], [place] [address], [place], [place] [address]   Culture, blood (Routine X 2) w Reflex to ID Panel     Status: None (Preliminary result)   Collection Time: 10/21/22  2:16 AM   Specimen: BLOOD RIGHT HAND  Result Value Ref Range   Specimen Description BLOOD RIGHT HAND    Special Requests      BOTTLES DRAWN AEROBIC AND ANAEROBIC Blood Culture results may not be optimal due to an inadequate volume of blood received in culture bottles   Culture      NO GROWTH < 12 HOURS Performed at [facility] [address], [place], [place] [address]    Report Status PENDING   Respiratory (~20 pathogens) panel by PCR     Status: None   Collection Time: 10/21/22   2:51 AM   Specimen: Nasopharyngeal Swab; Respiratory  Result Value Ref Range   Adenovirus NOT DETECTED NOT DETECTED   Coronavirus 229E NOT DETECTED NOT DETECTED    Comment: (NOTE) The Coronavirus on the Respiratory Panel, DOES NOT test for the novel  Coronavirus (2019 nCoV)    Coronavirus HKU1 NOT DETECTED NOT DETECTED   Coronavirus NL63 NOT DETECTED NOT DETECTED   Coronavirus OC43 NOT DETECTED NOT DETECTED   Metapneumovirus NOT DETECTED NOT DETECTED   Rhinovirus / Enterovirus NOT DETECTED NOT DETECTED   Influenza A NOT DETECTED NOT DETECTED   Influenza B NOT DETECTED NOT DETECTED   Parainfluenza Virus 1 NOT DETECTED NOT DETECTED   Parainfluenza Virus 2 NOT DETECTED NOT DETECTED   Parainfluenza Virus 3 NOT DETECTED NOT DETECTED   Parainfluenza Virus 4 NOT DETECTED NOT DETECTED   Respiratory Syncytial Virus NOT DETECTED NOT DETECTED   Bordetella pertussis NOT DETECTED NOT DETECTED   Bordetella Parapertussis NOT DETECTED NOT DETECTED   Chlamydophila pneumoniae NOT DETECTED NOT DETECTED   Mycoplasma pneumoniae NOT DETECTED NOT DETECTED    Comment: Performed at [facility], [place] [address], [place], [place] [address]  Glucose, capillary     Status: Abnormal   Collection Time: 10/21/22  3:11 AM  Result Value Ref Range   Glucose-Capillary 173 (H) 70 - 99 mg/dL    Comment: Glucose reference range applies only to samples taken after fasting for at least 8 hours.  MRSA Next Gen by PCR, Nasal     Status: None   Collection Time: 10/21/22  3:16 AM   Specimen: Nasal Mucosa; Nasal Swab  Result Value Ref Range   MRSA by PCR Next Gen NOT DETECTED NOT DETECTED    Comment: (NOTE) The GeneXpert MRSA Assay (FDA approved for NASAL specimens only), is one component of a comprehensive MRSA colonization surveillance program. It is not intended to diagnose MRSA infection nor to guide or monitor treatment for MRSA infections. Test performance is not FDA approved in patients less than 74  years old. Performed at [facility], [place] [address], [place], [place] [address]   I-STAT 7, (LYTES, BLD GAS, ICA, H+H)     Status: Abnormal   Collection Time: 10/21/22  4:57 AM  Result Value Ref Range   pH, Arterial 7.259 (L) 7.35 - 7.45   pCO2 arterial 27.7 (L) 32 - 48 mmHg   pO2, Arterial 103 83 - 108 mmHg   Bicarbonate 12.6 (L) 20.0 - 28.0 mmol/L   TCO2 14 (L) 22 - 32 mmol/L   O2 Saturation 98 %   Acid-base deficit 13.0 (H) 0.0 - 2.0 mmol/L   Sodium 142 135 - 145 mmol/L   Potassium 4.5 3.5 - 5.1 mmol/L   Calcium, Ion 0.85 (LL) 1.15 - 1.40 mmol/L   HCT 39.0 39.0 - 52.0 %   Hemoglobin 13.3 13.0 - 17.0 g/dL   Patient temperature 96.0 F    Sample type ARTERIAL    Comment VALUES EXPECTED, NO REPEAT   Basic metabolic panel     Status: Abnormal   Collection Time: 10/21/22  5:00 AM  Result Value Ref Range   Sodium 146 (H) 135 - 145 mmol/L   Potassium 4.5 3.5 - 5.1 mmol/L   Chloride 101 98 - 111 mmol/L   CO2 12 (L) 22 - 32 mmol/L   Glucose, Bld 139 (H) 70 - 99 mg/dL    Comment: Glucose reference range applies only to samples taken after fasting for at least 8 hours.   BUN 29 (H) 8 - 23 mg/dL   Creatinine, Ser 2.44 (H) 0.61 - 1.24 mg/dL   Calcium 7.6 (L) 8.9 - 10.3 mg/dL   GFR, Estimated 29 (L) >60 mL/min    Comment: (NOTE) Calculated using the CKD-EPI Creatinine Equation (2021)    Anion gap 33 (H) 5 - 15    Comment: Electrolytes repeated to confirm. Performed at [facility], [place] [address], [place], [place] [address]   Magnesium     Status: Abnormal   Collection Time: 10/21/22  5:00 AM  Result Value Ref Range   Magnesium 2.6 (H) 1.7 - 2.4 mg/dL    Comment: Performed at [facility] [address] [place], [place] [address]  C-reactive protein     Status: Abnormal   Collection Time: 10/21/22  5:00 AM  Result Value Ref Range   CRP 9.6 (H) <1.0 mg/dL    Comment: Performed at [facility] [address], [place], [place] [address]  Sedimentation  rate     Status: None   Collection Time: 10/21/22  5:00 AM  Result Value Ref Range   Sed Rate 12 0 - 16 mm/hr    Comment: Performed at [facility], [address] [place], [place] [address]  CBC     Status: Abnormal   Collection Time: 10/21/22  5:00 AM  Result Value Ref Range   WBC 19.9 (H) 4.0 - 10.5 K/uL   RBC 4.44 4.22 - 5.81 MIL/uL   Hemoglobin 13.4 13.0 - 17.0 g/dL   HCT 39.6 39.0 - 52.0 %   MCV 89.2 80.0 - 100.0 fL   MCH 30.2 26.0 - 34.0 pg   MCHC 33.8 30.0 - 36.0 g/dL   RDW 16.0 (H) 11.5 - 15.5 %   Platelets 193 150 - 400 K/uL   nRBC 0.4 (H) 0.0 - 0.2 %    Comment: Performed at [facility], [place] [address], [place], [place] [address]  Strep pneumoniae urinary antigen     Status: None   Collection Time: 10/21/22  5:15 AM  Result Value Ref Range   Strep Pneumo Urinary Antigen NEGATIVE NEGATIVE    Comment:        Infection due to S. pneumoniae cannot be absolutely ruled out since the antigen present may be below the detection limit of the test. Performed at [facility], [address], [place], [place] [address]   Urinalysis, Routine w reflex microscopic Urine, Catheterized     Status: Abnormal   Collection Time: 10/21/22  5:15 AM  Result Value Ref Range   Color, Urine YELLOW YELLOW   APPearance CLOUDY (A) CLEAR   Specific Gravity, Urine 1.020 1.005 - 1.030   pH 6.0 5.0 - 8.0   Glucose, UA NEGATIVE NEGATIVE mg/dL   Hgb urine dipstick MODERATE (A) NEGATIVE   Bilirubin Urine NEGATIVE NEGATIVE   Ketones, ur NEGATIVE NEGATIVE mg/dL   Protein, ur >300 (A) NEGATIVE mg/dL   Nitrite NEGATIVE NEGATIVE   Leukocytes,Ua NEGATIVE NEGATIVE    Comment: Performed at [facility] [address], [place], [place] [address]  Urinalysis, Microscopic (reflex)     Status: Abnormal   Collection Time: 10/21/22  5:15 AM  Result Value Ref Range   RBC / HPF 11-20 0 - 5 RBC/hpf   WBC, UA 0-5 0 - 5 WBC/hpf   Bacteria, UA FEW (A) NONE SEEN   Squamous Epithelial /  LPF 0-5 0 - 5   Amorphous Crystal PRESENT     Comment: Performed at [facility], [place] [address], [place], [place] [address]  Glucose, capillary     Status: Abnormal   Collection Time: 10/21/22  5:17 AM  Result Value Ref Range   Glucose-Capillary 130 (H) 70 - 99 mg/dL    Comment: Glucose reference range applies only to samples taken after fasting for at least 8 hours.  Cooxemetry Panel (carboxy, met, total hgb, O2 sat)     Status: Abnormal   Collection Time: 10/21/22  6:31 AM  Result Value Ref Range   Total hemoglobin 13.1 12.0 - 16.0 g/dL   O2 Saturation 38.6 %   Carboxyhemoglobin 0.3 (L) 0.5 - 1.5 %   Methemoglobin 1.3 0.0 - 1.5 %    Comment: Performed at [facility], [place] [address], [place], [place] [address]  Lactic acid, plasma     Status: Abnormal   Collection Time: 10/21/22  6:31 AM  Result Value Ref Range   Lactic Acid, Venous >9.0 (HH) 0.5 - 1.9 mmol/L    Comment: CRITICAL VALUE NOTED. VALUE IS CONSISTENT WITH PREVIOUSLY REPORTED/CALLED VALUE Performed at [facility], [place] [address], [place], [place] [address]   I-STAT  7, (LYTES, BLD GAS, ICA, H+H)     Status: Abnormal   Collection Time: 10/21/22  7:54 AM  Result Value Ref Range   pH, Arterial 7.175 (LL) 7.35 - 7.45   pCO2 arterial 30.5 (L) 32 - 48 mmHg   pO2, Arterial 307 (H) 83 - 108 mmHg   Bicarbonate 11.2 (L) 20.0 - 28.0 mmol/L   TCO2 12 (L) 22 - 32 mmol/L   O2 Saturation 100 %   Acid-base deficit 16.0 (H) 0.0 - 2.0 mmol/L   Sodium 141 135 - 145 mmol/L   Potassium 4.0 3.5 - 5.1 mmol/L   Calcium, Ion 0.85 (LL) 1.15 - 1.40 mmol/L   HCT 39.0 39.0 - 52.0 %   Hemoglobin 13.3 13.0 - 17.0 g/dL   Collection site art line    Drawn by RT    Sample type ARTERIAL    Comment NOTIFIED PHYSICIAN   POCT I-Stat EG7     Status: Abnormal   Collection Time: 10/21/22  9:00 AM  Result Value Ref Range   pH, Ven 7.215 (L) 7.25 - 7.43   pCO2, Ven 40.5 (L) 44 - 60 mmHg   pO2, Ven 44 32 - 45 mmHg   Bicarbonate  16.4 (L) 20.0 - 28.0 mmol/L   TCO2 18 (L) 22 - 32 mmol/L   O2 Saturation 71 %   Acid-base deficit 11.0 (H) 0.0 - 2.0 mmol/L   Sodium 144 135 - 145 mmol/L   Potassium 3.1 (L) 3.5 - 5.1 mmol/L   Calcium, Ion 0.82 (LL) 1.15 - 1.40 mmol/L   HCT 38.0 (L) 39.0 - 52.0 %   Hemoglobin 12.9 (L) 13.0 - 17.0 g/dL   Sample type VENOUS    Comment NOTIFIED PHYSICIAN   I-STAT 7, (LYTES, BLD GAS, ICA, H+H)     Status: Abnormal   Collection Time: 10/21/22  9:09 AM  Result Value Ref Range   pH, Arterial 7.188 (LL) 7.35 - 7.45   pCO2 arterial 40.3 32 - 48 mmHg   pO2, Arterial 53 (L) 83 - 108 mmHg   Bicarbonate 15.3 (L) 20.0 - 28.0 mmol/L   TCO2 17 (L) 22 - 32 mmol/L   O2 Saturation 78 %   Acid-base deficit 12.0 (H) 0.0 - 2.0 mmol/L   Sodium 143 135 - 145 mmol/L   Potassium 3.7 3.5 - 5.1 mmol/L   Calcium, Ion 0.81 (LL) 1.15 - 1.40 mmol/L   HCT 39.0 39.0 - 52.0 %   Hemoglobin 13.3 13.0 - 17.0 g/dL   Sample type ARTERIAL    Comment NOTIFIED PHYSICIAN   POCT I-Stat EG7     Status: Abnormal   Collection Time: 10/21/22  9:42 AM  Result Value Ref Range   pH, Ven 7.244 (L) 7.25 - 7.43   pCO2, Ven 38.9 (L) 44 - 60 mmHg   pO2, Ven 47 (H) 32 - 45 mmHg   Bicarbonate 16.8 (L) 20.0 - 28.0 mmol/L   TCO2 18 (L) 22 - 32 mmol/L   O2 Saturation 76 %   Acid-base deficit 10.0 (H) 0.0 - 2.0 mmol/L   Sodium 144 135 - 145 mmol/L   Potassium 3.2 (L) 3.5 - 5.1 mmol/L   Calcium, Ion 0.77 (LL) 1.15 - 1.40 mmol/L   HCT 37.0 (L) 39.0 - 52.0 %   Hemoglobin 12.6 (L) 13.0 - 17.0 g/dL   Sample type VENOUS    Comment NOTIFIED PHYSICIAN   I-STAT 7, (LYTES, BLD GAS, ICA, H+H)     Status: Abnormal  Collection Time: 10/21/22 10:04 AM  Result Value Ref Range   pH, Arterial 7.282 (L) 7.35 - 7.45   pCO2 arterial 27.4 (L) 32 - 48 mmHg   pO2, Arterial 145 (H) 83 - 108 mmHg   Bicarbonate 12.9 (L) 20.0 - 28.0 mmol/L   TCO2 14 (L) 22 - 32 mmol/L   O2 Saturation 99 %   Acid-base deficit 12.0 (H) 0.0 - 2.0 mmol/L   Sodium 147  (H) 135 - 145 mmol/L   Potassium 2.9 (L) 3.5 - 5.1 mmol/L   Calcium, Ion 0.69 (LL) 1.15 - 1.40 mmol/L   HCT 35.0 (L) 39.0 - 52.0 %   Hemoglobin 11.9 (L) 13.0 - 17.0 g/dL   Sample type ARTERIAL    Comment NOTIFIED PHYSICIAN     CARDIAC CATHETERIZATION  Result Date: 10/21/2022 Findings: On NE 8 and VP 0.03 Ao =114/74 (84) RA = 18 RV = 44/19 PA = 44/23 (31) PCW = 24 Fick cardiac output/index = 5.3/2.4 TD CO/CI =2.9/1.4 SVR = 1806 FA sat = 100% PA sat =68% 72% PAPi = 1.2 On NE 5 and milrinone 0.25 Findings: Ao = 114/76 (85) RA = 19 Fick cardiac output/index = 6.9/3.2 TD CO/CI = 3.6/1.7 SVR = 1402 FA sat = 99% PA sat = 76% Assessment: Cardiogenic shock. Continue supportive care. Glori Bickers, MD 11:16 AM Plan/Discussion: Assessment: Plan/Discussion:  DG CHEST PORT 1 VIEW  Result Date: 10/21/2022 CLINICAL DATA:  A 66 year old male presents for evaluation of hypoxia. EXAM: PORTABLE CHEST 1 VIEW COMPARISON:  October 20, 2022. FINDINGS: Endotracheal tube approximately 5.5 cm from the carina. Stable mild cardiac enlargement. Pacer defibrillator remaining in place. Gastric tube coursing through in off the field of the radiograph, side port below the expected location of the GE junction. EKG leads project over the chest. Small bilateral pleural effusions, increased interstitial prominence and bibasilar airspace disease with slight increase since previous imaging. Skeletal structures are unremarkable to the extent evaluated on limited evaluation IMPRESSION: 1. Small bilateral pleural effusions, increased interstitial prominence and bibasilar airspace disease with slight increase since previous imaging. 2. Stable mild cardiac enlargement. 3. Post intubation, tip of the endotracheal tube in the proximal to mid trachea between clavicular heads. Electronically Signed   By: Zetta Bills M.D.   On: 10/21/2022 07:46   DG CHEST PORT 1 VIEW  Result Date: 10/21/2022 CLINICAL DATA:  66 year old male presenting  for evaluation of the chest following intubation. EXAM: PORTABLE CHEST 1 VIEW COMPARISON:  Imaging from November 23rd and October 21, 2022. FINDINGS: EKG leads project over the chest. Pacer pads project over the chest. Dual lead pacer defibrillator remains in place. Post intubation, endotracheal tube tip approximately 4.5 cm from the carina tip just below clavicular heads. Gastric tube coursing into the upper abdomen, side port below GE junction. Cardiomediastinal contours are stable with persistent mild cardiac enlargement accentuated by AP projection. Unchanged appearance of basilar airspace disease and increased interstitial markings. Mild blunting of costodiaphragmatic sulci since previous imaging raising the question of very small bilateral pleural effusions. No visible pneumothorax. Skeletal structures are unremarkable to the extent evaluated on limited evaluation IMPRESSION: 1. Patient remains intubated with endotracheal tube tip just below the carina in the mid trachea. 2. Stable appearance of remaining support devices. 3. Stable appearance of diffuse interstitial and airspace disease greatest at the lung bases, again suspicious for infection perhaps aspiration related. Electronically Signed   By: Zetta Bills M.D.   On: 10/21/2022 07:42   CT Angio Chest PE  W and/or Wo Contrast  Result Date: 10/20/2022 CLINICAL DATA:  PE suspected, high probability EXAM: CT ANGIOGRAPHY CHEST WITH CONTRAST TECHNIQUE: Multidetector CT imaging of the chest was performed using the standard protocol during bolus administration of intravenous contrast. Multiplanar CT image reconstructions and MIPs were obtained to evaluate the vascular anatomy. RADIATION DOSE REDUCTION: This exam was performed according to the departmental dose-optimization program which includes automated exposure control, adjustment of the mA and/or kV according to patient size and/or use of iterative reconstruction technique. CONTRAST:  60m OMNIPAQUE  IOHEXOL 350 MG/ML SOLN COMPARISON:  02/25/2018 FINDINGS: Cardiovascular: Satisfactory opacification of the pulmonary arteries to the segmental level. No evidence of pulmonary embolism. Gross cardiomegaly. Left coronary artery calcifications. No pericardial effusion. Aortic atherosclerosis. Mediastinum/Nodes: Newly enlarged mediastinal lymph nodes, pretracheal nodes measuring up to 2.2 x 1.8 cm (series 6, image 138) thyroid gland, trachea, and esophagus demonstrate no significant findings. Lungs/Pleura: Unchanged small, chronic, loculated pleural effusions with associated atelectasis consolidation. New, heterogeneous and consolidative airspace opacity in the dependent bilateral lung bases (series 5, image 91). Additional interlobular septal thickening throughout the lungs. Fissural nodules or pleural fluid loculations along the right minor fissure are stable and definitively benign (series 7, image 61). Upper Abdomen: No acute abnormality. Musculoskeletal: No chest wall abnormality. No acute osseous findings. Review of the MIP images confirms the above findings. IMPRESSION: 1. Negative examination for pulmonary embolism. 2. Heterogeneous consolidative airspace opacity of the dependent bilateral lung bases, consistent with infection or aspiration. 3. Interlobular septal thickening throughout the lungs, consistent with pulmonary edema. 4. Unchanged small, chronic, loculated pleural effusions with associated atelectasis or consolidation. 5. Newly enlarged mediastinal lymph nodes, likely reactive. 6. Gross cardiomegaly and coronary artery disease. Aortic Atherosclerosis (ICD10-I70.0). Electronically Signed   By: ADelanna AhmadiM.D.   On: 10/20/2022 19:09   DG Chest Port 1 View  Result Date: 10/20/2022 CLINICAL DATA:  Chest pain. EXAM: PORTABLE CHEST 1 VIEW COMPARISON:  Chest radiograph dated May 28, 2018 FINDINGS: The heart is enlarged. Pacemaker leads in the right atrium and right ventricle. Bilateral lower lobe  hazy opacities, which may represent bilateral pleural effusions and/or mild edema. No acute osseous abnormality IMPRESSION: Cardiomegaly with bilateral lower lobe hazy opacities, which may represent bilateral pleural effusions and/or mild edema. Electronically Signed   By: IKeane PoliceD.O.   On: 10/20/2022 17:36    Review of Systems: Unable to obtain as patient is intubated Blood pressure (!) 82/52, pulse 60, temperature (!) 96 F (35.6 C), resp. rate (!) 28, height _0  (1.88 m), weight 89.8 kg, SpO2 97 %.  Physical Exam   Physical exam: General: Crtitically ill-appearing male, orally intubated  eyes closed, does not open, not following commands closed, does not open, not following commandsIHEENT: /AT, eyes anicteric.  ETT and OGT in place Neuro: Eyes closed, does not open, not following commands, Pupils 3 mm b/l reactive to light Chest: Coarse breath sounds, no wheezes or rhonchi Heart: Regular rate and rhythm, no murmurs or gallops Abdomen: Soft, nondistended, bowel sounds present Skin: No rash   Assessment/Plan: Acute hypoxic respiratory failure Possible aspiration pneumonia Mixed cardiogenic and septic shock Lactic acidosis Acute on chronic HFrEF Paroxysmal A-fib with RVR Hypokalemia/hypocalcemia hypernatremia  Continue protective ventilation Patient's oxygenation is improving, titrate FiO2 and PEEP with O2 sat goal 92% Follow-up cultures Continue vasopressor support with map goal 65 Advanced heart failure team is following Patient will probably have aortic balloon pump versus Impella Trend lactate Continue gentle diuresis Heart rate remain controlled  now Continue aggressive electrolyte supplement monitor  At this time as patient's vasopressor requirement is improving though on bedside ultrasonography his EF looked 15 to 20%, his FiO2 requirement on ventilator is coming down, at this time he is not a candidate for [facility] ECMO.  Will continue to follow and monitor the  progress   Total critical care time: 48 minutes  Performed by: [place] care time was exclusive of separately billable procedures and treating other patients.   Critical care was necessary to treat or prevent imminent or life-threatening deterioration.   Critical care was time spent personally by me on the following activities: development of treatment plan with patient and/or surrogate as well as nursing, discussions with consultants, evaluation of patient's response to treatment, examination of patient, obtaining history from patient or surrogate, ordering and performing treatments and interventions, ordering and review of laboratory studies, ordering and review of radiographic studies, pulse oximetry and re-evaluation of patient's condition.   Jacky Kindle, MD [facility] See Amion for pager If no response to pager, please call [phone] until 7pm After 7pm, Please call E-link [phone]  Time: 11:21 AM

## 2022-10-21 NOTE — Procedures (Signed)
Central Venous Catheter Insertion Procedure Note  Brendan Ellis  [id number]  1956/03/30  Date:10/21/22  Time:5:07 AM   Provider Performing:[name] W [facility]   Procedure: Insertion of Non-tunneled Central Venous Catheter(36556) with US guidance (09470)   Indication(s) Medication administration and Difficult access  Consent Unable to obtain consent due to emergent nature of procedure.  Anesthesia None used: cardiac arrest  Timeout Verified patient identification, verified procedure, site/side was marked, verified correct patient position, special equipment/implants available, medications/allergies/relevant history reviewed, required imaging and test results available.  Sterile Technique Maximal sterile technique was not able to be achieved due to emergent nature of procedure.  Procedure Description Area of catheter insertion was cleaned with chlorhexidine and draped in sterile fashion.  With real-time ultrasound guidance a central venous catheter was placed into the right femoral vein. Nonpulsatile blood flow and easy flushing noted in all ports.  The catheter was sutured in place and sterile dressing applied.  Complications/Tolerance None; patient tolerated the procedure well. Chest X-ray is ordered to verify placement for internal jugular or subclavian cannulation.   Chest x-ray is not ordered for femoral cannulation.  EBL Minimal  Specimen(s) None   Georgann Housekeeper, AGACNP-BC [facility]  See Amion for personal pager PCCM on call pager [phone] until 7pm. Please call Elink 7p-7a. [phone]  10/21/2022 5:08 AM

## 2022-10-21 NOTE — Consult Note (Addendum)
NAME:  Brendan Ellis, MRN:  [id number], DOB:  1956-02-16, LOS: 1 ADMISSION DATE:  10/20/2022, CONSULTATION DATE:  10/21/2022 REFERRING MD:  Dr. Posey Pronto, [facility], CHIEF COMPLAINT:  Short of breath   History of Present Illness:  66 yo male former smoker developed cough, sore throat and dyspnea about 1 week prior to admission.  Started on omnicef by his PCP, but symptoms progressed.  He presented to [facility] on 10/20/22.  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 for chemo induced CM and a fib.  He had A fib with RVR in the ER and started on amiodarone.  CT angio chest 2.2 x 1.8 cm pretracheal LN, small chronic loculated effusions, consolidative ASD in lower lobes, and interlobular septal thickening.  Started on IV ABx, diuretics and supplemental oxygen.  He was tried on Bipap but wasn't able to tolerate this.  PCCM consulted to arrange for management in ICU.  Pertinent  Medical History  Non ischemic dilated CM with HFrEF, s/p AICD, PAF on eliquis, Leukemia 2019, HTN  Significant Hospital Events: Including procedures, antibiotic start and stop dates in addition to other pertinent events   11/23 Admit 11/24 Transfer to ICU  Interim History / Subjective:  He felt smothered by Bipap.  He wants to try other current therapies a little longer before deciding about need for intubation.  Objective   Blood pressure 114/82, pulse (!) 122, temperature (!) 96.9 F (36.1 C), temperature source Oral, resp. rate (!) 32, height _0  (1.88 m), weight 88 kg, SpO2 96 %.    Vent Mode: BIPAP;PCV FiO2 (%):  [0 %-100 %] 0 % Set Rate:  [8 bmp] 8 bmp PEEP:  [6 cmH20] 6 cmH20   Intake/Output Summary (Last 24 hours) at 10/21/2022 0200 Last data filed at 10/20/2022 2140 Gross per 24 hour  Intake 144.17 ml  Output 340 ml  Net -195.83 ml   Filed Weights   10/20/22 2327  Weight: 88 kg    Examination:  General - alert, using accessory muscles, pausing to catch his breath between sentences Eyes - pupils reactive ENT  - no sinus tenderness, no stridor Cardiac - irregular, tachycardic Chest - bilateral crackles Abdomen - soft, non tender, + bowel sounds Extremities - no cyanosis, clubbing, or edema Skin - no rashes Neuro - normal strength, moves extremities, follows commands Psych - normal mood and behavior  Resolved Hospital Problem list     Assessment & Plan:   Acute hypoxic respiratory failure with pulmonary infiltrates. - continue ABx - check respiratory viral panel, UDS - check ESR and add solumedrol - continue diuresis - goal SpO2 > 92% - unable to tolerate Bipap - will reassess his status once he arrives to ICU >> concerned he will need intubation  A fib with RVR. Non ischemic CM with acute on chronic HFrEF. - continue lasix - continue eliquis - continue amiodarone gtt - f/u Echo  Anion gap acidosis with lactic acidosis. - f/u ABG  Hypokalemia. - f/u BMET  Hyperglycemia. - SSI  Best Practice (right click and "Reselect all SmartList Selections" daily)   Diet/type: NPO DVT prophylaxis: DOAC GI prophylaxis: N/A Lines: N/A Foley:  N/A Code Status:  full code Last date of multidisciplinary goals of care discussion [updated his wife at bedside]  Labs   CBC: Recent Labs  Lab 10/20/22 1715 10/20/22 1957 10/21/22 0101  WBC 10.6*  --  19.2*  NEUTROABS 7.7  --   --   HGB 13.1 14.3 14.0  HCT 37.5* 42.0 40.7  MCV 85.2  --  87.5  PLT 236  --  165    Basic Metabolic Panel: Recent Labs  Lab 10/20/22 1715 10/20/22 1957  NA 138 138  K 2.8* 3.1*  CL 105  --   CO2 24  --   GLUCOSE 177*  --   BUN 23  --   CREATININE 0.64  --   CALCIUM 7.7*  --   MG 1.4*  --    GFR: Estimated Creatinine Clearance: 108.5 mL/min (by C-G formula based on SCr of 0.64 mg/dL). Recent Labs  Lab 10/20/22 1715 10/21/22 0101  WBC 10.6* 19.2*  LATICACIDVEN  --  8.2*    Liver Function Tests: Recent Labs  Lab 10/20/22 1715  AST 53*  ALT 59*  ALKPHOS 32*  BILITOT 1.6*  PROT 7.1   ALBUMIN 3.2*   No results for input(s): "LIPASE", "AMYLASE" in the last 168 hours. No results for input(s): "AMMONIA" in the last 168 hours.  ABG    Component Value Date/Time   HCO3 25.1 10/20/2022 1957   TCO2 26 10/20/2022 1957   O2SAT 65 10/20/2022 1957     Coagulation Profile: No results for input(s): "INR", "PROTIME" in the last 168 hours.  Cardiac Enzymes: No results for input(s): "CKTOTAL", "CKMB", "CKMBINDEX", "TROPONINI" in the last 168 hours.  HbA1C: Hgb A1c MFr Bld  Date/Time Value Ref Range Status  12/18/2020 10:03 PM >15.5 (H) 4.8 - 5.6 % Final    Comment:    (NOTE) **Verified by repeat analysis**         Prediabetes: 5.7 - 6.4         Diabetes: >6.4         Glycemic control for adults with diabetes: <7.0     CBG: No results for input(s): "GLUCAP" in the last 168 hours.  Review of Systems:   Reviewed and negative  Past Medical History:  He,  has a past medical history of Hypertension, Leukemia ([place]), and PAF (paroxysmal atrial fibrillation) ([place]) (04/27/2018).   Surgical History:  History reviewed. No pertinent surgical history.   Social History:   reports that he has never smoked. He has never used smokeless tobacco. He reports that he does not drink alcohol and does not use drugs.   Family History:  His family history includes Diabetes in his mother; Hypertension in his mother.   Allergies No Known Allergies   Home Medications  Prior to Admission medications   Medication Sig Start Date End Date Taking? Authorizing Provider  apixaban (ELIQUIS) 5 MG TABS tablet Take 5 mg by mouth 2 (two) times daily. 08/03/22  Yes [provider]     Critical care time: 41 minutes  Chesley Mires, MD [place] Pager - [phone] 10/21/2022, 2:16 AM

## 2022-10-21 NOTE — Progress Notes (Signed)
Transported pt to and from 2H14 to CT2 on vent. Pt stable throughout with no complications. VS WNL.

## 2022-10-21 NOTE — Assessment & Plan Note (Signed)
Oral and IV supplement given in ED.  Repeat labs in AM.

## 2022-10-21 NOTE — Consult Note (Addendum)
Heart Failure Consultation   Patient ID: Brendan Ellis MRN: [id number]; DOB: [birth year]  Admit date: 10/20/2022 Date of Consult: 10/21/2022  PCP:  Robert Bellow, PA-C   Brendan Ellis Cardiologist:  Mahala Menghini, MD        Patient Profile:   Brendan Ellis is a 66 y.o. 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 Broadus John.  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   According to his daughter, he also has carvedilol at some point in the past, however I do not see carvedilol listed on the most recent medication list by his primary cardiologist.  He is on lisinopril 10 mg daily.  He was last seen by Dr. Salley Scarlet of [facility] on 08/03/2022 after his ICD interrogation revealed atrial flutter.  He was started on Eliquis at the time.  I am unable to see the raw EKG tracing, however per report, he was in sinus rhythm at the time.  Family thinks he may have taken himself off of the blood pressure medication including carvedilol.  It is not clear to me whether or not he was still taking Eliquis.    He had increasing dyspnea on exertion for the past 2 weeks.  Prior to 2 weeks ago, he was very active and that he denied any significant chest discomfort or palpitation.  He eventually sought medical attention at [facility] on 10/20/2022.  Blood work on initial arrival showed potassium of 2.8, creatinine of 0.64, BNP 1300.  White blood cell count of 10.6.  Viral panel negative for influenza and COVID.  Chest x-ray showed cardiomegaly with bilateral lower lobe hazy  opacities which likely represent bilateral pleural effusion with edema.  Initial EKG showed atrial flutter with RVR with heart rate in the 150s with ST downsloping and T wave inversion in the lateral leads.  CTA showed consolidative airspace disease in the dependent bilateral lung bases consistent with infection or aspiration, there was also finding of pulmonary edema and a loculated pleural effusion, newly enlarged mediastinal lymph node likely reactive.  Patient was given a total of 80 mg IV diuretic.  Creatinine trended up to 1.7 early this morning.  He was also treated with IV Lopressor given elevated heart rate and Solu-Medrol for breathing.  Around 4:30 AM this morning, patient went into PEA arrest and required 6 minutes of CPR and epi before ROSC.  No shock was delivered.    Past Medical History:  Diagnosis Date   Hypertension    Leukemia ([place])    PAF (paroxysmal atrial fibrillation) ([place]) 04/27/2018    History reviewed. No pertinent surgical history.   Home Medications:  Prior to Admission medications   Medication Sig Start Date End Date Taking? Authorizing Provider  apixaban (ELIQUIS) 5 MG TABS tablet Take 5 mg by mouth 2 (two) times daily. 08/03/22  Yes [provider]    Inpatient Medications: Scheduled Meds:  [MAR Hold] docusate  100 mg Per Tube BID   [MAR Hold] insulin aspart  0-15 Units Subcutaneous Q4H   [MAR Hold] methylPREDNISolone (SOLU-MEDROL) injection  120 mg Intravenous Q24H   [MAR Hold] mouth rinse  15 mL Mouth Rinse Q2H   [MAR Hold] pantoprazole (PROTONIX) IV  40 mg Intravenous Daily   phenylephrine       [MAR Hold] polyethylene glycol  17 g Per Tube Daily   [MAR Hold] potassium chloride  20 mEq Oral BID   [MAR Hold] sodium bicarbonate  100 mEq Intravenous Once   [MAR Hold] sodium chloride flush  3 mL Intravenous Q12H   Continuous Infusions:  [MAR Hold] sodium chloride     [MAR Hold] azithromycin Stopped (10/21/22 0420)   [MAR Hold] cefTRIAXone  (ROCEPHIN)  IV     [MAR Hold] epinephrine 25 mcg/min (10/21/22 0630)   [MAR Hold] norepinephrine (LEVOPHED) Adult infusion 40 mcg/min (10/21/22 0630)   sodium bicarbonate 150 mEq in dextrose 5 % 1,150 mL infusion 100 mL/hr at 10/21/22 0630   vasopressin 0.03 Units/min (10/21/22 0630)   PRN Meds: [HBZ Hold] sodium chloride, [MAR Hold] acetaminophen **OR** [MAR Hold] acetaminophen, [MAR Hold] fentaNYL (SUBLIMAZE) injection, [MAR Hold] midazolam, [MAR Hold] ondansetron **OR** [MAR Hold] ondansetron (ZOFRAN) IV, [MAR Hold] mouth rinse, phenylephrine, [MAR Hold] senna-docusate  Allergies:   No Known Allergies  Social History:   Social History   Socioeconomic History   Marital status: Single    Spouse name: Not on file   Number of children: Not on file   Years of education: Not on file   Highest education level: Not on file  Occupational History   Not on file  Tobacco Use   Smoking status: Never   Smokeless tobacco: Never  Substance and Sexual Activity   Alcohol use: Never   Drug use: Never   Sexual activity: Not on file  Other Topics Concern   Not on file  Social History Narrative   Not on file   Social Determinants of Health   Financial Resource Strain: Not on file  Food Insecurity: No Food Insecurity (10/21/2022)   Hunger Vital Sign    Worried About Running Out of Food in the Last Year: Never true    Ran Out of Food in the Last Year: Never true  Transportation Needs: No Transportation Needs (10/21/2022)   PRAPARE - Hydrologist (Medical): No    Lack of Transportation (Non-Medical): No  Physical Activity: Not on file  Stress: Not on file  Social Connections: Not on file  Intimate Partner Violence: Not At Risk (10/21/2022)   Humiliation, Afraid, Rape, and Kick questionnaire    Fear of Current or Ex-Partner: No    Emotionally Abused: No    Physically Abused: No    Sexually Abused: No    Family History:    Family History  Problem Relation  Age of Onset   Hypertension Mother    Diabetes Mother      ROS:  Please see the history of present illness.   All other ROS reviewed and negative.     Physical Exam/Data:   Vitals:   10/21/22 0645 10/21/22 0650 10/21/22 0700 10/21/22 0710  BP:   109/80   Pulse:   60 (!) 55  Resp: (!) 28 (!) 28 (!) 25 (!) 28  Temp: (!) 96 F (35.6 C)     TempSrc:      SpO2:   95% 95%  Weight:      Height:        Intake/Output Summary (Last 24 hours) at 10/21/2022 0803 Last data filed at 10/21/2022 0630 Gross per 24 hour  Intake 877.65 ml  Output 370 ml  Net 507.65 ml      10/21/2022    3:06 AM 10/20/2022   11:27 PM 12/20/2020    1:00 AM  Last 3 Weights  Weight (lbs) 197 lb 15.6 oz 194 lb 211 lb 3.2 oz  Weight (kg) 89.8 kg 87.998 kg 95.8 kg     Body mass index is 25.42 kg/m.  General: Intubated, on ventilator HEENT: normal Neck: no JVD Vascular: No carotid bruits; Distal pulses 2+ bilaterally Cardiac:  normal S1, S2; RRR; no murmur  Lungs:  clear to auscultation bilaterally, no wheezing, rhonchi or rales  Abd: soft, nontender, no hepatomegaly  Ext: no edema Musculoskeletal:  No deformities, BUE and BLE strength normal and equal Skin: warm and dry  Neuro:  CNs 2-12 intact, no focal abnormalities noted Psych:  Normal affect   EKG:  The EKG was personally reviewed and demonstrates: Atrial flutter with RVR, downsloping ST and T wave inversion in the lateral leads Telemetry:  Telemetry was personally reviewed and demonstrates: Patient arrived with atrial flutter with RVR, went into PEA arrest, after PEA arrest, patient was seen normal sinus rhythm with heart rate in the 80s.  Relevant CV Studies:  Limited echo 06/27/2019 Summary   1. Limited study to evaluate ejection fraction.    2. Moderately to severely decreased left ventricular systolic function,  ejection fraction 35%.    Left Ventricle    The left ventricular systolic function is moderately to severely decreased,   LVEF is visually estimated at 35%.   Laboratory Data:  High Sensitivity Troponin:   Recent Labs  Lab 10/20/22 1715 10/20/22 1932  TROPONINIHS 6 6     Chemistry Recent Labs  Lab 10/20/22 1715 10/20/22 1957 10/21/22 0101 10/21/22 0457 10/21/22 0500 10/21/22 0754  NA 138   < > 140 142 146* 141  K 2.8*   < > 3.2* 4.5 4.5 4.0  CL 105  --  103  --  101  --   CO2 24  --  16*  --  12*  --   GLUCOSE 177*  --  209*  --  139*  --   BUN 23  --  25*  --  29*  --   CREATININE 0.64  --  1.77*  --  2.44*  --   CALCIUM 7.7*  --  8.1*  --  7.6*  --   MG 1.4*  --  2.3  --  2.6*  --   GFRNONAA >60  --  42*  --  29*  --   ANIONGAP 9  --  21*  --  33*  --    < > = values in this interval not displayed.    Recent Labs  Lab 10/20/22 1715  PROT 7.1  ALBUMIN 3.2*  AST 53*  ALT 59*  ALKPHOS 32*  BILITOT 1.6*   Lipids No results for input(s): "CHOL", "TRIG", "HDL", "LABVLDL", "LDLCALC", "CHOLHDL" in the last 168 hours.  Hematology Recent Labs  Lab 10/20/22 1715 10/20/22 1957 10/21/22 0101 10/21/22 0457 10/21/22 0500 10/21/22 0754  WBC 10.6*  --  19.2*  --  19.9*  --   RBC 4.40  --  4.65  --  4.44  --   HGB 13.1   < > 14.0 13.3 13.4 13.3  HCT 37.5*   < > 40.7 39.0 39.6 39.0  MCV 85.2  --  87.5  --  89.2  --   MCH 29.8  --  30.1  --  30.2  --   MCHC 34.9  --  34.4  --  33.8  --   RDW 15.4  --  15.7*  --  16.0*  --   PLT 236  --  233  --  193  --    < > = values in this interval not displayed.   Thyroid No results for input(s): "TSH", "FREET4" in the last 168 hours.  BNP Recent Labs  Lab 10/20/22 1715  BNP 1,300.4*    DDimer No results for input(s): "DDIMER" in the last 168 hours.   Radiology/Studies:  DG CHEST PORT 1 VIEW  Result Date: 10/21/2022 CLINICAL DATA:  A 66 year old male presents for evaluation of hypoxia. EXAM: PORTABLE CHEST 1 VIEW COMPARISON:  October 20, 2022. FINDINGS: Endotracheal tube approximately 5.5 cm from the carina. Stable mild cardiac  enlargement. Pacer defibrillator remaining in place. Gastric tube coursing through in off the field of the radiograph, side port below the expected location of the GE junction. EKG leads project over the chest. Small bilateral pleural effusions, increased interstitial prominence and bibasilar airspace disease with slight increase since previous imaging. Skeletal structures are unremarkable to the extent evaluated on limited evaluation IMPRESSION: 1. Small bilateral pleural effusions, increased interstitial prominence and bibasilar airspace disease with slight increase since previous imaging. 2. Stable mild cardiac enlargement. 3. Post intubation, tip of the endotracheal tube in the proximal to mid trachea between clavicular heads. Electronically Signed   By: Zetta Bills M.D.   On: 10/21/2022 07:46   DG CHEST PORT 1 VIEW  Result Date: 10/21/2022 CLINICAL DATA:  66 year old male presenting for evaluation of the chest following intubation. EXAM: PORTABLE CHEST 1 VIEW COMPARISON:  Imaging from November 23rd and October 21, 2022. FINDINGS: EKG leads project over the chest. Pacer pads project over the chest. Dual lead pacer defibrillator remains in place. Post intubation, endotracheal tube tip approximately 4.5 cm from the carina tip just below clavicular heads. Gastric tube coursing into the upper abdomen, side port below GE junction. Cardiomediastinal contours are stable with persistent mild cardiac enlargement accentuated by AP projection. Unchanged appearance of basilar airspace disease and increased interstitial markings. Mild blunting of costodiaphragmatic sulci since previous imaging raising the question of very small bilateral pleural effusions. No visible pneumothorax. Skeletal structures are unremarkable to the extent evaluated on limited evaluation IMPRESSION: 1. Patient remains intubated with endotracheal tube tip just below the carina in the mid trachea. 2. Stable appearance of remaining support  devices. 3. Stable appearance of diffuse interstitial and airspace disease greatest at the lung bases, again suspicious for infection perhaps aspiration related. Electronically Signed   By: Zetta Bills M.D.   On: 10/21/2022 07:42   CT Angio Chest PE W and/or Wo Contrast  Result Date: 10/20/2022 CLINICAL DATA:  PE suspected, high probability EXAM: CT ANGIOGRAPHY CHEST WITH CONTRAST TECHNIQUE: Multidetector CT imaging of the chest was performed using the standard protocol during bolus administration of intravenous contrast. Multiplanar CT image reconstructions and MIPs were obtained to evaluate the vascular anatomy. RADIATION DOSE REDUCTION: This exam was performed according to the departmental dose-optimization program which includes automated exposure control, adjustment of the mA and/or kV according to patient size and/or use of iterative reconstruction technique. CONTRAST:  16m OMNIPAQUE IOHEXOL 350 MG/ML SOLN COMPARISON:  02/25/2018 FINDINGS: Cardiovascular: Satisfactory opacification of the pulmonary arteries to the segmental level. No evidence of pulmonary embolism. Gross cardiomegaly. Left coronary artery calcifications. No pericardial effusion. Aortic atherosclerosis. Mediastinum/Nodes: Newly enlarged mediastinal  lymph nodes, pretracheal nodes measuring up to 2.2 x 1.8 cm (series 6, image 138) thyroid gland, trachea, and esophagus demonstrate no significant findings. Lungs/Pleura: Unchanged small, chronic, loculated pleural effusions with associated atelectasis consolidation. New, heterogeneous and consolidative airspace opacity in the dependent bilateral lung bases (series 5, image 91). Additional interlobular septal thickening throughout the lungs. Fissural nodules or pleural fluid loculations along the right minor fissure are stable and definitively benign (series 7, image 61). Upper Abdomen: No acute abnormality. Musculoskeletal: No chest wall abnormality. No acute osseous findings. Review of the  MIP images confirms the above findings. IMPRESSION: 1. Negative examination for pulmonary embolism. 2. Heterogeneous consolidative airspace opacity of the dependent bilateral lung bases, consistent with infection or aspiration. 3. Interlobular septal thickening throughout the lungs, consistent with pulmonary edema. 4. Unchanged small, chronic, loculated pleural effusions with associated atelectasis or consolidation. 5. Newly enlarged mediastinal lymph nodes, likely reactive. 6. Gross cardiomegaly and coronary artery disease. Aortic Atherosclerosis (ICD10-I70.0). Electronically Signed   By: Delanna Ahmadi M.D.   On: 10/20/2022 19:09   DG Chest Port 1 View  Result Date: 10/20/2022 CLINICAL DATA:  Chest pain. EXAM: PORTABLE CHEST 1 VIEW COMPARISON:  Chest radiograph dated May 28, 2018 FINDINGS: The heart is enlarged. Pacemaker leads in the right atrium and right ventricle. Bilateral lower lobe hazy opacities, which may represent bilateral pleural effusions and/or mild edema. No acute osseous abnormality IMPRESSION: Cardiomegaly with bilateral lower lobe hazy opacities, which may represent bilateral pleural effusions and/or mild edema. Electronically Signed   By: Keane Police D.O.   On: 10/20/2022 17:36     Assessment and Plan:   Acute respiratory failure/pulmonary edema: CTA concerning acute heart failure, however cannot rule out possible infection/aspiration either, currently on antibiotic.  Suspect heart failure driven by atrial flutter with RVR of unknown duration.  Pending device interrogation to figure out the duration of atrial flutter.  Patient received a total of 80 mg of IV Lasix last night.  Plans Swan-Ganz catheter placement.  PEA arrest/shock: Likely related to respiratory failure.  On vasopressin and norepi.  No longer on epinephrine.  Amiodarone weaned off.  Atrial flutter with RVR: Patient arrived with atrial flutter with RVR with heart rate in the 150s, unknown duration.  Will obtain ICD  interrogation to figure out the duration of atrial flutter prior to arrival.  After PEA arrest, patient was placed on amiodarone for short period of time.  He has converted back to normal sinus rhythm.  History of nonischemic cardiomyopathy: Felt to be chemo induced.  Pending echocardiogram to reassess the ejection fraction.  Last EF in 2020 was 35%.  Per the daughter, she suspects the patient has taken himself off of all heart failure medications prior to hospitalization  HFrEF s/p Biotronik dual-chamber PPM: Pending device interrogation.  AKI: Hold off on further diuretic until we can get a Swan catheter in.  Received 80 mg of IV Lasix last night prior to midnight, creatinine trended up from 0.6-1.7 early this morning.  Creatinine further deteriorated to 2.4 after arrest.    Risk Assessment/Risk Scores:        New York Heart Association (NYHA) Functional Class NYHA Class III  CHA2DS2-VASc Score = 1   This indicates a 0.6% annual risk of stroke. The patient's score is based upon: CHF History: 1 HTN History: 0 Diabetes History: 0 Stroke History: 0 Vascular Disease History: 0 Age Score: 0 Gender Score: 0         For questions or updates,  please contact [place] Please consult [email or web address] for contact info under    Hilbert Corrigan, [place]  10/21/2022 8:03 AM  Agree with above.   66 y/o male with h/o systolic HF due to NICM EF 35%, AML s/p chemo 2019 (in remission), PAFL whom we are asked to see due to shock and PEA arrest.   Has been feeling bad for several weeks. Presented to ER in shock with AFL at 150. Eventual PEA arrest with 6 mins CPR. Now intubated. Back in NSR. Lactate > 9 Scr 1.0 -> 2.44  CT chest with bilateral LL PNA. Sats ok on vent.   Now intubated/sedated. BP improved on NE 10 and VP 0.02 Epi and DBA off  Bedside echo done personally  EF ~20% RV moderately down   General:  Intubated/sedated HEENT: normal Neck: supple. JVP to jaw  Carotids 2+  bilat; no bruits. No lymphadenopathy or thryomegaly appreciated. Cor: PMI nondisplaced. Regular rate & rhythm.+s3 Lungs: clear Abdomen: soft, nontender, nondistended. No hepatosplenomegaly. No bruits or masses. Good bowel sounds. Extremities: no cyanosis, clubbing, rash, edema Neuro: intubated/sedated   Profound cardiogenic shock likely due to tachy-CM on top of pre-existing CM. Doubt he needs ECMO at this time as oxygenation improving and pressor requirements coming down. Will take to cath lab for swan and potential mechanical support. Start amio to help maintain NSR.   CRITICAL CARE Performed by: Glori Bickers  Total critical care time: 65 minutes  Critical care time was exclusive of separately billable procedures and treating other patients.  Critical care was necessary to treat or prevent imminent or life-threatening deterioration.  Critical care was time spent personally by me (independent of midlevel Ellis or residents) on the following activities: development of treatment plan with patient and/or surrogate as well as nursing, discussions with consultants, evaluation of patient's response to treatment, examination of patient, obtaining history from patient or surrogate, ordering and performing treatments and interventions, ordering and review of laboratory studies, ordering and review of radiographic studies, pulse oximetry and re-evaluation of patient's condition.  Glori Bickers, MD  8:19 AM

## 2022-10-21 NOTE — Progress Notes (Signed)
eLink Physician-Brief Progress Note Patient Name: Brendan Ellis DOB: 10-12-1956 MRN: [id number]   Date of Service  10/21/2022  HPI/Events of Note  60 M hx of chemo induced CM s/p AICD, afib on apixaban, leukemia presented with shortness of breath, cough and sore thoat. CTA with 2 cm pre-tracheal LN as well as consolidation and small loculated effusion which seems chronic.H was in afib RVR.  eICU Interventions  Unable tolerate BiPap, seen comfortable on heated HF Antibiotics for pneumonia Amiodarone for afib RVR Repeat electrolytes after K correction     Intervention Category Evaluation Type: New Patient Evaluation  Brendan Ellis 10/21/2022, 3:59 AM

## 2022-10-21 NOTE — Progress Notes (Addendum)
[place] Progress Note Patient Name: Brendan Ellis DOB: 1956/03/16 MRN: [id number]   Date of Service  10/21/2022  HPI/Events of Note  Called back in as patient went into respiratory distress and is very restless. Also with episodes of staring.  Spoke with wife who was at bedside and they are amenable for intubation.  eICU Interventions  Bedside CCM team informed and are on their way.     Intervention Category Intermediate Interventions: Respiratory distress - evaluation and management  Judd Lien 10/21/2022, 4:20 AM

## 2022-10-21 NOTE — Progress Notes (Signed)
Received bedside report from Cipriano Mile, RN.  Drs Haroldine Laws and Chand at bedside with family present.    Norepi weaned outside of ordered parameters based on verbal direction from Dr. Tacy Learn.  Consents obtained and pt prepared for urgent RHC.  Handoff given to Nicki Reaper, RN and [place], EMT-P from cath lab.

## 2022-10-21 NOTE — Assessment & Plan Note (Addendum)
Arrived on 15 L [facility]NC from [facility] ED in respiratory distress.  Placed on BiPAP with improvement in respiratory status.  Repeat CBC shows WBC up to 19.2.  Question superimposed pneumonia in addition to pulmonary edema. -Continue IV Lasix 60 mg twice daily -Start on empiric IV ceftriaxone and azithromycin -Obtain blood cultures -ABG and repeat CXR in a.m. -Patient removed BiPAP, continues to have increased work of breathing -Appreciate assistance of PCCM who have evaluated and transferring patient to ICU

## 2022-10-21 NOTE — Progress Notes (Signed)
Yellow colored ring with clear stones given to pt's wife.

## 2022-10-21 NOTE — Progress Notes (Addendum)
66 year old male who was admitted with acute hypoxic respiratory failure in the setting of combination of aspiration pneumonia and pulmonary edema, course was complicated with PEA cardiac arrest, ROSC was achieved after 7 minutes of CPR  Patient had echocardiogram done which showed EF 15 to 20%   He underwent right heart cath and IABP placement, tolerated well  About 2 hours ago patient started with some myoclonic jerks, he was given 2 mg of IV Ativan.  After 15 minutes patient had generalized tonic-clonic seizure, he was given another dose of IV Ativan and then loaded with Keppra 4.5 g.  Post seizure he became more hypotensive, he was on Levophed, his requirement increased to 20 mics, vasopressin was started  He had third seizure now, generalized tonic-clonic he received Ativan and was started on propofol infusion  Patient is going for CT head instead to rule out acute abnormalities  Spoke with neurology, continuous EEG is ordered.  Neurology will come and see the patient   Addendum: CT head showed no acute intracranial abnormalities    Additional critical care time: 33 minutes  Performed by: [place] care time was exclusive of separately billable procedures and treating other patients.   Critical care was necessary to treat or prevent imminent or life-threatening deterioration.   Critical care was time spent personally by me on the following activities: development of treatment plan with patient and/or surrogate as well as nursing, discussions with consultants, evaluation of patient's response to treatment, examination of patient, obtaining history from patient or surrogate, ordering and performing treatments and interventions, ordering and review of laboratory studies, ordering and review of radiographic studies, pulse oximetry and re-evaluation of patient's condition.   Jacky Kindle, MD [place] [facility] See Amion for pager If no response to pager,  please call [phone] until 7pm After 7pm, Please call E-link [phone]

## 2022-10-21 NOTE — Progress Notes (Signed)
  Echocardiogram 2D Echocardiogram has been performed.  Brendan Ellis M 10/21/2022, 2:04 PM

## 2022-10-21 NOTE — Procedures (Signed)
Intubation Procedure Note  Greig Altergott  [id number]  22-Nov-1956  Date:10/21/22  Time:5:09 AM   Provider Performing:Shakur Lembo W [facility]    Procedure: Intubation (58592)  Indication(s) Respiratory Failure  Consent Risks of the procedure as well as the alternatives and risks of each were explained to the patient and/or caregiver.  Consent for the procedure was obtained and is signed in the bedside chart   Anesthesia Etomidate, Versed, Fentanyl, and Rocuronium   Time Out Verified patient identification, verified procedure, site/side was marked, verified correct patient position, special equipment/implants available, medications/allergies/relevant history reviewed, required imaging and test results available.   Sterile Technique Usual hand hygeine, masks, and gloves were used   Procedure Description Patient positioned in bed supine.  Sedation given as noted above.  Patient was intubated with endotracheal tube using Glidescope.  View was Grade 1 full glottis .  Number of attempts was 1.  Colorimetric CO2 detector was consistent with tracheal placement.   Complications/Tolerance None; patient tolerated the procedure well. Chest X-ray is ordered to verify placement.   EBL Minimal   Specimen(s) None   Georgann Housekeeper, AGACNP-BC [facility]  See Amion for personal pager PCCM on call pager [phone] until 7pm. Please call Elink 7p-7a. [phone]  10/21/2022 5:09 AM

## 2022-10-21 NOTE — Interval H&P Note (Signed)
History and Physical Interval Note:  10/21/2022 8:35 AM  Brendan Ellis  has presented today for surgery, with the diagnosis of hf.  The various methods of treatment have been discussed with the patient and family. After consideration of risks, benefits and other options for treatment, the patient has consented to  Procedure(s): RIGHT HEART CATH (N/A) and possible mechanical circulatory support as a surgical intervention.  The patient's history has been reviewed, patient examined, no change in status, stable for surgery.  I have reviewed the patient's chart and labs.  Questions were answered to the patient's satisfaction.     Tatayana Beshears

## 2022-10-21 NOTE — Progress Notes (Signed)
ANTICOAGULATION CONSULT NOTE - Initial Consult  Pharmacy Consult for heparin Indication:  IABP + atrial flutter  No Known Allergies  Patient Measurements: Height: '6\' 2"'$  (188 cm) Weight: 89.8 kg (197 lb 15.6 oz) IBW/kg (Calculated) : 82.2 Heparin Dosing Weight: 88 kg  Vital Signs: Temp: 96 F (35.6 C) (11/24 0645) BP: 125/81 (11/24 1123) Pulse Rate: 0 (11/24 1123)  Labs: Recent Labs    10/20/22 1715 10/20/22 1932 10/20/22 1957 10/21/22 0101 10/21/22 0457 10/21/22 0500 10/21/22 0754 10/21/22 0909 10/21/22 0942 10/21/22 1004  HGB 13.1  --    < > 14.0   < > 13.4   < > 13.3 12.6* 11.9*  HCT 37.5*  --    < > 40.7   < > 39.6   < > 39.0 37.0* 35.0*  PLT 236  --   --  233  --  193  --   --   --   --   CREATININE 0.64  --   --  1.77*  --  2.44*  --   --   --   --   TROPONINIHS 6 6  --   --   --   --   --   --   --   --    < > = values in this interval not displayed.    Estimated Creatinine Clearance: 35.6 mL/min (A) (by C-G formula based on SCr of 2.44 mg/dL (H)).   Medical History: Past Medical History:  Diagnosis Date   Hypertension    Leukemia ([place])    PAF (paroxysmal atrial fibrillation) ([place]) 04/27/2018    Medications:  Eliquis 5 mg BID PTA   Assessment: Brendan Ellis presented with increasing dyspnea on exertion for the past 2 weeks. Was found to be in atrial flutter with RVR on initial EKG. Due to worsening respiratory status, he was intubated overnight and started on vasopressor support. He went into PEA arrest and required 6 minutes of CPR and epi prior to ROSC. Given cardiogenic shock, he was taken to the cath lab this morning and an IABP was placed for further support. He has been taking Eliquis PTA for atrial flutter and last dose was 11/23 at 0900. Pharmacy has been consulted for heparin dosing.   Goal of Therapy:  Heparin level 0.3-0.5 units/ml aPTT 66-85 seconds Monitor platelets by anticoagulation protocol: Yes   Plan:  Initiate heparin infusion at  1000 units/hr. Obtain heparin level and aPTT in 6 hours. Monitor heparin level, aPTT, CBC, renal function, and s/sx of bleeding daily.  Sinda Du, PharmD Candidate 10/21/2022,12:32 PM

## 2022-10-22 ENCOUNTER — Inpatient Hospital Stay (HOSPITAL_COMMUNITY): Payer: Medicare HMO

## 2022-10-22 DIAGNOSIS — R570 Cardiogenic shock: Secondary | ICD-10-CM | POA: Diagnosis not present

## 2022-10-22 DIAGNOSIS — I462 Cardiac arrest due to underlying cardiac condition: Secondary | ICD-10-CM

## 2022-10-22 DIAGNOSIS — I5023 Acute on chronic systolic (congestive) heart failure: Secondary | ICD-10-CM

## 2022-10-22 DIAGNOSIS — J810 Acute pulmonary edema: Secondary | ICD-10-CM | POA: Diagnosis not present

## 2022-10-22 DIAGNOSIS — R4182 Altered mental status, unspecified: Secondary | ICD-10-CM

## 2022-10-22 DIAGNOSIS — E876 Hypokalemia: Secondary | ICD-10-CM

## 2022-10-22 DIAGNOSIS — J9600 Acute respiratory failure, unspecified whether with hypoxia or hypercapnia: Secondary | ICD-10-CM | POA: Diagnosis not present

## 2022-10-22 DIAGNOSIS — G931 Anoxic brain damage, not elsewhere classified: Secondary | ICD-10-CM | POA: Diagnosis not present

## 2022-10-22 DIAGNOSIS — I4891 Unspecified atrial fibrillation: Secondary | ICD-10-CM | POA: Diagnosis not present

## 2022-10-22 LAB — COMPREHENSIVE METABOLIC PANEL
ALT: 5030 U/L — ABNORMAL HIGH (ref 0–44)
AST: >10000 U/L — ABNORMAL HIGH (ref 15–41)
Albumin: 2.6 g/dL — ABNORMAL LOW (ref 3.5–5.0)
Alkaline Phosphatase: 94 U/L (ref 38–126)
Anion gap: 26 — ABNORMAL HIGH (ref 5–15)
BUN: 46 mg/dL — ABNORMAL HIGH (ref 8–23)
CO2: 23 mmol/L (ref 22–32)
Calcium: 7.8 mg/dL — ABNORMAL LOW (ref 8.9–10.3)
Chloride: 93 mmol/L — ABNORMAL LOW (ref 98–111)
Creatinine, Ser: 3.88 mg/dL — ABNORMAL HIGH (ref 0.61–1.24)
GFR, Estimated: 17 mL/min — ABNORMAL LOW (ref >60)
Glucose, Bld: 338 mg/dL — ABNORMAL HIGH (ref 70–99)
Potassium: 3.3 mmol/L — ABNORMAL LOW (ref 3.5–5.1)
Sodium: 142 mmol/L (ref 135–145)
Total Bilirubin: 2.6 mg/dL — ABNORMAL HIGH (ref 0.3–1.2)
Total Protein: 5.8 g/dL — ABNORMAL LOW (ref 6.5–8.1)

## 2022-10-22 LAB — URINE CULTURE: Culture: NO GROWTH

## 2022-10-22 LAB — POCT I-STAT 7, (LYTES, BLD GAS, ICA,H+H)
Acid-Base Excess: 5 mmol/L — ABNORMAL HIGH (ref 0.0–2.0)
Acid-Base Excess: 5 mmol/L — ABNORMAL HIGH (ref 0.0–2.0)
Acid-Base Excess: 8 mmol/L — ABNORMAL HIGH (ref 0.0–2.0)
Bicarbonate: 28.2 mmol/L — ABNORMAL HIGH (ref 20.0–28.0)
Bicarbonate: 28.6 mmol/L — ABNORMAL HIGH (ref 20.0–28.0)
Bicarbonate: 29.6 mmol/L — ABNORMAL HIGH (ref 20.0–28.0)
Calcium, Ion: 0.92 mmol/L — ABNORMAL LOW (ref 1.15–1.40)
Calcium, Ion: 0.95 mmol/L — ABNORMAL LOW (ref 1.15–1.40)
Calcium, Ion: 0.98 mmol/L — ABNORMAL LOW (ref 1.15–1.40)
HCT: 34 % — ABNORMAL LOW (ref 39.0–52.0)
HCT: 34 % — ABNORMAL LOW (ref 39.0–52.0)
HCT: 33 % — ABNORMAL LOW (ref 39.0–52.0)
Hemoglobin: 11.6 g/dL — ABNORMAL LOW (ref 13.0–17.0)
Hemoglobin: 11.6 g/dL — ABNORMAL LOW (ref 13.0–17.0)
Hemoglobin: 11.2 g/dL — ABNORMAL LOW (ref 13.0–17.0)
O2 Saturation: 100 %
O2 Saturation: 99 %
O2 Saturation: 99 %
Patient temperature: 36.4
Patient temperature: 36
Patient temperature: 36
Potassium: 3.2 mmol/L — ABNORMAL LOW (ref 3.5–5.1)
Potassium: 3.3 mmol/L — ABNORMAL LOW (ref 3.5–5.1)
Potassium: 3.2 mmol/L — ABNORMAL LOW (ref 3.5–5.1)
Sodium: 139 mmol/L (ref 135–145)
Sodium: 140 mmol/L (ref 135–145)
Sodium: 139 mmol/L (ref 135–145)
TCO2: 29 mmol/L (ref 22–32)
TCO2: 30 mmol/L (ref 22–32)
TCO2: 31 mmol/L (ref 22–32)
pCO2 arterial: 36.5 mmHg (ref 32–48)
pCO2 arterial: 34.1 mmHg (ref 32–48)
pCO2 arterial: 28.8 mmHg — ABNORMAL LOW (ref 32–48)
pH, Arterial: 7.493 — ABNORMAL HIGH (ref 7.35–7.45)
pH, Arterial: 7.529 — ABNORMAL HIGH (ref 7.35–7.45)
pH, Arterial: 7.618 (ref 7.35–7.45)
pO2, Arterial: 223 mmHg — ABNORMAL HIGH (ref 83–108)
pO2, Arterial: 139 mmHg — ABNORMAL HIGH (ref 83–108)
pO2, Arterial: 115 mmHg — ABNORMAL HIGH (ref 83–108)

## 2022-10-22 LAB — PHOSPHORUS: Phosphorus: 5.1 mg/dL — ABNORMAL HIGH (ref 2.5–4.6)

## 2022-10-22 LAB — CBC
HCT: 31.6 % — ABNORMAL LOW (ref 39.0–52.0)
HCT: 31.9 % — ABNORMAL LOW (ref 39.0–52.0)
Hemoglobin: 11.3 g/dL — ABNORMAL LOW (ref 13.0–17.0)
Hemoglobin: 11.4 g/dL — ABNORMAL LOW (ref 13.0–17.0)
MCH: 30.2 pg (ref 26.0–34.0)
MCH: 30.6 pg (ref 26.0–34.0)
MCHC: 35.7 g/dL (ref 30.0–36.0)
MCHC: 35.8 g/dL (ref 30.0–36.0)
MCV: 84.6 fL (ref 80.0–100.0)
MCV: 85.6 fL (ref 80.0–100.0)
Platelets: 138 10*3/uL — ABNORMAL LOW (ref 150–400)
Platelets: 175 10*3/uL (ref 150–400)
RBC: 3.69 MIL/uL — ABNORMAL LOW (ref 4.22–5.81)
RBC: 3.77 MIL/uL — ABNORMAL LOW (ref 4.22–5.81)
RDW: 14.7 % (ref 11.5–15.5)
RDW: 15.2 % (ref 11.5–15.5)
WBC: 23.9 10*3/uL — ABNORMAL HIGH (ref 4.0–10.5)
WBC: 29.8 10*3/uL — ABNORMAL HIGH (ref 4.0–10.5)
nRBC: 0.3 % — ABNORMAL HIGH (ref 0.0–0.2)
nRBC: 2.4 % — ABNORMAL HIGH (ref 0.0–0.2)

## 2022-10-22 LAB — BASIC METABOLIC PANEL
Anion gap: 22 — ABNORMAL HIGH (ref 5–15)
BUN: 57 mg/dL — ABNORMAL HIGH (ref 8–23)
CO2: 25 mmol/L (ref 22–32)
Calcium: 8.7 mg/dL — ABNORMAL LOW (ref 8.9–10.3)
Chloride: 94 mmol/L — ABNORMAL LOW (ref 98–111)
Creatinine, Ser: 4.71 mg/dL — ABNORMAL HIGH (ref 0.61–1.24)
GFR, Estimated: 13 mL/min — ABNORMAL LOW (ref >60)
Glucose, Bld: 135 mg/dL — ABNORMAL HIGH (ref 70–99)
Potassium: 3.3 mmol/L — ABNORMAL LOW (ref 3.5–5.1)
Sodium: 141 mmol/L (ref 135–145)

## 2022-10-22 LAB — LACTIC ACID, PLASMA
Lactic Acid, Venous: >9 mmol/L (ref 0.5–1.9)
Lactic Acid, Venous: 5.8 mmol/L (ref 0.5–1.9)
Lactic Acid, Venous: 3.1 mmol/L (ref 0.5–1.9)

## 2022-10-22 LAB — COOXEMETRY PANEL
Carboxyhemoglobin: 1.8 % — ABNORMAL HIGH (ref 0.5–1.5)
Carboxyhemoglobin: 1.7 % — ABNORMAL HIGH (ref 0.5–1.5)
Carboxyhemoglobin: 1.2 % (ref 0.5–1.5)
Carboxyhemoglobin: 2.2 % — ABNORMAL HIGH (ref 0.5–1.5)
Methemoglobin: 0.9 % (ref 0.0–1.5)
Methemoglobin: 0.8 % (ref 0.0–1.5)
Methemoglobin: 1.3 % (ref 0.0–1.5)
Methemoglobin: <0.7 % (ref 0.0–1.5)
O2 Saturation: 94.6 %
O2 Saturation: 90.4 %
O2 Saturation: 98.9 %
O2 Saturation: 82 %
Total hemoglobin: 11.8 g/dL — ABNORMAL LOW (ref 12.0–16.0)
Total hemoglobin: 11.5 g/dL — ABNORMAL LOW (ref 12.0–16.0)
Total hemoglobin: 11.6 g/dL — ABNORMAL LOW (ref 12.0–16.0)
Total hemoglobin: 11.3 g/dL — ABNORMAL LOW (ref 12.0–16.0)

## 2022-10-22 LAB — POCT I-STAT EG7
Acid-Base Excess: 7 mmol/L — ABNORMAL HIGH (ref 0.0–2.0)
Bicarbonate: 30 mmol/L — ABNORMAL HIGH (ref 20.0–28.0)
Calcium, Ion: 0.99 mmol/L — ABNORMAL LOW (ref 1.15–1.40)
HCT: 33 % — ABNORMAL LOW (ref 39.0–52.0)
Hemoglobin: 11.2 g/dL — ABNORMAL LOW (ref 13.0–17.0)
O2 Saturation: 98 %
Potassium: 3.3 mmol/L — ABNORMAL LOW (ref 3.5–5.1)
Sodium: 140 mmol/L (ref 135–145)
TCO2: 31 mmol/L (ref 22–32)
pCO2, Ven: 35.7 mmHg — ABNORMAL LOW (ref 44–60)
pH, Ven: 7.532 — ABNORMAL HIGH (ref 7.25–7.43)
pO2, Ven: 100 mmHg — ABNORMAL HIGH (ref 32–45)

## 2022-10-22 LAB — POTASSIUM: Potassium: 3.5 mmol/L (ref 3.5–5.1)

## 2022-10-22 LAB — GLUCOSE, CAPILLARY
Glucose-Capillary: 312 mg/dL — ABNORMAL HIGH (ref 70–99)
Glucose-Capillary: 245 mg/dL — ABNORMAL HIGH (ref 70–99)
Glucose-Capillary: 131 mg/dL — ABNORMAL HIGH (ref 70–99)
Glucose-Capillary: 113 mg/dL — ABNORMAL HIGH (ref 70–99)
Glucose-Capillary: 153 mg/dL — ABNORMAL HIGH (ref 70–99)

## 2022-10-22 LAB — TRIGLYCERIDES: Triglycerides: 138 mg/dL (ref <150)

## 2022-10-22 LAB — APTT
aPTT: 46 seconds — ABNORMAL HIGH (ref 24–36)
aPTT: 51 seconds — ABNORMAL HIGH (ref 24–36)
aPTT: 57 seconds — ABNORMAL HIGH (ref 24–36)
aPTT: 68 seconds — ABNORMAL HIGH (ref 24–36)

## 2022-10-22 LAB — HEMOGLOBIN A1C
Hgb A1c MFr Bld: 6.5 % — ABNORMAL HIGH (ref 4.8–5.6)
Mean Plasma Glucose: 140 mg/dL

## 2022-10-22 LAB — HEPARIN LEVEL (UNFRACTIONATED): Heparin Unfractionated: 1.07 IU/mL — ABNORMAL HIGH (ref 0.30–0.70)

## 2022-10-22 LAB — MAGNESIUM: Magnesium: 2.3 mg/dL (ref 1.7–2.4)

## 2022-10-22 MED ORDER — POTASSIUM CHLORIDE 10 MEQ/50ML IV SOLN
10.0000 meq | INTRAVENOUS | Status: AC
  Administered 2022-10-22 (×2): 10 meq via INTRAVENOUS
  Filled 2022-10-22 (×2): qty 50

## 2022-10-22 MED ORDER — INSULIN GLARGINE-YFGN 100 UNIT/ML ~~LOC~~ SOLN
20.0000 [IU] | Freq: Every day | SUBCUTANEOUS | Status: DC
  Administered 2022-10-22 – 2022-10-26 (×5): 20 [IU] via SUBCUTANEOUS
  Filled 2022-10-22 (×5): qty 0.2

## 2022-10-22 MED ORDER — SODIUM CHLORIDE 0.9 % IV SOLN
4.0000 g | Freq: Once | INTRAVENOUS | Status: AC
  Administered 2022-10-22: 4 g via INTRAVENOUS
  Filled 2022-10-22: qty 40

## 2022-10-22 MED ORDER — FUROSEMIDE 10 MG/ML IJ SOLN
120.0000 mg | Freq: Once | INTRAVENOUS | Status: AC
  Administered 2022-10-22: 120 mg via INTRAVENOUS
  Filled 2022-10-22: qty 10

## 2022-10-22 MED ORDER — PIPERACILLIN-TAZOBACTAM IN DEX 2-0.25 GM/50ML IV SOLN
2.2500 g | Freq: Three times a day (TID) | INTRAVENOUS | Status: DC
  Administered 2022-10-23 – 2022-10-25 (×7): 2.25 g via INTRAVENOUS
  Filled 2022-10-22 (×10): qty 50

## 2022-10-22 MED ORDER — ARTIFICIAL TEARS OPHTHALMIC OINT
TOPICAL_OINTMENT | OPHTHALMIC | Status: DC | PRN
  Filled 2022-10-22: qty 3.5

## 2022-10-22 MED ORDER — CALCIUM GLUCONATE-NACL 2-0.675 GM/100ML-% IV SOLN
2.0000 g | Freq: Once | INTRAVENOUS | Status: AC
  Administered 2022-10-22: 2000 mg via INTRAVENOUS
  Filled 2022-10-22: qty 100

## 2022-10-22 MED ORDER — CALCIUM GLUCONATE-NACL 1-0.675 GM/50ML-% IV SOLN
1.0000 g | Freq: Once | INTRAVENOUS | Status: AC
  Administered 2022-10-22: 1000 mg via INTRAVENOUS
  Filled 2022-10-22: qty 50

## 2022-10-22 NOTE — Progress Notes (Signed)
PHARMACY NOTE:  ANTIMICROBIAL RENAL DOSAGE ADJUSTMENT  Current antimicrobial regimen includes a mismatch between antimicrobial dosage and estimated renal function.  As per policy approved by the Pharmacy & Therapeutics and Medical Executive Committees, the antimicrobial dosage will be adjusted accordingly.  Current antimicrobial dosage:  Zosyn 3.375g IV every 8 hours  Indication: Aspiration PNA  Renal Function:  Estimated Creatinine Clearance: 18.4 mL/min (A) (by C-G formula based on SCr of 4.71 mg/dL (H)). '[]'$      On intermittent HD, scheduled: '[]'$      On CRRT    Antimicrobial dosage has been changed to:  Zosyn 2.25g IV every 8 hours   Additional comments:   Thank you for allowing pharmacy to be a part of this patient's care.  Antonietta Jewel, PharmD, [place] Clinical Pharmacist  Phone: [phone] 10/22/2022 6:37 PM  Please check AMION for all [facility] phone numbers After 10:00 PM, call [place] [phone]

## 2022-10-22 NOTE — Consult Note (Signed)
NAME:  Brendan Ellis, MRN:  [id number], DOB:  11-Jun-1956, LOS: 2 ADMISSION DATE:  10/20/2022, CONSULTATION DATE:  10/21/2022 REFERRING MD:  Dr. Posey Pronto, [facility], CHIEF COMPLAINT:  Short of breath   History of Present Illness:  66 yo male former smoker developed cough, sore throat and dyspnea about 1 week prior to admission.  Started on omnicef by his PCP, but symptoms progressed.  He presented to [facility] on 10/20/22.  He is followed by cardiology in [facility] for chemo induced CM and a fib.  He had A fib with RVR in the ER and started on amiodarone.  CT angio chest 2.2 x 1.8 cm pretracheal LN, small chronic loculated effusions, consolidative ASD in lower lobes, and interlobular septal thickening.  Started on IV ABx, diuretics and supplemental oxygen.  He was tried on Bipap but wasn't able to tolerate this.  PCCM consulted to arrange for management in ICU.  Pertinent  Medical History  Non ischemic dilated CM with HFrEF, s/p AICD, PAF on eliquis, Leukemia 2019, HTN  Significant Hospital Events: Including procedures, antibiotic start and stop dates in addition to other pertinent events   11/23 Admit 11/24 Transfer to ICU  Interim History / Subjective:  Patient remains intubated on multiple vasopressors Started with myoclonus and then generalized tonic-clonic seizures CT head was done which showed no acute abnormalities  Objective   Blood pressure (!) 88/65, pulse (!) 188, temperature 97.9 F (36.6 C), resp. rate 19, height '6\' 2"'$  (1.88 m), weight 97.8 kg, SpO2 (!) 84 %. PAP: (21-47)/(12-35) 26/18 CVP:  [2 mmHg-25 mmHg] 10 mmHg CO:  [5.2 L/min-10.4 L/min] 8.4 L/min CI:  [2.4 L/min/m2-4.9 L/min/m2] 3.9 L/min/m2  Vent Mode: PRVC FiO2 (%):  [50 %-100 %] 50 % Set Rate:  [18 bmp-28 bmp] 18 bmp Vt Set:  [650 mL] 650 mL PEEP:  [5 JSE83-15 cmH20] 5 cmH20 Plateau Pressure:  [17 cmH20-30 cmH20] 17 cmH20   Intake/Output Summary (Last 24 hours) at 10/22/2022 0950 Last data filed at 10/22/2022 0900 Gross  per 24 hour  Intake 4741.95 ml  Output 815 ml  Net 3926.95 ml   Filed Weights   10/20/22 2327 10/21/22 0306 10/22/22 0500  Weight: 88 kg 89.8 kg 97.8 kg    Examination:   Physical exam: General: Crtitically ill-appearing male, orally intubated HEENT: [place]/AT, eyes anicteric.  ETT and OGT in place Neuro: Eyes closed, does not open, not following commands, pupils 2 mm bilateral nonreactive to light, cough and gag present.  Stimulus induced myoclonus noted Chest: Coarse breath sounds, no wheezes or rhonchi Heart: Irregularly irregular, no murmurs or gallops Abdomen: Soft, nondistended, bowel sounds present Skin: No rash   Resolved Hospital Problem list     Assessment & Plan:  Acute hypoxic respiratory failure with aspiration pneumonia and pulmonary edema Septic shock Continue antibiotics with Zosyn Continue lung protective ventilation VAP bundle in place PAD protocol with propofol FiO2 was titrated down to 50% Continue diuresis Follow-up cultures  Paroxysmal A fib with RVR. 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  Best Practice (right click and "Reselect all SmartList Selections" daily)   Diet/type: NPO DVT prophylaxis: Systemic heparin GI prophylaxis: PPI Lines: Yes needed Foley: Yes still needed Code Status:  full code Last date of multidisciplinary goals of care discussion [11/25 patient's family was updated   Labs   CBC: Recent Labs  Lab 10/20/22 1715 10/20/22 1957 10/21/22 0101 10/21/22 0457 10/21/22 0500 10/21/22 0754 10/21/22 2017 10/21/22 2316 10/21/22 2335 10/22/22 0220 10/22/22 0542  WBC 10.6*  --  19.2*  --  19.9*  --   --   --  23.9*  --   --   NEUTROABS 7.7  --   --   --   --   --   --   --   --   --   --   HGB 13.1   < > 14.0   < > 13.4   < > 12.2* 11.2* 11.3* 11.6* 11.6*  HCT 37.5*   < > 40.7   < > 39.6   < > 36.0* 33.0* 31.6* 34.0* 34.0*  MCV 85.2  --  87.5  --  89.2  --   --   --  85.6  --   --   PLT 236  --  233  --  193  --   --   --  175  --   --    < > = values in this interval not displayed.    Basic Metabolic Panel: Recent Labs  Lab 10/20/22 1715 10/20/22 1957 10/21/22 0101 10/21/22 0457 10/21/22 0500 10/21/22 0754 10/21/22 1414 10/21/22 1832 10/21/22 2035 10/21/22 2316 10/22/22 0217 10/22/22 0220 10/22/22 0542  NA 138   < > 140   < > 146*   < > 146*   < > 144 140 142 139 140  K 2.8*   < > 3.2*   < > 4.5   < > 3.4*   < > 3.3* 3.1* 3.3* 3.2* 3.3*  CL 105  --  103  --  101  --  98  --  99  --  93*  --    --   CO2 24  --  16*  --  12*  --  12*  --  19*  --  23  --   --   GLUCOSE 177*  --  209*  --  139*  --  293*  --  344*  --  338*  --   --   BUN 23  --  25*  --  29*  --  34*  --  40*  --  46*  --   --   CREATININE 0.64  --  1.77*  --  2.44*  --  3.05*  --  3.35*  --  3.88*  --   --   CALCIUM 7.7*  --  8.1*  --  7.6*  --  6.9*  --  6.9*  --  7.8*  --   --   MG 1.4*  --  2.3  --  2.6*  --   --   --  1.9  --  2.3  --   --   PHOS  --   --   --   --   --   --   --   --   --   --  5.1*  --   --    < > = values in this interval not displayed.   GFR: Estimated Creatinine Clearance: 22.4 mL/min (A) (by C-G formula based on SCr of 3.88 mg/dL (H)). Recent Labs  Lab 10/20/22 1715 10/21/22 0101 10/21/22 0215 10/21/22 0500 10/21/22 0631 10/21/22 1824 10/21/22 2144 10/21/22 2335 10/21/22 2342 10/22/22 0541  WBC 10.6* 19.2*  --  19.9*  --   --   --  23.9*  --   --   LATICACIDVEN  --  8.2*   < >  --    < > >9.0* >9.0*  --  >9.0* 5.8*   < > = values in this interval not displayed.    Liver Function Tests: Recent Labs  Lab 10/20/22 1715 10/22/22 0217  AST 53* >10,000*  ALT 59* 5,030*  ALKPHOS 32* 94  BILITOT 1.6* 2.6*  PROT 7.1 5.8*  ALBUMIN 3.2* 2.6*   No results for input(s): "LIPASE", "AMYLASE" in the last 168 hours. No results for input(s): "AMMONIA" in the last 168 hours.  ABG    Component Value Date/Time   PHART 7.529 (H) 10/22/2022 0542   PCO2ART 34.1 10/22/2022 0542   PO2ART 139 (H) 10/22/2022 0542   HCO3 28.6 (H) 10/22/2022 0542   TCO2 30 10/22/2022 0542   ACIDBASEDEF 1.0 10/21/2022 2017   O2SAT 98.9 10/22/2022 0838     Coagulation Profile: No results for input(s): "INR", "PROTIME" in the last 168 hours.  Cardiac Enzymes: No results for input(s): "CKTOTAL", "CKMB", "CKMBINDEX", "TROPONINI" in the last 168 hours.  HbA1C: Hgb A1c MFr Bld  Date/Time Value Ref Range Status  10/21/2022 01:52 AM 6.5 (H) 4.8 - 5.6 % Final    Comment:    (NOTE)         Prediabetes:  5.7 - 6.4         Diabetes: >6.4         Glycemic control for adults with diabetes: <7.0   12/18/2020 10:03 PM >15.5 (H) 4.8 - 5.6 % Final    Comment:    (NOTE) **Verified by repeat analysis**         Prediabetes: 5.7 - 6.4         Diabetes: >6.4         Glycemic control for adults with diabetes: <7.0     CBG: Recent Labs  Lab 10/21/22 1522 10/21/22 2015 10/21/22 2315 10/22/22 0429 10/22/22 0728  GLUCAP 245* 337* 330* 312* 245*    Total critical care time: 49 minutes  Performed by: Wilmer care time was exclusive of separately billable procedures and treating other patients.   Critical care was necessary to treat or prevent imminent or life-threatening deterioration.   Critical care was time spent personally by me on the following activities: development of treatment plan with patient and/or surrogate as well as nursing, discussions  with consultants, evaluation of patient's response to treatment, examination of patient, obtaining history from patient or surrogate, ordering and performing treatments and interventions, ordering and review of laboratory studies, ordering and review of radiographic studies, pulse oximetry and re-evaluation of patient's condition.   Jacky Kindle, MD [facility] See Amion for pager If no response to pager, please call [phone] until 7pm After 7pm, Please call E-link [phone]

## 2022-10-22 NOTE — Progress Notes (Signed)
ANTICOAGULATION CONSULT NOTE - Initial Consult  Pharmacy Consult for heparin Indication:  IABP + atrial flutter  No Known Allergies  Patient Measurements: Height: '6\' 2"'$  (188 cm) Weight: 97.8 kg (215 lb 9.8 oz) IBW/kg (Calculated) : 82.2 Heparin Dosing Weight: 88 kg  Vital Signs: Temp: 97.7 F (36.5 C) (11/25 1000) Temp Source: Core (11/25 0800) Pulse Rate: 198 (11/25 1000)  Labs: Recent Labs    10/20/22 1715 10/20/22 1932 10/20/22 1957 10/21/22 0101 10/21/22 0457 10/21/22 0500 10/21/22 0754 10/21/22 1414 10/21/22 1832 10/21/22 2035 10/21/22 2316 10/21/22 2335 10/22/22 0217 10/22/22 0220 10/22/22 0542 10/22/22 0936  HGB 13.1  --    < > 14.0   < > 13.4   < >  --    < >  --    < > 11.3*  --  11.6* 11.6*  --   HCT 37.5*  --    < > 40.7   < > 39.6   < >  --    < >  --    < > 31.6*  --  34.0* 34.0*  --   PLT 236  --   --  233  --  193  --   --   --   --   --  175  --   --   --   --   APTT  --   --   --   --   --   --   --   --   --   --   --  46*  --   --   --  51*  HEPARINUNFRC  --   --   --   --   --   --   --   --   --   --   --  1.07*  --   --   --   --   CREATININE 0.64  --   --  1.77*  --  2.44*  --  3.05*  --  3.35*  --   --  3.88*  --   --   --   TROPONINIHS 6 6  --   --   --   --   --   --   --   --   --   --   --   --   --   --    < > = values in this interval not displayed.     Estimated Creatinine Clearance: 22.4 mL/min (A) (by C-G formula based on SCr of 3.88 mg/dL (H)).   Medical History: Past Medical History:  Diagnosis Date   Hypertension    Leukemia ([place])    PAF (paroxysmal atrial fibrillation) ([place]) 04/27/2018    Medications:  Eliquis 5 mg BID PTA   Assessment: Mr. Mankey presented with increasing dyspnea on exertion for the past 2 weeks. Was found to be in atrial flutter with RVR on initial EKG. Due to worsening respiratory status, he was intubated overnight and started on vasopressor support. He went into PEA arrest and required 6 minutes  of CPR and epi prior to ROSC. Given cardiogenic shock, he was taken to the cath lab this morning and an IABP was placed for further support. He has been taking Eliquis PTA for atrial flutter and last dose was 11/23 at 0900. Pharmacy has been consulted for heparin dosing.   aPTT is subtherapeutic at 51 today on 1200 units/hr. No CBC ordered or drawn. Will order with next  recheck for today. Heparin level and aPTT do not correlate.  Goal of Therapy:  Heparin level 0.3-0.5 units/ml aPTT 66-85 seconds Monitor platelets by anticoagulation protocol: Yes   Plan:  Increase heparin infusion to 1400 units/hr. aPTT and CBC at 1900 Obtain heparin level and aPTT daily Monitor heparin level, aPTT, CBC, renal function, and s/sx of bleeding daily.  Thank you for allowing pharmacy to participate in this patient's care.  Reatha Harps, PharmD PGY2 Pharmacy Resident 10/22/2022 10:47 AM Check [email or web address] for unit specific pharmacy number

## 2022-10-22 NOTE — Progress Notes (Signed)
ANTICOAGULATION CONSULT NOTE - Follow Up Consult  Pharmacy Consult for heparin Indication:  IABP/Aflutter  Labs: Recent Labs    10/20/22 1715 10/20/22 1932 10/20/22 1957 10/21/22 0101 10/21/22 0457 10/21/22 0500 10/21/22 0754 10/21/22 1414 10/21/22 1832 10/21/22 2017 10/21/22 2035 10/21/22 2316 10/21/22 2335  HGB 13.1  --    < > 14.0   < > 13.4   < >  --    < > 12.2*  --  11.2* 11.3*  HCT 37.5*  --    < > 40.7   < > 39.6   < >  --    < > 36.0*  --  33.0* 31.6*  PLT 236  --   --  233  --  193  --   --   --   --   --   --  175  APTT  --   --   --   --   --   --   --   --   --   --   --   --  46*  HEPARINUNFRC  --   --   --   --   --   --   --   --   --   --   --   --  1.07*  CREATININE 0.64  --   --  1.77*  --  2.44*  --  3.05*  --   --  3.35*  --   --   TROPONINIHS 6 6  --   --   --   --   --   --   --   --   --   --   --    < > = values in this interval not displayed.    Assessment: 66yo male subtherapeutic on heparin with initial dosing for IABP and Aflutter, Eliquis on hold; no infusion issues or signs of bleeding per RN other than some oozing from mouth noted during oral care.  Goal of Therapy:  aPTT 66-85 seconds   Plan:  Will increase heparin infusion by 2-3 units/kg/hr to 1200 units/hr and check level in 8 hours.    Wynona Neat, PharmD, BCPS  10/22/2022,1:07 AM

## 2022-10-22 NOTE — Progress Notes (Signed)
Subjective: One episode of possible twitching overnight. EEG read pending.   Exam: Vitals:   10/22/22 0645 10/22/22 0700  BP:    Pulse: (!) 258 (!) 217  Resp: 18 18  Temp: 98.1 F (36.7 C) 98.1 F (36.7 C)  SpO2: 99% 99%   Gen: In bed, NAD Resp: non-labored breathing, no acute distress Abd: soft, nt  Neuro: MS: He does not open eyes or follow commands CN: Pupils are reactive bilaterally, corneals are weak but present Motor: When providing nailbed pressure on his left index finger, he does twitch his left pinky finger slightly, occasional twitches of his feet seen as well, unclear if voluntary, myoclonic, or reflexive Sensory: As above   Pertinent Labs: Ionized calcium 0.95  Impression:  Impression: 65 year old male 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 Even though his downtime was on the shorter side, given the severe hypoxemia before hand and severe shock that has been problematic afterwards, it is very possible that he has had some degree of injury. His ionized calcium has been extremely low, which could certainly be contributing to both muscle twitching, refractory hypotension, as well as seizure.  This has improved, would recommend continuing to normalize it.  His EEG does not reveal any signs of seizure, there is some background activity, not clearly dismal.  At this point it is entirely possible that he has had a fairly severe anoxic injury, but without definitive signs and with potential confounders, would favor continued supportive care.  Recommendations: 1) continue treatment of hypocalcemia per CCM 2) continue Keppra 500 mg twice daily 3) continue EEG for now  This patient is critically ill and at significant risk of neurological worsening, death and care requires constant monitoring of vital signs, hemodynamics,respiratory and cardiac monitoring, neurological assessment, discussion with family, other specialists and medical decision making of high complexity. I  spent 35 minutes of neurocritical care time  in the care of  this patient. This was time spent independent of any time provided by nurse practitioner or PA.  Roland Rack, MD [facility] [phone]  If 7pm- 7am, please page neurology on call as listed in [facility]. 10/22/2022  10:57 AM

## 2022-10-22 NOTE — Procedures (Signed)
Patient Name: Brendan Ellis  MRN: [id number]  Epilepsy Attending: Lora Havens  Referring Physician/Provider: Jacky Kindle, MD  Duration: 10/21/2022 1844 to 10/22/2022 1844  Patient history: 66yo M s/p cardiac arrest now with myoclonic jerks. EEG to evaluate for seizure.py   Level of alertness: comatose  AEDs during EEG study: LEV, propofol  Technical aspects: This EEG study was done with scalp electrodes positioned according to the 10-20 International system of electrode placement. Electrical activity was reviewed with band pass filter of 1-'70Hz'$ , sensitivity of 7 uV/mm, display speed of 50m/sec with a '60Hz'$  notched filter applied as appropriate. EEG data were recorded continuously and digitally stored.  Video monitoring was available and reviewed as appropriate.  Description: EEG showed continuous generalized 5 to 8 Hz theta-alpha activity admixed with intermittent generalized 2-'3hz'$  delta slowing. There is also 15 to 18 Hz beta activity distributed symmetrically and diffusely.  Hyperventilation and photic stimulation were not performed.     Event button was pressed on 10/22/2022 at 0941 during which patient was noted to have coughing and jerking of bilateral upper extremities after suctioning. Concomitant eeg before, during and after the event didn't show any eeg change to suggest seizure.   ABNORMALITY - Continuous slow, generalized  IMPRESSION: This study is suggestive of severe diffuse encephalopathy, nonspecific etiology. No seizures or epileptiform discharges were seen throughout the recording.  Event button was pressed on 10/22/2022 at 0941 during which patient was noted to have coughing and jerking of bilateral upper extremities after suctioning without concomitant eeg change. This was most likely not an epileptic event.   Emmajane Altamura OBarbra Sarks

## 2022-10-22 NOTE — Progress Notes (Signed)
[facility] Progress Note Patient Name: Brendan Ellis DOB: [birth year] MRN: [id number]   Date of Service  10/22/2022  HPI/Events of Note  ABG reviewed. Ionized calcium 0.92.  eICU Interventions  Respiratory rate on the ventilator reduced to 18, calcium gluconate 2 gm iv x 1 ordered.        [name] [name] 10/22/2022, 2:37 AM

## 2022-10-22 NOTE — Progress Notes (Signed)
Advanced Heart Failure Rounding Note   Subjective:    Remains intubated. Had seizures/myoclonus yesterday -> propofol/Keppra.   Propofol turned off to assess wake-up status  On NE 19, VP 0.03, milrinone 0.125 and IABP -> now with high output  Co-ox 90% Lactic acid >9 -> 5.8  Back in AF  PAP: (21-47)/(12-35) 27/16 CVP:  [2 mmHg-25 mmHg] 9 mmHg CO:  [5.2 L/min-10.4 L/min] 8.4 L/min CI:  [2.4 L/min/m2-4.9 L/min/m2] 3.9 L/min/m2 SVR 626     Objective:   Weight Range:  Vital Signs:   Temp:  [93.2 F (34 C)-98.1 F (36.7 C)] 97.9 F (36.6 C) (11/25 0800) Pulse Rate:  [0-258] 204 (11/25 0800) Resp:  [0-28] 18 (11/25 0800) BP: (67-134)/(51-102) 88/65 (11/24 2200) SpO2:  [82 %-100 %] 100 % (11/25 0805) Arterial Line BP: (72-110)/(20-75) 91/51 (11/25 0800) FiO2 (%):  [50 %-100 %] 50 % (11/25 0805) Weight:  [97.8 kg] 97.8 kg (11/25 0500) Last BM Date :  (PTA)  Weight change: Filed Weights   10/20/22 2327 10/21/22 0306 10/22/22 0500  Weight: 88 kg 89.8 kg 97.8 kg    Intake/Output:   Intake/Output Summary (Last 24 hours) at 10/22/2022 0833 Last data filed at 10/22/2022 0800 Gross per 24 hour  Intake 4663.44 ml  Output 815 ml  Net 3848.44 ml     Physical Exam: General:  Intubated/unresponsive HEENT: normal + ETT + scleral edema Neck: supple. LIJ swan . Carotids 2+ bilat; no bruits. No lymphadenopathy or thryomegaly appreciated. Cor: PMI nondisplaced. irregular tachy. No rubs, gallops or murmurs. Lungs: clear Abdomen: soft, nontender, nondistended. No hepatosplenomegaly. No bruits or masses. Good bowel sounds. Extremities: no cyanosis, clubbing, rash, edema + LFA IABP Neuro: intubated. Non-responsive + myoclonus with babinski  Telemetry: AF 120   Labs: Basic Metabolic Panel: Recent Labs  Lab 10/20/22 1715 10/20/22 1957 10/21/22 0101 10/21/22 0457 10/21/22 0500 10/21/22 0754 10/21/22 1414 10/21/22 1832 10/21/22 2035 10/21/22 2316 10/22/22 0217  10/22/22 0220 10/22/22 0542  NA 138   < > 140   < > 146*   < > 146*   < > 144 140 142 139 140  K 2.8*   < > 3.2*   < > 4.5   < > 3.4*   < > 3.3* 3.1* 3.3* 3.2* 3.3*  CL 105  --  103  --  101  --  98  --  99  --  93*  --   --   CO2 24  --  16*  --  12*  --  12*  --  19*  --  23  --   --   GLUCOSE 177*  --  209*  --  139*  --  293*  --  344*  --  338*  --   --   BUN 23  --  25*  --  29*  --  34*  --  40*  --  46*  --   --   CREATININE 0.64  --  1.77*  --  2.44*  --  3.05*  --  3.35*  --  3.88*  --   --   CALCIUM 7.7*  --  8.1*  --  7.6*  --  6.9*  --  6.9*  --  7.8*  --   --   MG 1.4*  --  2.3  --  2.6*  --   --   --  1.9  --  2.3  --   --  PHOS  --   --   --   --   --   --   --   --   --   --  5.1*  --   --    < > = values in this interval not displayed.    Liver Function Tests: Recent Labs  Lab 10/20/22 1715 10/22/22 0217  AST 53* >10,000*  ALT 59* 5,030*  ALKPHOS 32* 94  BILITOT 1.6* 2.6*  PROT 7.1 5.8*  ALBUMIN 3.2* 2.6*   No results for input(s): "LIPASE", "AMYLASE" in the last 168 hours. No results for input(s): "AMMONIA" in the last 168 hours.  CBC: Recent Labs  Lab 10/20/22 1715 10/20/22 1957 10/21/22 0101 10/21/22 0457 10/21/22 0500 10/21/22 0754 10/21/22 2017 10/21/22 2316 10/21/22 2335 10/22/22 0220 10/22/22 0542  WBC 10.6*  --  19.2*  --  19.9*  --   --   --  23.9*  --   --   NEUTROABS 7.7  --   --   --   --   --   --   --   --   --   --   HGB 13.1   < > 14.0   < > 13.4   < > 12.2* 11.2* 11.3* 11.6* 11.6*  HCT 37.5*   < > 40.7   < > 39.6   < > 36.0* 33.0* 31.6* 34.0* 34.0*  MCV 85.2  --  87.5  --  89.2  --   --   --  85.6  --   --   PLT 236  --  233  --  193  --   --   --  175  --   --    < > = values in this interval not displayed.    Cardiac Enzymes: No results for input(s): "CKTOTAL", "CKMB", "CKMBINDEX", "TROPONINI" in the last 168 hours.  BNP: BNP (last 3 results) Recent Labs    10/20/22 1715  BNP 1,300.4*    ProBNP (last 3 results) No  results for input(s): "PROBNP" in the last 8760 hours.    Other results:  Imaging: DG CHEST PORT 1 VIEW  Result Date: 10/22/2022 CLINICAL DATA:  Intra-aortic balloon pump. EXAM: PORTABLE CHEST 1 VIEW COMPARISON:  October 21, 2022. FINDINGS: Stable cardiomegaly. Endotracheal and nasogastric tubes are unchanged in position. Left internal jugular Swan-Ganz catheter is noted with tip in right pulmonary artery. Left-sided defibrillator is unchanged in position. Stable bilateral lung opacities are noted. Distal tip of intra-aortic balloon catheter is seen in expected position midportion of descending thoracic aorta, which is significantly inferior to its location on prior radiograph. IMPRESSION: Distal tip of intra-aortic balloon catheter is seen in the expected position of midportion of descending thoracic aorta, which is significantly inferior to its location on prior radiograph November 24th. These results will be called to the ordering clinician or representative by the Radiologist Assistant, and communication documented in the PACS or zVision Dashboard. Otherwise stable support apparatus.  Stable bibasilar opacities. Electronically Signed   By: Marijo Conception M.D.   On: 10/22/2022 08:23   CT HEAD WO CONTRAST (5MM)  Result Date: 10/21/2022 CLINICAL DATA:  Seizure disorder, clinical change EXAM: CT HEAD WITHOUT CONTRAST TECHNIQUE: Contiguous axial images were obtained from the base of the skull through the vertex without intravenous contrast. RADIATION DOSE REDUCTION: This exam was performed according to the departmental dose-optimization program which includes automated exposure control, adjustment of the mA and/or kV according to patient size  and/or use of iterative reconstruction technique. COMPARISON:  None Available. FINDINGS: Brain: No evidence of acute infarction, hemorrhage, hydrocephalus, extra-axial collection or mass lesion/mass effect. Mild periventricular white matter hypodensity.  Vascular: No hyperdense vessel or unexpected calcification. Skull: Normal. Negative for fracture or focal lesion. Sinuses/Orbits: No acute finding. Other: None. IMPRESSION: No acute intracranial pathology. Small-vessel white matter disease. Electronically Signed   By: Delanna Ahmadi M.D.   On: 10/21/2022 16:59   ECHOCARDIOGRAM COMPLETE  Result Date: 10/21/2022    ECHOCARDIOGRAM REPORT   Patient Name:   Brendan Ellis Date of Exam: 10/21/2022 Medical Rec #:  [id number]     Height:       74.0 in Accession #:    [id number]    Weight:       198.0 lb Date of Birth:  07/29/1956     BSA:          2.164 m Patient Age:    66 years      BP:           86/64 mmHg Patient Gender: M             HR:           69 bpm. Exam Location:  Inpatient Procedure: 2D Echo, Cardiac Doppler and Color Doppler Indications:    CHF-Acute Systolic Z36.64  History:        Patient has prior history of Echocardiogram examinations, most                 recent 04/29/2018. Cardiomyopathy, Defibrillator,                 Arrythmias:Atrial Fibrillation; Risk Factors:Hypertension. IABP.  Sonographer:    Darlina Sicilian RDCS Referring Phys: [id number] [place]  1. Left ventricular ejection fraction, by estimation, is 20%. The left ventricle has severely decreased function. The left ventricle demonstrates global hypokinesis. The left ventricular internal cavity size was mildly dilated. Left ventricular diastolic parameters are indeterminate.  2. Right ventricular systolic function is moderately reduced. The right ventricular size is moderately enlarged. There is normal pulmonary artery systolic pressure. The estimated right ventricular systolic pressure is 59.5 mmHg.  3. Right atrial size was severely dilated.  4. The mitral valve is grossly normal. No evidence of mitral valve regurgitation. No evidence of mitral stenosis.  5. The aortic valve is tricuspid. There is mild calcification of the aortic valve. Aortic valve regurgitation is not  visualized. No aortic stenosis is present.  6. The inferior vena cava is dilated in size with <50% respiratory variability, suggesting right atrial pressure of 15 mmHg.  7. There appears to be a pulsating catheter in the aorta (IABP). Comparison(s): EF is worse than prior reported. FINDINGS  Left Ventricle: Left ventricular ejection fraction, by estimation, is 20%. The left ventricle has severely decreased function. The left ventricle demonstrates global hypokinesis. The left ventricular internal cavity size was mildly dilated. There is no left ventricular hypertrophy. Left ventricular diastolic parameters are indeterminate. Right Ventricle: The right ventricular size is moderately enlarged. No increase in right ventricular wall thickness. Right ventricular systolic function is moderately reduced. There is normal pulmonary artery systolic pressure. The tricuspid regurgitant velocity is 2.23 m/s, and with an assumed right atrial pressure of 15 mmHg, the estimated right ventricular systolic pressure is 63.8 mmHg. Left Atrium: Left atrial size was normal in size. Right Atrium: Right atrial size was severely dilated. Pericardium: Trivial pericardial effusion is present. Mitral Valve: The mitral valve is grossly normal.  No evidence of mitral valve regurgitation. No evidence of mitral valve stenosis. Tricuspid Valve: The tricuspid valve is grossly normal. Tricuspid valve regurgitation is trivial. Aortic Valve: The aortic valve is tricuspid. There is mild calcification of the aortic valve. Aortic valve regurgitation is not visualized. No aortic stenosis is present. Pulmonic Valve: The pulmonic valve was grossly normal. Pulmonic valve regurgitation is mild. No evidence of pulmonic stenosis. Aorta: The aortic root and ascending aorta are structurally normal, with no evidence of dilitation. Venous: The inferior vena cava is dilated in size with less than 50% respiratory variability, suggesting right atrial pressure of 15  mmHg. IAS/Shunts: No atrial level shunt detected by color flow Doppler. Additional Comments: A venous catheter is visualized in the right ventricle and right atrium.  LEFT VENTRICLE PLAX 2D LVIDd:         5.60 cm      Diastology LVIDs:         5.20 cm      LV e' medial:    7.58 cm/s LV PW:         0.80 cm      LV E/e' medial:  9.8 LV IVS:        1.10 cm      LV e' lateral:   11.30 cm/s LVOT diam:     2.30 cm      LV E/e' lateral: 6.6 LV SV:         71 LV SV Index:   33 LVOT Area:     4.15 cm  LV Volumes (MOD) LV vol d, MOD A2C: 251.0 ml LV vol d, MOD A4C: 202.0 ml LV vol s, MOD A2C: 127.0 ml LV vol s, MOD A4C: 130.0 ml LV SV MOD A2C:     124.0 ml LV SV MOD A4C:     202.0 ml LV SV MOD BP:      102.3 ml RIGHT VENTRICLE RV Basal diam:  5.60 cm RV Mid diam:    3.80 cm RV S prime:     9.20 cm/s TAPSE (M-mode): 1.2 cm LEFT ATRIUM             Index        RIGHT ATRIUM           Index LA diam:        3.30 cm 1.52 cm/m   RA Area:     29.00 cm LA Vol (A2C):   82.6 ml 38.19 ml/m  RA Volume:   114.00 ml 52.68 ml/m LA Vol (A4C):   59.0 ml 27.26 ml/m LA Biplane Vol: 64.5 ml 29.80 ml/m  AORTIC VALVE             PULMONIC VALVE LVOT Vmax:   98.00 cm/s  PR End Diast Vel: 5.48 msec LVOT Vmean:  72.250 cm/s LVOT VTI:    0.171 m  AORTA Ao Root diam: 2.80 cm Ao Asc diam:  3.20 cm MITRAL VALVE               TRICUSPID VALVE MV Area (PHT): 4.64 cm    TR Peak grad:   19.9 mmHg MV Decel Time: 164 msec    TR Vmax:        223.00 cm/s MV E velocity: 74.55 cm/s                            SHUNTS  Systemic VTI:  0.17 m                            Systemic Diam: 2.30 cm Rudean Haskell MD Electronically signed by Rudean Haskell MD Signature Date/Time: 10/21/2022/2:27:05 PM    Final    Port CXR  Result Date: 10/21/2022 CLINICAL DATA:  Central line placement. EXAM: PORTABLE CHEST 1 VIEW COMPARISON:  Same day. FINDINGS: Interval placement of left internal jugular Swan-Ganz catheter with tip directed into  right pulmonary artery. No pneumothorax is noted. Stable bibasilar opacities no other support apparatus is noted. IMPRESSION: Interval placement of left internal jugular Swan-Ganz catheter with tip directed into right pulmonary artery. Electronically Signed   By: Marijo Conception M.D.   On: 10/21/2022 13:17   CARDIAC CATHETERIZATION  Result Date: 10/21/2022 Findings: On NE 8 and VP 0.03 Ao =114/74 (84) RA = 18 RV = 44/19 PA = 44/23 (31) PCW = 24 Fick cardiac output/index = 5.3/2.4 TD CO/CI =2.9/1.4 SVR = 1806 FA sat = 100% PA sat =68% 72% PAPi = 1.2 On NE 5 and milrinone 0.25 Findings: Ao = 114/76 (85) RA = 19 Fick cardiac output/index = 6.9/3.2 TD CO/CI = 3.6/1.7 SVR = 1402 FA sat = 99% PA sat = 76% Assessment: Cardiogenic shock. Continue supportive care. Glori Bickers, MD 11:16 AM Plan/Discussion: Assessment: Plan/Discussion:  DG CHEST PORT 1 VIEW  Result Date: 10/21/2022 CLINICAL DATA:  A 66 year old male presents for evaluation of hypoxia. EXAM: PORTABLE CHEST 1 VIEW COMPARISON:  October 20, 2022. FINDINGS: Endotracheal tube approximately 5.5 cm from the carina. Stable mild cardiac enlargement. Pacer defibrillator remaining in place. Gastric tube coursing through in off the field of the radiograph, side port below the expected location of the GE junction. EKG leads project over the chest. Small bilateral pleural effusions, increased interstitial prominence and bibasilar airspace disease with slight increase since previous imaging. Skeletal structures are unremarkable to the extent evaluated on limited evaluation IMPRESSION: 1. Small bilateral pleural effusions, increased interstitial prominence and bibasilar airspace disease with slight increase since previous imaging. 2. Stable mild cardiac enlargement. 3. Post intubation, tip of the endotracheal tube in the proximal to mid trachea between clavicular heads. Electronically Signed   By: Zetta Bills M.D.   On: 10/21/2022 07:46   DG CHEST PORT 1  VIEW  Result Date: 10/21/2022 CLINICAL DATA:  66 year old male presenting for evaluation of the chest following intubation. EXAM: PORTABLE CHEST 1 VIEW COMPARISON:  Imaging from November 23rd and October 21, 2022. FINDINGS: EKG leads project over the chest. Pacer pads project over the chest. Dual lead pacer defibrillator remains in place. Post intubation, endotracheal tube tip approximately 4.5 cm from the carina tip just below clavicular heads. Gastric tube coursing into the upper abdomen, side port below GE junction. Cardiomediastinal contours are stable with persistent mild cardiac enlargement accentuated by AP projection. Unchanged appearance of basilar airspace disease and increased interstitial markings. Mild blunting of costodiaphragmatic sulci since previous imaging raising the question of very small bilateral pleural effusions. No visible pneumothorax. Skeletal structures are unremarkable to the extent evaluated on limited evaluation IMPRESSION: 1. Patient remains intubated with endotracheal tube tip just below the carina in the mid trachea. 2. Stable appearance of remaining support devices. 3. Stable appearance of diffuse interstitial and airspace disease greatest at the lung bases, again suspicious for infection perhaps aspiration related. Electronically Signed   By: Zetta Bills M.D.   On: 10/21/2022 07:42  CT Angio Chest PE W and/or Wo Contrast  Result Date: 10/20/2022 CLINICAL DATA:  PE suspected, high probability EXAM: CT ANGIOGRAPHY CHEST WITH CONTRAST TECHNIQUE: Multidetector CT imaging of the chest was performed using the standard protocol during bolus administration of intravenous contrast. Multiplanar CT image reconstructions and MIPs were obtained to evaluate the vascular anatomy. RADIATION DOSE REDUCTION: This exam was performed according to the departmental dose-optimization program which includes automated exposure control, adjustment of the mA and/or kV according to patient size  and/or use of iterative reconstruction technique. CONTRAST:  41m OMNIPAQUE IOHEXOL 350 MG/ML SOLN COMPARISON:  02/25/2018 FINDINGS: Cardiovascular: Satisfactory opacification of the pulmonary arteries to the segmental level. No evidence of pulmonary embolism. Gross cardiomegaly. Left coronary artery calcifications. No pericardial effusion. Aortic atherosclerosis. Mediastinum/Nodes: Newly enlarged mediastinal lymph nodes, pretracheal nodes measuring up to 2.2 x 1.8 cm (series 6, image 138) thyroid gland, trachea, and esophagus demonstrate no significant findings. Lungs/Pleura: Unchanged small, chronic, loculated pleural effusions with associated atelectasis consolidation. New, heterogeneous and consolidative airspace opacity in the dependent bilateral lung bases (series 5, image 91). Additional interlobular septal thickening throughout the lungs. Fissural nodules or pleural fluid loculations along the right minor fissure are stable and definitively benign (series 7, image 61). Upper Abdomen: No acute abnormality. Musculoskeletal: No chest wall abnormality. No acute osseous findings. Review of the MIP images confirms the above findings. IMPRESSION: 1. Negative examination for pulmonary embolism. 2. Heterogeneous consolidative airspace opacity of the dependent bilateral lung bases, consistent with infection or aspiration. 3. Interlobular septal thickening throughout the lungs, consistent with pulmonary edema. 4. Unchanged small, chronic, loculated pleural effusions with associated atelectasis or consolidation. 5. Newly enlarged mediastinal lymph nodes, likely reactive. 6. Gross cardiomegaly and coronary artery disease. Aortic Atherosclerosis (ICD10-I70.0). Electronically Signed   By: ADelanna AhmadiM.D.   On: 10/20/2022 19:09   DG Chest Port 1 View  Result Date: 10/20/2022 CLINICAL DATA:  Chest pain. EXAM: PORTABLE CHEST 1 VIEW COMPARISON:  Chest radiograph dated May 28, 2018 FINDINGS: The heart is enlarged.  Pacemaker leads in the right atrium and right ventricle. Bilateral lower lobe hazy opacities, which may represent bilateral pleural effusions and/or mild edema. No acute osseous abnormality IMPRESSION: Cardiomegaly with bilateral lower lobe hazy opacities, which may represent bilateral pleural effusions and/or mild edema. Electronically Signed   By: IKeane PoliceD.O.   On: 10/20/2022 17:36     Medications:     Scheduled Medications:  Chlorhexidine Gluconate Cloth  6 each Topical Daily   docusate  100 mg Per Tube BID   hydrocortisone sod succinate (SOLU-CORTEF) inj  100 mg Intravenous Q12H   insulin aspart  0-15 Units Subcutaneous Q4H   mouth rinse  15 mL Mouth Rinse Q2H   pantoprazole (PROTONIX) IV  40 mg Intravenous Daily   polyethylene glycol  17 g Per Tube Daily   sodium bicarbonate  100 mEq Intravenous Once   sodium chloride flush  3 mL Intravenous Q12H   sodium chloride flush  3 mL Intravenous Q12H    Infusions:  sodium chloride 10 mL/hr at 10/22/22 0800   sodium chloride     sodium chloride     sodium chloride Stopped (10/22/22 0752)   sodium chloride     amiodarone 30 mg/hr (10/22/22 0800)   epinephrine Stopped (10/22/22 0245)   heparin 1,200 Units/hr (10/22/22 0800)   levETIRAcetam Stopped (10/22/22 0320)   milrinone 0.125 mcg/kg/min (10/22/22 0800)   norepinephrine (LEVOPHED) Adult infusion 19 mcg/min (10/22/22 0800)   piperacillin-tazobactam (ZOSYN)  IV 12.5 mL/hr at 10/22/22 0800   propofol (DIPRIVAN) infusion Stopped (10/22/22 0753)   vasopressin 0.03 Units/min (10/22/22 0800)    PRN Medications: sodium chloride, sodium chloride, sodium chloride, acetaminophen **OR** acetaminophen, artificial tears, fentaNYL (SUBLIMAZE) injection, [START ON 10/24/2022] influenza vac split quadrivalent PF, LORazepam, midazolam, ondansetron (ZOFRAN) IV, ondansetron **OR** [DISCONTINUED] ondansetron (ZOFRAN) IV, mouth rinse, [START ON 10/24/2022] pneumococcal 20-valent conjugate vaccine,  senna-docusate, sodium chloride flush   Assessment/Plan:   1. 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 EF 35% in 2020. S/p Biotronik ICD - EF 35% -> 20% in setting of AF - Echo 10/21/22 EF 20% RV moderately reduced - S/p PEA arrest 11/23 - Now on IABP, NE 19, VP 0.03. milrinone 0.125. Co-ox 90% CVP 9 - Lactate > 9.0 -> 5.8 - Output high on swan . SR low. Stop milrinone. Check co-ox from sheath ro exclude shunt. Continue abx for possible aspiration.   2. Anoxic brain injury - having seizures/myoclonus -> propofol/Keppra - Brain CT 10/21/22 - normal - Neuro following - Suspect poor prognosis  3. PEA arrest - 10/20/22 - CPR x 6 mins  4. AF, persistent - likely cause of tachy CM - was in NSR yesterday. Now back in AF with RVR. - Continue heparin/IV amio  5. AKI - due to ATN/shock - SCr 0.8 -> 3.88 - Continue hemodynamic support - Consider CRRT pending Neuro prognostication. Currently not acidotic or markedly volume overloaded so can wait one more day and reassess  6. Acute hypoxic respiratory failure - due to HF/cardiac arrest - remains intubated  7. Shock liver - continue supportive  8. Hypocalcemia/hypokalemia - supp  9. H/o AML  - s/p chemo 2019  CRITICAL CARE Performed by: Glori Bickers  Total critical care time: 45 minutes  Critical care time was exclusive of separately billable procedures and treating other patients.  Critical care was necessary to treat or prevent imminent or life-threatening deterioration.  Critical care was time spent personally by me (independent of midlevel providers or residents) on the following activities: development of treatment plan with patient and/or surrogate as well as nursing, discussions with consultants, evaluation of patient's response to treatment, examination of patient, obtaining history from patient or surrogate, ordering and performing treatments and  interventions, ordering and review of laboratory studies, ordering and review of radiographic studies, pulse oximetry and re-evaluation of patient's condition.    Length of Stay: 2   Glori Bickers MD 10/22/2022, 8:33 AM  Advanced Heart Failure Team Pager [phone] (M-F; 7a - 4p)  Please contact [facility] for night-coverage after hours (4p -7a ) and weekends on [email or web address]

## 2022-10-22 NOTE — Progress Notes (Signed)
ANTICOAGULATION CONSULT NOTE  Pharmacy Consult for heparin Indication:  IABP + atrial flutter  No Known Allergies  Patient Measurements: Height: '6\' 2"'$  (188 cm) Weight: 97.8 kg (215 lb 9.8 oz) IBW/kg (Calculated) : 82.2 Heparin Dosing Weight: 88 kg  Vital Signs: Temp: 97.2 F (36.2 C) (11/25 2115) Temp Source: Core (11/25 1600) Pulse Rate: 266 (11/25 1800)  Labs: Recent Labs    10/20/22 1715 10/20/22 1932 10/20/22 1957 10/21/22 0500 10/21/22 0754 10/21/22 2035 10/21/22 2316 10/21/22 2335 10/22/22 0217 10/22/22 0220 10/22/22 0936 10/22/22 1619 10/22/22 1640 10/22/22 1759 10/22/22 2103  HGB 13.1  --    < > 13.4   < >  --    < > 11.3*  --    < >  --  11.4* 11.2* 11.2*  --   HCT 37.5*  --    < > 39.6   < >  --    < > 31.6*  --    < >  --  31.9* 33.0* 33.0*  --   PLT 236  --    < > 193  --   --   --  175  --   --   --  138*  --   --   --   APTT  --   --   --   --   --   --    < > 46*  --   --  51* 57*  --   --  68*  HEPARINUNFRC  --   --   --   --   --   --   --  1.07*  --   --   --   --   --   --   --   CREATININE 0.64  --    < > 2.44*   < > 3.35*  --   --  3.88*  --   --  4.71*  --   --   --   TROPONINIHS 6 6  --   --   --   --   --   --   --   --   --   --   --   --   --    < > = values in this interval not displayed.     Estimated Creatinine Clearance: 18.4 mL/min (A) (by C-G formula based on SCr of 4.71 mg/dL (H)).   Medical History: Past Medical History:  Diagnosis Date   Hypertension    Leukemia ([place])    PAF (paroxysmal atrial fibrillation) ([place]) 04/27/2018    Medications:  Eliquis 5 mg BID PTA   Assessment: Mr. Lewter presented with increasing dyspnea on exertion for the past 2 weeks. Was found to be in atrial flutter with RVR on initial EKG. Due to worsening respiratory status, he was intubated overnight and started on vasopressor support. He went into PEA arrest and required 6 minutes of CPR and epi prior to ROSC. Given cardiogenic shock, he was taken  to the cath lab this morning and an IABP was placed for further support. He has been taking Eliquis PTA for atrial flutter and last dose was 11/23 at 0900. Pharmacy has been consulted for heparin dosing.   aPTT is therapeutic at 68 tonight, on 1400 units/hr. Hgb 11.2, plt down slightly to 138. No s/sx of bleeding or infusion issues.   Goal of Therapy:  Heparin level 0.3-0.5 units/ml aPTT 66-85 seconds Monitor platelets by anticoagulation protocol: Yes  Plan:  Continue heparin infusion at 1400 units/hr. Obtain heparin level and aPTT daily Monitor heparin level, aPTT, CBC, renal function, and s/sx of bleeding daily.  Thank you for allowing pharmacy to participate in this patient's care.  Antonietta Jewel, PharmD, [facility] Clinical Pharmacist  Phone: [phone] 10/22/2022 10:41 PM  Please check AMION for all [place] phone numbers After 10:00 PM, call [place] [phone]

## 2022-10-23 ENCOUNTER — Inpatient Hospital Stay (HOSPITAL_COMMUNITY): Payer: Medicare HMO

## 2022-10-23 DIAGNOSIS — R570 Cardiogenic shock: Secondary | ICD-10-CM | POA: Diagnosis not present

## 2022-10-23 DIAGNOSIS — J810 Acute pulmonary edema: Secondary | ICD-10-CM | POA: Diagnosis not present

## 2022-10-23 DIAGNOSIS — J9601 Acute respiratory failure with hypoxia: Secondary | ICD-10-CM | POA: Diagnosis not present

## 2022-10-23 DIAGNOSIS — E876 Hypokalemia: Secondary | ICD-10-CM | POA: Diagnosis not present

## 2022-10-23 DIAGNOSIS — I5023 Acute on chronic systolic (congestive) heart failure: Secondary | ICD-10-CM | POA: Diagnosis not present

## 2022-10-23 DIAGNOSIS — R4182 Altered mental status, unspecified: Secondary | ICD-10-CM | POA: Diagnosis not present

## 2022-10-23 DIAGNOSIS — C9201 Acute myeloblastic leukemia, in remission: Secondary | ICD-10-CM

## 2022-10-23 DIAGNOSIS — J9600 Acute respiratory failure, unspecified whether with hypoxia or hypercapnia: Secondary | ICD-10-CM | POA: Diagnosis not present

## 2022-10-23 DIAGNOSIS — I4891 Unspecified atrial fibrillation: Secondary | ICD-10-CM | POA: Diagnosis not present

## 2022-10-23 LAB — CBC
HCT: 32.3 % — ABNORMAL LOW (ref 39.0–52.0)
Hemoglobin: 11.3 g/dL — ABNORMAL LOW (ref 13.0–17.0)
MCH: 29.9 pg (ref 26.0–34.0)
MCHC: 35 g/dL (ref 30.0–36.0)
MCV: 85.4 fL (ref 80.0–100.0)
Platelets: 122 10*3/uL — ABNORMAL LOW (ref 150–400)
RBC: 3.78 MIL/uL — ABNORMAL LOW (ref 4.22–5.81)
RDW: 14.8 % (ref 11.5–15.5)
WBC: 28.7 10*3/uL — ABNORMAL HIGH (ref 4.0–10.5)
nRBC: 8.3 % — ABNORMAL HIGH (ref 0.0–0.2)

## 2022-10-23 LAB — BASIC METABOLIC PANEL
Anion gap: 23 — ABNORMAL HIGH (ref 5–15)
BUN: 71 mg/dL — ABNORMAL HIGH (ref 8–23)
CO2: 26 mmol/L (ref 22–32)
Calcium: 8.2 mg/dL — ABNORMAL LOW (ref 8.9–10.3)
Chloride: 93 mmol/L — ABNORMAL LOW (ref 98–111)
Creatinine, Ser: 5.43 mg/dL — ABNORMAL HIGH (ref 0.61–1.24)
GFR, Estimated: 11 mL/min — ABNORMAL LOW (ref >60)
Glucose, Bld: 153 mg/dL — ABNORMAL HIGH (ref 70–99)
Potassium: 3.6 mmol/L (ref 3.5–5.1)
Sodium: 142 mmol/L (ref 135–145)

## 2022-10-23 LAB — COOXEMETRY PANEL
Carboxyhemoglobin: 1.6 % — ABNORMAL HIGH (ref 0.5–1.5)
Methemoglobin: <0.7 % (ref 0.0–1.5)
O2 Saturation: 75.1 %
Total hemoglobin: 11.1 g/dL — ABNORMAL LOW (ref 12.0–16.0)

## 2022-10-23 LAB — COMPREHENSIVE METABOLIC PANEL
ALT: 5167 U/L — ABNORMAL HIGH (ref 0–44)
AST: 3777 U/L — ABNORMAL HIGH (ref 15–41)
Albumin: 2.7 g/dL — ABNORMAL LOW (ref 3.5–5.0)
Alkaline Phosphatase: 119 U/L (ref 38–126)
Anion gap: 26 — ABNORMAL HIGH (ref 5–15)
BUN: 83 mg/dL — ABNORMAL HIGH (ref 8–23)
CO2: 24 mmol/L (ref 22–32)
Calcium: 7.8 mg/dL — ABNORMAL LOW (ref 8.9–10.3)
Chloride: 92 mmol/L — ABNORMAL LOW (ref 98–111)
Creatinine, Ser: 5.94 mg/dL — ABNORMAL HIGH (ref 0.61–1.24)
GFR, Estimated: 10 mL/min — ABNORMAL LOW (ref >60)
Glucose, Bld: 140 mg/dL — ABNORMAL HIGH (ref 70–99)
Potassium: 3.4 mmol/L — ABNORMAL LOW (ref 3.5–5.1)
Sodium: 142 mmol/L (ref 135–145)
Total Bilirubin: 2.2 mg/dL — ABNORMAL HIGH (ref 0.3–1.2)
Total Protein: 6 g/dL — ABNORMAL LOW (ref 6.5–8.1)

## 2022-10-23 LAB — POCT I-STAT 7, (LYTES, BLD GAS, ICA,H+H)
Acid-Base Excess: 6 mmol/L — ABNORMAL HIGH (ref 0.0–2.0)
Acid-Base Excess: 6 mmol/L — ABNORMAL HIGH (ref 0.0–2.0)
Bicarbonate: 29.6 mmol/L — ABNORMAL HIGH (ref 20.0–28.0)
Bicarbonate: 29.8 mmol/L — ABNORMAL HIGH (ref 20.0–28.0)
Calcium, Ion: 0.95 mmol/L — ABNORMAL LOW (ref 1.15–1.40)
Calcium, Ion: 0.91 mmol/L — ABNORMAL LOW (ref 1.15–1.40)
HCT: 34 % — ABNORMAL LOW (ref 39.0–52.0)
HCT: 35 % — ABNORMAL LOW (ref 39.0–52.0)
Hemoglobin: 11.6 g/dL — ABNORMAL LOW (ref 13.0–17.0)
Hemoglobin: 11.9 g/dL — ABNORMAL LOW (ref 13.0–17.0)
O2 Saturation: 99 %
O2 Saturation: 99 %
Patient temperature: 36.6
Patient temperature: 35.9
Potassium: 3.4 mmol/L — ABNORMAL LOW (ref 3.5–5.1)
Potassium: 3.2 mmol/L — ABNORMAL LOW (ref 3.5–5.1)
Sodium: 139 mmol/L (ref 135–145)
Sodium: 139 mmol/L (ref 135–145)
TCO2: 31 mmol/L (ref 22–32)
TCO2: 31 mmol/L (ref 22–32)
pCO2 arterial: 39.2 mmHg (ref 32–48)
pCO2 arterial: 37.3 mmHg (ref 32–48)
pH, Arterial: 7.485 — ABNORMAL HIGH (ref 7.35–7.45)
pH, Arterial: 7.507 — ABNORMAL HIGH (ref 7.35–7.45)
pO2, Arterial: 118 mmHg — ABNORMAL HIGH (ref 83–108)
pO2, Arterial: 115 mmHg — ABNORMAL HIGH (ref 83–108)

## 2022-10-23 LAB — GLUCOSE, CAPILLARY
Glucose-Capillary: 114 mg/dL — ABNORMAL HIGH (ref 70–99)
Glucose-Capillary: 133 mg/dL — ABNORMAL HIGH (ref 70–99)
Glucose-Capillary: 140 mg/dL — ABNORMAL HIGH (ref 70–99)
Glucose-Capillary: 146 mg/dL — ABNORMAL HIGH (ref 70–99)
Glucose-Capillary: 149 mg/dL — ABNORMAL HIGH (ref 70–99)
Glucose-Capillary: 160 mg/dL — ABNORMAL HIGH (ref 70–99)
Glucose-Capillary: 165 mg/dL — ABNORMAL HIGH (ref 70–99)

## 2022-10-23 LAB — MAGNESIUM: Magnesium: 2.5 mg/dL — ABNORMAL HIGH (ref 1.7–2.4)

## 2022-10-23 LAB — CALCIUM, IONIZED: Calcium, Ionized, Serum: 3.6 mg/dL — ABNORMAL LOW (ref 4.5–5.6)

## 2022-10-23 LAB — APTT: aPTT: 71 seconds — ABNORMAL HIGH (ref 24–36)

## 2022-10-23 LAB — LACTIC ACID, PLASMA
Lactic Acid, Venous: 2.3 mmol/L (ref 0.5–1.9)
Lactic Acid, Venous: 2.1 mmol/L (ref 0.5–1.9)

## 2022-10-23 LAB — HEPARIN LEVEL (UNFRACTIONATED): Heparin Unfractionated: 0.89 IU/mL — ABNORMAL HIGH (ref 0.30–0.70)

## 2022-10-23 MED ORDER — SODIUM CHLORIDE 0.9 % IV SOLN
4.0000 g | Freq: Once | INTRAVENOUS | Status: AC
  Administered 2022-10-23: 4 g via INTRAVENOUS
  Filled 2022-10-23: qty 40

## 2022-10-23 MED ORDER — FUROSEMIDE 10 MG/ML IJ SOLN
120.0000 mg | Freq: Once | INTRAVENOUS | Status: DC
  Filled 2022-10-23: qty 12

## 2022-10-23 MED ORDER — LEVETIRACETAM IN NACL 1000 MG/100ML IV SOLN
1000.0000 mg | Freq: Once | INTRAVENOUS | Status: AC
  Administered 2022-10-23: 1000 mg via INTRAVENOUS
  Filled 2022-10-23: qty 100

## 2022-10-23 MED ORDER — LEVETIRACETAM IN NACL 1000 MG/100ML IV SOLN
1000.0000 mg | Freq: Two times a day (BID) | INTRAVENOUS | Status: DC
  Administered 2022-10-24 – 2022-10-25 (×3): 1000 mg via INTRAVENOUS
  Filled 2022-10-23 (×3): qty 100

## 2022-10-23 MED ORDER — LEVETIRACETAM IN NACL 1000 MG/100ML IV SOLN: 1000.0000 mg | Freq: Two times a day (BID) | INTRAVENOUS | Status: DC

## 2022-10-23 NOTE — Progress Notes (Signed)
Advanced Heart Failure Rounding Note   Subjective:    Remains intubated. Propofol weaned this morning and immediately had seizure-like activity again. EEG confirmed seizure activty.  On NE 14. VP 0.03 Milrinone off due to low SVR  Co-ox 75% Lactic acid >9 -> 5.8 -> 2.3. SCr up to 5.4. Decent urine output with one dose IV lasix   PAP: (20-38)/(14-24) 38/22 CVP:  [5 mmHg-13 mmHg] 10 mmHg PCWP:  [11 mmHg] 11 mmHg CO:  [4.7 L/min-7.8 L/min] 4.7 L/min CI:  [2.2 L/min/m2-3.7 L/min/m2] 2.2 L/min/m2     Objective:   Weight Range:  Vital Signs:   Temp:  [96.8 F (36 C)-98.1 F (36.7 C)] 98.1 F (36.7 C) (11/26 0800) Pulse Rate:  [129-266] 150 (11/26 0800) Resp:  [0-37] 22 (11/26 0800) SpO2:  [84 %-100 %] 94 % (11/26 0800) Arterial Line BP: (90-117)/(48-80) 94/80 (11/25 1600) FiO2 (%):  [50 %] 50 % (11/26 0343) Weight:  [98.3 kg] 98.3 kg (11/26 0500) Last BM Date :  (pta)  Weight change: Filed Weights   10/21/22 0306 10/22/22 0500 10/23/22 0500  Weight: 89.8 kg 97.8 kg 98.3 kg    Intake/Output:   Intake/Output Summary (Last 24 hours) at 10/23/2022 0855 Last data filed at 10/23/2022 0800 Gross per 24 hour  Intake 2911.76 ml  Output 1300 ml  Net 1611.76 ml      Physical Exam: General:  Intubated/unresponsive HEENT: normal + ETT Neck: supple. LIJ swan Carotids 2+ bilat; no bruits. Cor: PMI nondisplaced. Regular rate & rhythm. No rubs, gallops or murmurs. Lungs: clear Abdomen: soft, nontender, nondistended. No hepatosplenomegaly. No bruits or masses. Good bowel sounds. Extremities: no cyanosis, clubbing, rash, tr edema  LFA IABP Neuro: intubated/sedated   Telemetry: AF vs MAT 90s Personally reviewed    Labs: Basic Metabolic Panel: Recent Labs  Lab 10/20/22 1715 10/20/22 1957 10/21/22 0101 10/21/22 0457 10/21/22 0500 10/21/22 0754 10/21/22 1414 10/21/22 1832 10/21/22 2035 10/21/22 2316 10/22/22 0217 10/22/22 0220 10/22/22 1619 10/22/22 1640  10/22/22 1759 10/22/22 2105 10/23/22 0410 10/23/22 0508  NA 138   < > 140   < > 146*   < > 146*   < > 144   < > 142   < > 141 139 140  --  139 142  K 2.8*   < > 3.2*   < > 4.5   < > 3.4*   < > 3.3*   < > 3.3*   < > 3.3* 3.2* 3.3* 3.5 3.4* 3.6  CL 105  --  103  --  101  --  98  --  99  --  93*  --  94*  --   --   --   --  93*  CO2 24  --  16*  --  12*  --  12*  --  19*  --  23  --  25  --   --   --   --  26  GLUCOSE 177*  --  209*  --  139*  --  293*  --  344*  --  338*  --  135*  --   --   --   --  153*  BUN 23  --  25*  --  29*  --  34*  --  40*  --  46*  --  57*  --   --   --   --  71*  CREATININE 0.64  --  1.77*  --  2.44*  --  3.05*  --  3.35*  --  3.88*  --  4.71*  --   --   --   --  5.43*  CALCIUM 7.7*  --  8.1*  --  7.6*  --  6.9*  --  6.9*  --  7.8*  --  8.7*  --   --   --   --  8.2*  MG 1.4*  --  2.3  --  2.6*  --   --   --  1.9  --  2.3  --   --   --   --   --   --   --   PHOS  --   --   --   --   --   --   --   --   --   --  5.1*  --   --   --   --   --   --   --    < > = values in this interval not displayed.     Liver Function Tests: Recent Labs  Lab 10/20/22 1715 10/22/22 0217  AST 53* >10,000*  ALT 59* 5,030*  ALKPHOS 32* 94  BILITOT 1.6* 2.6*  PROT 7.1 5.8*  ALBUMIN 3.2* 2.6*    No results for input(s): "LIPASE", "AMYLASE" in the last 168 hours. No results for input(s): "AMMONIA" in the last 168 hours.  CBC: Recent Labs  Lab 10/20/22 1715 10/20/22 1957 10/21/22 0101 10/21/22 0457 10/21/22 0500 10/21/22 0754 10/21/22 2335 10/22/22 0220 10/22/22 0542 10/22/22 1619 10/22/22 1640 10/22/22 1759 10/23/22 0410  WBC 10.6*  --  19.2*  --  19.9*  --  23.9*  --   --  29.8*  --   --   --   NEUTROABS 7.7  --   --   --   --   --   --   --   --   --   --   --   --   HGB 13.1   < > 14.0   < > 13.4   < > 11.3*   < > 11.6* 11.4* 11.2* 11.2* 11.6*  HCT 37.5*   < > 40.7   < > 39.6   < > 31.6*   < > 34.0* 31.9* 33.0* 33.0* 34.0*  MCV 85.2  --  87.5  --  89.2  --   85.6  --   --  84.6  --   --   --   PLT 236  --  233  --  193  --  175  --   --  138*  --   --   --    < > = values in this interval not displayed.     Cardiac Enzymes: No results for input(s): "CKTOTAL", "CKMB", "CKMBINDEX", "TROPONINI" in the last 168 hours.  BNP: BNP (last 3 results) Recent Labs    10/20/22 1715  BNP 1,300.4*     ProBNP (last 3 results) No results for input(s): "PROBNP" in the last 8760 hours.    Other results:  Imaging: DG CHEST PORT 1 VIEW  Result Date: 10/22/2022 CLINICAL DATA:  Intra-aortic balloon catheter. EXAM: PORTABLE CHEST 1 VIEW COMPARISON:  Same day. FINDINGS: Endotracheal and nasogastric tubes are unchanged in position. Left internal jugular Swan-Ganz catheter is unchanged. Left-sided defibrillator is again noted. Stable position of distal tip of aortic balloon catheter which is in expected position of midportion of descending thoracic aorta. Stable  bibasilar opacities are noted. IMPRESSION: Stable support apparatus as described above. Stable bibasilar opacities. Electronically Signed   By: Marijo Conception M.D.   On: 10/22/2022 16:05   Overnight EEG with video  Result Date: 10/22/2022 Lora Havens, MD     10/23/2022  8:51 AM Patient Name: Brendan Ellis MRN: [id number] Epilepsy Attending: Lora Havens Referring Physician/Provider: Jacky Kindle, MD Duration: 10/21/2022 1844 to 10/22/2022 1844 Patient history: 66yo M s/p cardiac arrest now with myoclonic jerks. EEG to evaluate for seizure.py Level of alertness: comatose AEDs during EEG study: LEV, propofol Technical aspects: This EEG study was done with scalp electrodes positioned according to the 10-20 International system of electrode placement. Electrical activity was reviewed with band pass filter of 1-'70Hz'$ , sensitivity of 7 uV/mm, display speed of 37m/sec with a '60Hz'$  notched filter applied as appropriate. EEG data were recorded continuously and digitally stored.  Video monitoring was  available and reviewed as appropriate. Description: EEG showed continuous generalized 5 to 8 Hz theta-alpha activity admixed with intermittent generalized 2-'3hz'$  delta slowing. There is also 15 to 18 Hz beta activity distributed symmetrically and diffusely.  Hyperventilation and photic stimulation were not performed.   Event button was pressed on 10/22/2022 at 0941 during which patient was noted to have coughing and jerking of bilateral upper extremities after suctioning. Concomitant eeg before, during and after the event didn't show any eeg change to suggest seizure. ABNORMALITY - Continuous slow, generalized IMPRESSION: This study is suggestive of severe diffuse encephalopathy, nonspecific etiology. No seizures or epileptiform discharges were seen throughout the recording. Event button was pressed on 10/22/2022 at 0941 during which patient was noted to have coughing and jerking of bilateral upper extremities after suctioning without concomitant eeg change. This was most likely not an epileptic event. PLora Havens  DG CHEST PORT 1 VIEW  Result Date: 10/22/2022 CLINICAL DATA:  Intra-aortic balloon pump. EXAM: PORTABLE CHEST 1 VIEW COMPARISON:  October 21, 2022. FINDINGS: Stable cardiomegaly. Endotracheal and nasogastric tubes are unchanged in position. Left internal jugular Swan-Ganz catheter is noted with tip in right pulmonary artery. Left-sided defibrillator is unchanged in position. Stable bilateral lung opacities are noted. Distal tip of intra-aortic balloon catheter is seen in expected position midportion of descending thoracic aorta, which is significantly inferior to its location on prior radiograph. IMPRESSION: Distal tip of intra-aortic balloon catheter is seen in the expected position of midportion of descending thoracic aorta, which is significantly inferior to its location on prior radiograph November 24th. These results will be called to the ordering clinician or representative by the  Radiologist Assistant, and communication documented in the PACS or zVision Dashboard. Otherwise stable support apparatus.  Stable bibasilar opacities. Electronically Signed   By: JMarijo ConceptionM.D.   On: 10/22/2022 08:23   CT HEAD WO CONTRAST (5MM)  Result Date: 10/21/2022 CLINICAL DATA:  Seizure disorder, clinical change EXAM: CT HEAD WITHOUT CONTRAST TECHNIQUE: Contiguous axial images were obtained from the base of the skull through the vertex without intravenous contrast. RADIATION DOSE REDUCTION: This exam was performed according to the departmental dose-optimization program which includes automated exposure control, adjustment of the mA and/or kV according to patient size and/or use of iterative reconstruction technique. COMPARISON:  None Available. FINDINGS: Brain: No evidence of acute infarction, hemorrhage, hydrocephalus, extra-axial collection or mass lesion/mass effect. Mild periventricular white matter hypodensity. Vascular: No hyperdense vessel or unexpected calcification. Skull: Normal. Negative for fracture or focal lesion. Sinuses/Orbits: No acute finding. Other: None. IMPRESSION: No acute  intracranial pathology. Small-vessel white matter disease. Electronically Signed   By: Delanna Ahmadi M.D.   On: 10/21/2022 16:59   ECHOCARDIOGRAM COMPLETE  Result Date: 10/21/2022    ECHOCARDIOGRAM REPORT   Patient Name:   NEALY Ellis Date of Exam: 10/21/2022 Medical Rec #:  [id number]     Height:       74.0 in Accession #:    [id number]    Weight:       198.0 lb Date of Birth:  1955/12/26     BSA:          2.164 m Patient Age:    35 years      BP:           86/64 mmHg Patient Gender: M             HR:           69 bpm. Exam Location:  Inpatient Procedure: 2D Echo, Cardiac Doppler and Color Doppler Indications:    CHF-Acute Systolic Y69.48  History:        Patient has prior history of Echocardiogram examinations, most                 recent 04/29/2018. Cardiomyopathy, Defibrillator,                  Arrythmias:Atrial Fibrillation; Risk Factors:Hypertension. IABP.  Sonographer:    Darlina Sicilian RDCS Referring Phys: [id number] [place]  1. Left ventricular ejection fraction, by estimation, is 20%. The left ventricle has severely decreased function. The left ventricle demonstrates global hypokinesis. The left ventricular internal cavity size was mildly dilated. Left ventricular diastolic parameters are indeterminate.  2. Right ventricular systolic function is moderately reduced. The right ventricular size is moderately enlarged. There is normal pulmonary artery systolic pressure. The estimated right ventricular systolic pressure is 50.0 mmHg.  3. Right atrial size was severely dilated.  4. The mitral valve is grossly normal. No evidence of mitral valve regurgitation. No evidence of mitral stenosis.  5. The aortic valve is tricuspid. There is mild calcification of the aortic valve. Aortic valve regurgitation is not visualized. No aortic stenosis is present.  6. The inferior vena cava is dilated in size with <50% respiratory variability, suggesting right atrial pressure of 15 mmHg.  7. There appears to be a pulsating catheter in the aorta (IABP). Comparison(s): EF is worse than prior reported. FINDINGS  Left Ventricle: Left ventricular ejection fraction, by estimation, is 20%. The left ventricle has severely decreased function. The left ventricle demonstrates global hypokinesis. The left ventricular internal cavity size was mildly dilated. There is no left ventricular hypertrophy. Left ventricular diastolic parameters are indeterminate. Right Ventricle: The right ventricular size is moderately enlarged. No increase in right ventricular wall thickness. Right ventricular systolic function is moderately reduced. There is normal pulmonary artery systolic pressure. The tricuspid regurgitant velocity is 2.23 m/s, and with an assumed right atrial pressure of 15 mmHg, the estimated right ventricular systolic  pressure is 93.8 mmHg. Left Atrium: Left atrial size was normal in size. Right Atrium: Right atrial size was severely dilated. Pericardium: Trivial pericardial effusion is present. Mitral Valve: The mitral valve is grossly normal. No evidence of mitral valve regurgitation. No evidence of mitral valve stenosis. Tricuspid Valve: The tricuspid valve is grossly normal. Tricuspid valve regurgitation is trivial. Aortic Valve: The aortic valve is tricuspid. There is mild calcification of the aortic valve. Aortic valve regurgitation is not visualized. No aortic stenosis is present. Pulmonic Valve: The  pulmonic valve was grossly normal. Pulmonic valve regurgitation is mild. No evidence of pulmonic stenosis. Aorta: The aortic root and ascending aorta are structurally normal, with no evidence of dilitation. Venous: The inferior vena cava is dilated in size with less than 50% respiratory variability, suggesting right atrial pressure of 15 mmHg. IAS/Shunts: No atrial level shunt detected by color flow Doppler. Additional Comments: A venous catheter is visualized in the right ventricle and right atrium.  LEFT VENTRICLE PLAX 2D LVIDd:         5.60 cm      Diastology LVIDs:         5.20 cm      LV e' medial:    7.58 cm/s LV PW:         0.80 cm      LV E/e' medial:  9.8 LV IVS:        1.10 cm      LV e' lateral:   11.30 cm/s LVOT diam:     2.30 cm      LV E/e' lateral: 6.6 LV SV:         71 LV SV Index:   33 LVOT Area:     4.15 cm  LV Volumes (MOD) LV vol d, MOD A2C: 251.0 ml LV vol d, MOD A4C: 202.0 ml LV vol s, MOD A2C: 127.0 ml LV vol s, MOD A4C: 130.0 ml LV SV MOD A2C:     124.0 ml LV SV MOD A4C:     202.0 ml LV SV MOD BP:      102.3 ml RIGHT VENTRICLE RV Basal diam:  5.60 cm RV Mid diam:    3.80 cm RV S prime:     9.20 cm/s TAPSE (M-mode): 1.2 cm LEFT ATRIUM             Index        RIGHT ATRIUM           Index LA diam:        3.30 cm 1.52 cm/m   RA Area:     29.00 cm LA Vol (A2C):   82.6 ml 38.19 ml/m  RA Volume:   114.00  ml 52.68 ml/m LA Vol (A4C):   59.0 ml 27.26 ml/m LA Biplane Vol: 64.5 ml 29.80 ml/m  AORTIC VALVE             PULMONIC VALVE LVOT Vmax:   98.00 cm/s  PR End Diast Vel: 5.48 msec LVOT Vmean:  72.250 cm/s LVOT VTI:    0.171 m  AORTA Ao Root diam: 2.80 cm Ao Asc diam:  3.20 cm MITRAL VALVE               TRICUSPID VALVE MV Area (PHT): 4.64 cm    TR Peak grad:   19.9 mmHg MV Decel Time: 164 msec    TR Vmax:        223.00 cm/s MV E velocity: 74.55 cm/s                            SHUNTS                            Systemic VTI:  0.17 m                            Systemic Diam: 2.30 cm Rudean Haskell MD  Electronically signed by Rudean Haskell MD Signature Date/Time: 10/21/2022/2:27:05 PM    Final    Port CXR  Result Date: 10/21/2022 CLINICAL DATA:  Central line placement. EXAM: PORTABLE CHEST 1 VIEW COMPARISON:  Same day. FINDINGS: Interval placement of left internal jugular Swan-Ganz catheter with tip directed into right pulmonary artery. No pneumothorax is noted. Stable bibasilar opacities no other support apparatus is noted. IMPRESSION: Interval placement of left internal jugular Swan-Ganz catheter with tip directed into right pulmonary artery. Electronically Signed   By: Marijo Conception M.D.   On: 10/21/2022 13:17     Medications:     Scheduled Medications:  Chlorhexidine Gluconate Cloth  6 each Topical Daily   docusate  100 mg Per Tube BID   hydrocortisone sod succinate (SOLU-CORTEF) inj  100 mg Intravenous Q12H   insulin aspart  0-15 Units Subcutaneous Q4H   insulin glargine-yfgn  20 Units Subcutaneous Daily   mouth rinse  15 mL Mouth Rinse Q2H   pantoprazole (PROTONIX) IV  40 mg Intravenous Daily   polyethylene glycol  17 g Per Tube Daily   sodium bicarbonate  100 mEq Intravenous Once   sodium chloride flush  3 mL Intravenous Q12H   sodium chloride flush  3 mL Intravenous Q12H    Infusions:  sodium chloride 10 mL/hr at 10/23/22 0800   sodium chloride 20 mL/hr at 10/23/22 0800    sodium chloride 10 mL/hr at 10/23/22 0800   sodium chloride     amiodarone 30 mg/hr (10/23/22 0800)   epinephrine Stopped (10/22/22 0245)   furosemide     heparin 1,400 Units/hr (10/23/22 0831)   levETIRAcetam Stopped (10/23/22 0443)   milrinone Stopped (10/22/22 0854)   norepinephrine (LEVOPHED) Adult infusion 14 mcg/min (10/23/22 0800)   piperacillin-tazobactam (ZOSYN)  IV Stopped (10/23/22 0202)   propofol (DIPRIVAN) infusion 30 mcg/kg/min (10/23/22 0800)   vasopressin 0.03 Units/min (10/23/22 0800)    PRN Medications: sodium chloride, sodium chloride, acetaminophen **OR** acetaminophen, artificial tears, fentaNYL (SUBLIMAZE) injection, [START ON 10/24/2022] influenza vac split quadrivalent PF, LORazepam, midazolam, ondansetron (ZOFRAN) IV, ondansetron **OR** [DISCONTINUED] ondansetron (ZOFRAN) IV, mouth rinse, [START ON 10/24/2022] pneumococcal 20-valent conjugate vaccine, senna-docusate, sodium chloride flush   Assessment/Plan:   1. 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 EF 35% in 2020. S/p Biotronik ICD - EF 35% -> 20% in setting of AF - Echo 10/21/22 EF 20% RV moderately reduced - S/p PEA arrest 11/23 - Now on IABP, NE 14, VP 0.03.off milrinone due to low SVR. Co-ox 75% - Lactate > 9.0 -> 5.8 -> 2.3 - Continue supportive care  2. Anoxic brain injury - having seizures/myoclonus -> propofol/Keppra - Brain CT 10/21/22 - normal - Neuro following. I d/w them today at bedside. Will repeat CT today. Unable to get MRI due to ICD/IABP - Suspect poor prognosis  3. PEA arrest - 10/20/22 - CPR x 6 mins  4. AF, persistent - likely cause of tachy CM - was in NSR on 11/24 now in AF/MAT but rate improved - Continue heparin/IV amio  5. AKI - due to ATN/shock - SCr 0.8 -> 3.88 -> 5.43 - Continue hemodynamic support - Consider CRRT pending Neuro prognostication. Currently not acidotic or markedly volume overloaded so can  wait one more day and reassess. D/w CCM  6. Acute hypoxic respiratory failure - due to HF/cardiac arrest - remains intubated  7. Shock liver - continue supportive care  8. Hypocalcemia/hypokalemia - supp  9. H/o AML  - s/p chemo 2019  CRITICAL CARE Performed by: Glori Bickers  Total critical care time: 40 minutes  Critical care time was exclusive of separately billable procedures and treating other patients.  Critical care was necessary to treat or prevent imminent or life-threatening deterioration.  Critical care was time spent personally by me (independent of midlevel providers or residents) on the following activities: development of treatment plan with patient and/or surrogate as well as nursing, discussions with consultants, evaluation of patient's response to treatment, examination of patient, obtaining history from patient or surrogate, ordering and performing treatments and interventions, ordering and review of laboratory studies, ordering and review of radiographic studies, pulse oximetry and re-evaluation of patient's condition.    Length of Stay: 3   Glori Bickers MD 10/23/2022, 8:55 AM  Advanced Heart Failure Team Pager [phone] (M-F; [place])  Please contact [facility] for night-coverage after hours (4p -7a ) and weekends on [email or web address]

## 2022-10-23 NOTE — Progress Notes (Signed)
NAME:  Brendan Ellis, MRN:  [id number], DOB:  May 14, 1956, LOS: 3 ADMISSION DATE:  10/20/2022, CONSULTATION DATE:  10/21/2022 REFERRING MD:  Dr. Posey Pronto, [facility], CHIEF COMPLAINT:  Short of breath   History of Present Illness:  66 yo male former smoker developed cough, sore throat and dyspnea about 1 week prior to admission.  Started on omnicef by his PCP, but symptoms progressed.  He presented to [facility] on 10/20/22.  He is followed by cardiology in [facility] for chemo induced CM and a fib.  He had A fib with RVR in the ER and started on amiodarone.  CT angio chest 2.2 x 1.8 cm pretracheal LN, small chronic loculated effusions, consolidative ASD in lower lobes, and interlobular septal thickening.  Started on IV ABx, diuretics and supplemental oxygen.  He was tried on Bipap but wasn't able to tolerate this.  PCCM consulted to arrange for management in ICU.  Pertinent  Medical History  Non ischemic dilated CM with HFrEF, s/p AICD, PAF on eliquis, Leukemia 2019, HTN  Significant Hospital Events: Including procedures, antibiotic start and stop dates in addition to other pertinent events   11/23 Admit 11/24 Transfer to ICU  Interim History / Subjective:  Patient remains intubated on multiple vasopressors Continues to have myoclonic seizures, confirmed with EEG Repeat CT head today again showed no acute abnormalities  Objective   Blood pressure (!) 88/65, pulse (!) 112, temperature (!) 97 F (36.1 C), resp. rate 14, height '6\' 2"'$  (1.88 m), weight 98.3 kg, SpO2 100 %. PAP: (20-45)/(14-27) 45/27 CVP:  [5 mmHg-20 mmHg] 20 mmHg PCWP:  [11 mmHg] 11 mmHg CO:  [4.7 L/min-7 L/min] 5 L/min CI:  [2.2 L/min/m2-3.3 L/min/m2] 2.4 L/min/m2  Vent Mode: PRVC FiO2 (%):  [40 %-50 %] 40 % Set Rate:  [14 bmp] 14 bmp Vt Set:  [650 mL] 650 mL PEEP:  [5 cmH20] 5 cmH20 Plateau Pressure:  [18 cmH20-20 cmH20] 18 cmH20   Intake/Output Summary (Last 24 hours) at 10/23/2022 1601 Last data filed at 10/23/2022 1500 Gross per  24 hour  Intake 2638.8 ml  Output 1965 ml  Net 673.8 ml   Filed Weights   10/21/22 0306 10/22/22 0500 10/23/22 0500  Weight: 89.8 kg 97.8 kg 98.3 kg    Examination:   Physical exam: General: Crtitically ill-appearing male, orally intubated HEENT: [place]/AT, eyes anicteric.  ETT and OGT in place Neuro: Eyes closed, does not open, not following commands, pupils 2 mm bilateral nonreactive to light, cough and gag present.  Stimulus induced myoclonus noted Chest: Coarse breath sounds, no wheezes or rhonchi Heart: Irregularly irregular, no murmurs or gallops Abdomen: Soft, nondistended, bowel sounds present Skin: No rash   Resolved Hospital Problem list     Assessment & Plan:  Acute hypoxic respiratory failure with aspiration pneumonia and pulmonary edema Septic shock Continue antibiotics with Zosyn Continue lung protective ventilation VAP bundle in place PAD protocol with propofol FiO2 was titrated down to 50% Cultures have been negative Vasopressor requirement is improving Continue stress dose steroids  Paroxysmal A fib with RVR. 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  Prognosis guarded  Best Practice (right click and "Reselect all SmartList Selections" daily)   Diet/type: NPO DVT prophylaxis: Systemic heparin GI prophylaxis: PPI Lines: Yes needed Foley: Yes still needed Code Status:  full code Last date of multidisciplinary goals of care discussion [11/26 patient's family was updated   Labs   CBC: Recent Labs  Lab 10/20/22 1715 10/20/22 1957 10/21/22 0101 10/21/22 0457 10/21/22 0500 10/21/22 0754 10/21/22 2335 10/22/22 0220 10/22/22 0542 10/22/22 1619 10/22/22 1640 10/22/22 1759 10/23/22 0410  WBC 10.6*  --  19.2*  --  19.9*  --  23.9*  --   --  29.8*  --   --   --   NEUTROABS 7.7  --   --   --   --   --   --   --   --   --   --   --   --   HGB 13.1   < > 14.0   < > 13.4   < > 11.3*   < > 11.6* 11.4* 11.2* 11.2* 11.6*  HCT 37.5*   < > 40.7   < > 39.6   < > 31.6*   < > 34.0* 31.9* 33.0* 33.0* 34.0*  MCV 85.2  --  87.5  --  89.2  --  85.6  --   --  84.6  --   --   --   PLT 236  --  233  --  193  --  175  --   --  138*  --   --   --    < > = values in this interval not displayed.    Basic Metabolic Panel: Recent Labs  Lab 10/20/22 1715 10/20/22 1957 10/21/22 0101 10/21/22 0457 10/21/22 0500 10/21/22 0754 10/21/22 1414 10/21/22 1832 10/21/22 2035 10/21/22 2316  10/22/22 0217 10/22/22 0220 10/22/22 1619 10/22/22 1640 10/22/22 1759 10/22/22 2105 10/23/22 0410 10/23/22 0508  NA 138   < > 140   < > 146*   < > 146*   < > 144   < > 142   < > 141 139 140  --  139 142  K 2.8*   < > 3.2*   < > 4.5   < > 3.4*   < > 3.3*   < > 3.3*   < > 3.3* 3.2* 3.3* 3.5 3.4* 3.6  CL 105  --  103  --  101  --  98  --  99  --  93*  --  94*  --   --   --   --  93*  CO2 24  --  16*  --  12*  --  12*  --  19*  --  23  --  25  --   --   --   --  26  GLUCOSE 177*  --  209*  --  139*  --  293*  --  344*  --  338*  --  135*  --   --   --   --  153*  BUN 23  --  25*  --  29*  --  34*  --  40*  --  46*  --  57*  --   --   --   --  71*  CREATININE 0.64  --  1.77*  --  2.44*  --  3.05*  --  3.35*  --  3.88*  --  4.71*  --   --   --   --  5.43*  CALCIUM 7.7*  --  8.1*  --  7.6*  --  6.9*  --  6.9*  --  7.8*  --  8.7*  --   --   --   --  8.2*  MG 1.4*  --  2.3  --  2.6*  --   --   --  1.9  --  2.3  --   --   --   --   --   --   --   PHOS  --   --   --   --   --   --   --   --   --   --  5.1*  --   --   --   --   --   --   --    < > = values in this interval not displayed.   GFR: Estimated Creatinine Clearance: 16 mL/min (A) (by C-G formula based on SCr of 5.43 mg/dL (H)). Recent Labs  Lab 10/21/22 0101 10/21/22 0215 10/21/22 0500 10/21/22 0631 10/21/22 2335 10/21/22 2342 10/22/22 0541 10/22/22 1619 10/23/22 0555  WBC 19.2*  --  19.9*  --  23.9*  --   --  29.8*  --   LATICACIDVEN 8.2*   < >  --    < >  --  >9.0* 5.8* 3.1* 2.3*   < > = values in this interval not displayed.    Liver Function Tests: Recent Labs  Lab 10/20/22 1715 10/22/22 0217  AST 53* >10,000*  ALT 59* 5,030*  ALKPHOS 32* 94  BILITOT 1.6* 2.6*  PROT 7.1 5.8*  ALBUMIN 3.2* 2.6*   No results for input(s): "LIPASE", "AMYLASE" in the last 168 hours. No results for input(s): "AMMONIA" in the last 168 hours.  ABG    Component Value Date/Time   PHART 7.485 (H) 10/23/2022 0410   PCO2ART 39.2  10/23/2022 0410   PO2ART 118 (H) 10/23/2022 0410   HCO3 29.6 (H) 10/23/2022 0410   TCO2 31 10/23/2022 0410   ACIDBASEDEF 1.0 10/21/2022 2017   O2SAT 75.1 10/23/2022 0508     Coagulation Profile: No results for input(s): "INR", "PROTIME" in the last 168 hours.  Cardiac Enzymes: No results for input(s): "CKTOTAL", "CKMB", "CKMBINDEX", "TROPONINI" in the last 168 hours.  HbA1C: Hgb A1c MFr Bld  Date/Time Value Ref Range Status  10/21/2022 01:52 AM 6.5 (H) 4.8 - 5.6 % Final    Comment:    (NOTE)         Prediabetes: 5.7 - 6.4  Diabetes: >6.4         Glycemic control for adults with diabetes: <7.0   12/18/2020 10:03 PM >15.5 (H) 4.8 - 5.6 % Final    Comment:    (NOTE) **Verified by repeat analysis**         Prediabetes: 5.7 - 6.4         Diabetes: >6.4         Glycemic control for adults with diabetes: <7.0     CBG: Recent Labs  Lab 10/22/22 2359 10/23/22 0413 10/23/22 0746 10/23/22 1136 10/23/22 1534  GLUCAP 160* 165* 149* 146* 133*    Total critical care time: 38 minutes  Performed by: [place] care time was exclusive of separately billable procedures and treating other patients.   Critical care was necessary to treat or prevent imminent or life-threatening deterioration.   Critical care was time spent personally by me on the following activities: development of treatment plan with patient and/or surrogate as well as nursing, discussions with consultants, evaluation of patient's response to treatment, examination of patient, obtaining history from patient or surrogate, ordering and performing treatments and interventions, ordering and review of laboratory studies, ordering and review of radiographic studies, pulse oximetry and re-evaluation of patient's condition.   Jacky Kindle, MD [facility] See Amion for pager If no response to pager, please call [phone] until 7pm After 7pm, Please call E-link [phone]

## 2022-10-23 NOTE — Progress Notes (Signed)
ANTICOAGULATION CONSULT NOTE  Pharmacy Consult for heparin Indication:  IABP + atrial flutter  No Known Allergies  Patient Measurements: Height: '6\' 2"'$  (188 cm) Weight: 98.3 kg (216 lb 11.4 oz) IBW/kg (Calculated) : 82.2 Heparin Dosing Weight: 88 kg  Vital Signs: Temp: 98.1 F (36.7 C) (11/26 0645)  Labs: Recent Labs    10/20/22 1715 10/20/22 1932 10/20/22 1957 10/21/22 0500 10/21/22 0754 10/21/22 2335 10/22/22 0217 10/22/22 0220 10/22/22 1619 10/22/22 1640 10/22/22 1759 10/22/22 2103 10/23/22 0410 10/23/22 0508  HGB 13.1  --    < > 13.4   < > 11.3*  --    < > 11.4* 11.2* 11.2*  --  11.6*  --   HCT 37.5*  --    < > 39.6   < > 31.6*  --    < > 31.9* 33.0* 33.0*  --  34.0*  --   PLT 236  --    < > 193  --  175  --   --  138*  --   --   --   --   --   APTT  --   --   --   --   --  46*  --    < > 57*  --   --  68*  --  71*  HEPARINUNFRC  --   --   --   --   --  1.07*  --   --   --   --   --   --   --  0.89*  CREATININE 0.64  --    < > 2.44*   < >  --  3.88*  --  4.71*  --   --   --   --  5.43*  TROPONINIHS 6 6  --   --   --   --   --   --   --   --   --   --   --   --    < > = values in this interval not displayed.     Estimated Creatinine Clearance: 16 mL/min (A) (by C-G formula based on SCr of 5.43 mg/dL (H)).   Medical History: Past Medical History:  Diagnosis Date   Hypertension    Leukemia ([place])    PAF (paroxysmal atrial fibrillation) ([place]) 04/27/2018    Medications:  Eliquis 5 mg BID PTA   Assessment: Mr. Saran presented with increasing dyspnea on exertion for the past 2 weeks. Was found to be in atrial flutter with RVR on initial EKG. Due to worsening respiratory status, he was intubated overnight and started on vasopressor support. He went into PEA arrest and required 6 minutes of CPR and epi prior to ROSC. Given cardiogenic shock, he was taken to the cath lab this morning and an IABP was placed for further support. He has been taking Eliquis PTA for  atrial flutter and last dose was 11/23 at 0900. Pharmacy has been consulted for heparin dosing.   aPTT is therapeutic at 71 this morning, on 1400 units/hr. Heparin level does not yet correlate, but is trending down. Hgb stable at 11.6, plts stable. No s/sx of bleeding or infusion issues.   Goal of Therapy:  Heparin level 0.3-0.5 units/ml aPTT 66-85 seconds Monitor platelets by anticoagulation protocol: Yes   Plan:  Continue heparin infusion at 1400 units/hr. Obtain heparin level and aPTT daily Monitor heparin level, aPTT, CBC, renal function, and s/sx of bleeding daily.  Thank you for allowing  pharmacy to participate in this patient's care.  Reatha Harps, PharmD PGY2 Pharmacy Resident 10/23/2022 7:16 AM Check [email or web address] for unit specific pharmacy number

## 2022-10-23 NOTE — Progress Notes (Signed)
Subjective: He had a clear seizure episode this morning.  Exam: Vitals:   10/23/22 1300 10/23/22 1400  BP:    Pulse: (!) 260 (!) 212  Resp: 15 14  Temp: (!) 97 F (36.1 C) (!) 97.2 F (36.2 C)  SpO2: 100% 100%   Gen: In bed, intubated  Neuro: MS: He does not open eyes or follow commands CN: Pupils are reactive bilaterally, corneals are weak but present, doll's eye interpretation is difficult given severe conjunctival edema Motor: In response to noxious stimulation, he has focal myoclonic appearing activity of the left arm to the left nailbed pressure, twitching of each foot in response to noxious stimulation of that same extremity Sensory: As above   Pertinent Labs: Ionized calcium 0.95, stable  Impression:  Impression: 66 year old male 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 Even though his downtime was on the shorter side, given the severe hypoxemia before hand and severe shock that has been problematic afterwards, it is very possible that he has had some degree of injury.   His calcium has normalized, make me think that hypocalcemia is a significant contribution to his current twitching/seizure is much less likely.  At this point it is entirely possible that he has had a fairly severe anoxic injury, but without definitive signs and with potential confounders, would favor continued supportive care for now, with reevaluation in 72 hours.  If continues to have no significant motor response at 72 hours, this can be a poor prognostic indicator.  Recommendations: 1) continue treatment of hypocalcemia per CCM 2) increase Keppra to 1 g twice daily  3) continue EEG   This patient is critically ill and at significant risk of neurological worsening, death and care requires constant monitoring of vital signs, hemodynamics,respiratory and cardiac monitoring, neurological assessment, discussion with family, other specialists and medical decision making of high complexity. I spent 33  minutes of neurocritical care time  in the care of  this patient. This was time spent independent of any time provided by nurse practitioner or PA.  Roland Rack, MD [facility] [phone]  If 7pm- 7am, please page neurology on call as listed in [place]. 10/23/2022  2:09 PM

## 2022-10-23 NOTE — Procedures (Addendum)
Patient Name: Brendan Ellis  MRN: [id number]  Epilepsy Attending: Lora Havens  Referring Physician/Provider: Jacky Kindle, MD  Duration: 10/22/2022 1844 to 10/23/2022 1844   Patient history: 66yo M s/p cardiac arrest now with myoclonic jerks. EEG to evaluate for seizure.   Level of alertness: comatose   AEDs during EEG study: LEV, propofol   Technical aspects: This EEG study was done with scalp electrodes positioned according to the 10-20 International system of electrode placement. Electrical activity was reviewed with band pass filter of 1-'70Hz'$ , sensitivity of 7 uV/mm, display speed of 44m/sec with a '60Hz'$  notched filter applied as appropriate. EEG data were recorded continuously and digitally stored.  Video monitoring was available and reviewed as appropriate.   Description: EEG showed continuous generalized 8-'9hz'$  alpha activity which was not reactive to stimulation. .     Event button was pressed on 10/23/2022 at 0755 during which patient was noted to have coughing and jerking of bilateral upper extremities after suctioning. Concomitant eeg showed generalized polyspikes consistent with myoclonic seizure. Jerking ended at [place]    ABNORMALITY - Alpha coma - Myoclonic seizure, generalized   IMPRESSION: This study is suggestive of severe diffuse encephalopathy, nonspecific etiology but likely due to anoxic -hypoxic brain injury.     Event button was pressed on 10/23/2022 at 0755 during which patient was noted to have coughing and jerking of bilateral upper extremities after suctioning. Concomitant eeg showed generalized polyspikes consistent with myoclonic seizure lasting for about 5.5 minutes.    Brendan Ellis

## 2022-10-23 NOTE — Progress Notes (Signed)
Patient transported to CT & back to room 2H14 on the ventilator with no problems.

## 2022-10-24 ENCOUNTER — Encounter (HOSPITAL_COMMUNITY): Payer: Self-pay | Admitting: Internal Medicine

## 2022-10-24 ENCOUNTER — Inpatient Hospital Stay (HOSPITAL_COMMUNITY): Payer: Medicare HMO

## 2022-10-24 DIAGNOSIS — R570 Cardiogenic shock: Secondary | ICD-10-CM | POA: Diagnosis not present

## 2022-10-24 DIAGNOSIS — Z9911 Dependence on respirator [ventilator] status: Secondary | ICD-10-CM | POA: Diagnosis not present

## 2022-10-24 DIAGNOSIS — I4891 Unspecified atrial fibrillation: Secondary | ICD-10-CM | POA: Diagnosis not present

## 2022-10-24 DIAGNOSIS — J810 Acute pulmonary edema: Secondary | ICD-10-CM | POA: Diagnosis not present

## 2022-10-24 DIAGNOSIS — J9600 Acute respiratory failure, unspecified whether with hypoxia or hypercapnia: Secondary | ICD-10-CM | POA: Diagnosis not present

## 2022-10-24 DIAGNOSIS — R4182 Altered mental status, unspecified: Secondary | ICD-10-CM | POA: Diagnosis not present

## 2022-10-24 DIAGNOSIS — J9601 Acute respiratory failure with hypoxia: Secondary | ICD-10-CM | POA: Diagnosis not present

## 2022-10-24 LAB — CBC
HCT: 32.8 % — ABNORMAL LOW (ref 39.0–52.0)
Hemoglobin: 11.5 g/dL — ABNORMAL LOW (ref 13.0–17.0)
MCH: 29.9 pg (ref 26.0–34.0)
MCHC: 35.1 g/dL (ref 30.0–36.0)
MCV: 85.2 fL (ref 80.0–100.0)
Platelets: 118 10*3/uL — ABNORMAL LOW (ref 150–400)
RBC: 3.85 MIL/uL — ABNORMAL LOW (ref 4.22–5.81)
RDW: 14.8 % (ref 11.5–15.5)
WBC: 26.4 10*3/uL — ABNORMAL HIGH (ref 4.0–10.5)
nRBC: 13.2 % — ABNORMAL HIGH (ref 0.0–0.2)

## 2022-10-24 LAB — POCT I-STAT 7, (LYTES, BLD GAS, ICA,H+H)
Acid-Base Excess: 6 mmol/L — ABNORMAL HIGH (ref 0.0–2.0)
Bicarbonate: 28.9 mmol/L — ABNORMAL HIGH (ref 20.0–28.0)
Calcium, Ion: 0.94 mmol/L — ABNORMAL LOW (ref 1.15–1.40)
HCT: 34 % — ABNORMAL LOW (ref 39.0–52.0)
Hemoglobin: 11.6 g/dL — ABNORMAL LOW (ref 13.0–17.0)
O2 Saturation: 99 %
Patient temperature: 36.1
Potassium: 3.3 mmol/L — ABNORMAL LOW (ref 3.5–5.1)
Sodium: 138 mmol/L (ref 135–145)
TCO2: 30 mmol/L (ref 22–32)
pCO2 arterial: 35 mmHg (ref 32–48)
pH, Arterial: 7.521 — ABNORMAL HIGH (ref 7.35–7.45)
pO2, Arterial: 115 mmHg — ABNORMAL HIGH (ref 83–108)

## 2022-10-24 LAB — COMPREHENSIVE METABOLIC PANEL
ALT: 4555 U/L — ABNORMAL HIGH (ref 0–44)
AST: 2247 U/L — ABNORMAL HIGH (ref 15–41)
Albumin: 2.6 g/dL — ABNORMAL LOW (ref 3.5–5.0)
Alkaline Phosphatase: 104 U/L (ref 38–126)
Anion gap: 27 — ABNORMAL HIGH (ref 5–15)
BUN: 96 mg/dL — ABNORMAL HIGH (ref 8–23)
CO2: 25 mmol/L (ref 22–32)
Calcium: 8.5 mg/dL — ABNORMAL LOW (ref 8.9–10.3)
Chloride: 88 mmol/L — ABNORMAL LOW (ref 98–111)
Creatinine, Ser: 6.29 mg/dL — ABNORMAL HIGH (ref 0.61–1.24)
GFR, Estimated: 9 mL/min — ABNORMAL LOW (ref >60)
Glucose, Bld: 130 mg/dL — ABNORMAL HIGH (ref 70–99)
Potassium: 3.4 mmol/L — ABNORMAL LOW (ref 3.5–5.1)
Sodium: 140 mmol/L (ref 135–145)
Total Bilirubin: 2.2 mg/dL — ABNORMAL HIGH (ref 0.3–1.2)
Total Protein: 5.9 g/dL — ABNORMAL LOW (ref 6.5–8.1)

## 2022-10-24 LAB — GLUCOSE, CAPILLARY
Glucose-Capillary: 138 mg/dL — ABNORMAL HIGH (ref 70–99)
Glucose-Capillary: 146 mg/dL — ABNORMAL HIGH (ref 70–99)
Glucose-Capillary: 122 mg/dL — ABNORMAL HIGH (ref 70–99)
Glucose-Capillary: 104 mg/dL — ABNORMAL HIGH (ref 70–99)
Glucose-Capillary: 119 mg/dL — ABNORMAL HIGH (ref 70–99)

## 2022-10-24 LAB — BASIC METABOLIC PANEL
Anion gap: 25 — ABNORMAL HIGH (ref 5–15)
BUN: 109 mg/dL — ABNORMAL HIGH (ref 8–23)
CO2: 26 mmol/L (ref 22–32)
Calcium: 8.3 mg/dL — ABNORMAL LOW (ref 8.9–10.3)
Chloride: 92 mmol/L — ABNORMAL LOW (ref 98–111)
Creatinine, Ser: 6.48 mg/dL — ABNORMAL HIGH (ref 0.61–1.24)
GFR, Estimated: 9 mL/min — ABNORMAL LOW (ref >60)
Glucose, Bld: 118 mg/dL — ABNORMAL HIGH (ref 70–99)
Potassium: 3.7 mmol/L (ref 3.5–5.1)
Sodium: 143 mmol/L (ref 135–145)

## 2022-10-24 LAB — COOXEMETRY PANEL
Carboxyhemoglobin: 2.7 % — ABNORMAL HIGH (ref 0.5–1.5)
Carboxyhemoglobin: 2.1 % — ABNORMAL HIGH (ref 0.5–1.5)
Methemoglobin: <0.7 % (ref 0.0–1.5)
Methemoglobin: <0.7 % (ref 0.0–1.5)
O2 Saturation: 63.5 %
O2 Saturation: 79.2 %
Total hemoglobin: 11.3 g/dL — ABNORMAL LOW (ref 12.0–16.0)
Total hemoglobin: 11.8 g/dL — ABNORMAL LOW (ref 12.0–16.0)

## 2022-10-24 LAB — APTT: aPTT: 79 seconds — ABNORMAL HIGH (ref 24–36)

## 2022-10-24 LAB — LEGIONELLA PNEUMOPHILA SEROGP 1 UR AG: L. pneumophila Serogp 1 Ur Ag: NEGATIVE

## 2022-10-24 LAB — HEPARIN LEVEL (UNFRACTIONATED): Heparin Unfractionated: 0.78 IU/mL — ABNORMAL HIGH (ref 0.30–0.70)

## 2022-10-24 LAB — TRIGLYCERIDES: Triglycerides: 99 mg/dL (ref <150)

## 2022-10-24 LAB — MAGNESIUM: Magnesium: 2.5 mg/dL — ABNORMAL HIGH (ref 1.7–2.4)

## 2022-10-24 MED ORDER — PROSOURCE TF20 ENFIT COMPATIBL EN LIQD: 60.0000 mL | Freq: Every day | ENTERAL | Status: DC

## 2022-10-24 MED ORDER — PROSOURCE PLUS PO LIQD: 30.0000 mL | Freq: Two times a day (BID) | ORAL | Status: DC

## 2022-10-24 MED ORDER — MILRINONE LACTATE IN DEXTROSE 20-5 MG/100ML-% IV SOLN
0.2500 ug/kg/min | INTRAVENOUS | Status: DC
  Administered 2022-10-24 – 2022-10-28 (×9): 0.25 ug/kg/min via INTRAVENOUS
  Filled 2022-10-24 (×9): qty 100

## 2022-10-24 MED ORDER — CLONAZEPAM 0.1 MG/ML ORAL SUSPENSION: 1.0000 mg | Freq: Three times a day (TID) | ORAL | Status: DC

## 2022-10-24 MED ORDER — CLONAZEPAM 0.25 MG PO TBDP
1.0000 mg | ORAL_TABLET | Freq: Three times a day (TID) | ORAL | Status: DC
  Administered 2022-10-24 – 2022-10-26 (×7): 1 mg
  Filled 2022-10-24 (×7): qty 4

## 2022-10-24 MED ORDER — VITAL 1.5 CAL PO LIQD
1000.0000 mL | ORAL | Status: DC
  Administered 2022-10-24 – 2022-10-25 (×2): 1000 mL

## 2022-10-24 MED ORDER — PROSOURCE TF20 ENFIT COMPATIBL EN LIQD
60.0000 mL | Freq: Four times a day (QID) | ENTERAL | Status: DC
  Administered 2022-10-24 – 2022-10-27 (×13): 60 mL
  Filled 2022-10-24 (×12): qty 60

## 2022-10-24 MED ORDER — VITAL HIGH PROTEIN PO LIQD: 1000.0000 mL | ORAL | Status: DC

## 2022-10-24 MED ORDER — POTASSIUM CHLORIDE 20 MEQ PO PACK
20.0000 meq | PACK | Freq: Once | ORAL | Status: AC
  Administered 2022-10-24: 20 meq
  Filled 2022-10-24: qty 1

## 2022-10-24 NOTE — Progress Notes (Signed)
Initial Nutrition Assessment  DOCUMENTATION CODES:   Not applicable  INTERVENTION:   Tube Feeding via OG: Initiate Vital 1.5 at 20 ml/hr today Goal is Vital 1.5 at 60 ml/hr with Pro-Source TF20 60 mL BID Goal regimen provides 137 g of protein, 2320 kcals and 1094 mL of free water   NUTRITION DIAGNOSIS:   Inadequate oral intake related to acute illness as evidenced by NPO status.  GOAL:   Patient will meet greater than or equal to 90% of their needs  MONITOR:   Vent status, TF tolerance, Labs, Weight trends  REASON FOR ASSESSMENT:   Consult, Ventilator Enteral/tube feeding initiation and management  ASSESSMENT:   66 yo male  admitted post PEA arrest in setting of hypoxia, acute on chronic heart failure with cardiogenic shock.  Pt with seizures, confirmed by EEG, with probable anoxic brain injury.  PMH includes hx of AML s/p chemo 2019, non ischemic dilated CM with HFrEF, HTN.  11/23 PEA arrest 11/24 IABP  Pt remains on vent support, sedated. +seizures confirmed via EEG. Repeat head CT with no acute abnormalities Repeat CT head yesterday negative. Noted plan to wean IABP tomorrow to allow for brain MRI Remains on pressors but requirements improved; levo at 4, vaso at 0.02  OG tube courses below diaphragm per chest xray report. Consult received to initiate trickle TF today No BM since admission; bowel regimen is ordered Significantly elevated LFTs due to shock liver  +mild edema on exam. Current wt 98.9 kg. Admit weight 89 kg. Net +6 L. Plan to utilize IBW as dry weight unknown  Unable to obtain diet and weight history from patient at this time  Labs: potassium 3.4 (L) today, phosphorus 5.1 (11/25), BUN 96, Creatinine 6.29, ionized calcium 0.94 (L) Meds: colace, ss novolog, semglee, miralax, senokot-s prn    Diet Order:   Diet Order             Diet NPO time specified  Diet effective now                   EDUCATION NEEDS:   Not appropriate for  education at this time  Skin:  Skin Assessment: Reviewed RN Assessment  Last BM:  PTA  Height:   Ht Readings from Last 1 Encounters:  10/20/22 '6\' 2"'$  (1.88 m)    Weight:   Wt Readings from Last 1 Encounters:  10/24/22 98.9 kg    BMI:  Body mass index is 27.99 kg/m.  Estimated Nutritional Needs:   Kcal:  2200-2400 kcals  Protein:  130-150 g  Fluid:  1.8 L  Kerman Passey MS, RDN, LDN, CNSC Registered Dietitian 3 Clinical Nutrition RD Pager and On-Call Pager Number Located in [place]

## 2022-10-24 NOTE — Progress Notes (Signed)
Neurology Progress Note  Patient ID: Kimber Esterly is a 66 y.o. with PMHx of  has a past medical history of Hypertension, Leukemia ([place]), and PAF (paroxysmal atrial fibrillation) ([place]) (04/27/2018).   Major interval events/Subjective: -Per ICU nurse at bedside, cardiac output slightly worse this morning, remains on norepinephrine and vasopressin, with frequent breakthrough myoclonic jerking on propofol   Exam: Current vital signs: BP (!) 88/65   Pulse (!) 113   Temp (!) 97.2 F (36.2 C)   Resp 14   Ht '6\' 2"'$  (1.88 m)   Wt 98.9 kg   SpO2 98%   BMI 27.99 kg/m  Vital signs in last 24 hours: Temp:  [96.6 F (35.9 C)-98.1 F (36.7 C)] 97.2 F (36.2 C) (11/27 0645) Pulse Rate:  [112-260] 113 (11/26 1900) Resp:  [0-24] 14 (11/27 0645) SpO2:  [94 %-100 %] 98 % (11/27 0645) Arterial Line BP: (90-117)/(56-75) 108/66 (11/27 0645) FiO2 (%):  [40 %] 40 % (11/27 0340) Weight:  [98.9 kg] 98.9 kg (11/27 0431)   Current Facility-Administered Medications:    0.9 %  sodium chloride infusion, , Intravenous, PRN, Bensimhon, Shaune Pascal, MD, Stopped at 10/23/22 1849   0.9 %  sodium chloride infusion, , Intravenous, Continuous, Bensimhon, Shaune Pascal, MD, Last Rate: 10 mL/hr at 10/24/22 0211, 10 mL/hr at 10/24/22 0211   0.9 %  sodium chloride infusion, , Intravenous, Continuous, Bensimhon, Shaune Pascal, MD, Last Rate: 10 mL/hr at 10/24/22 0600, Infusion Verify at 10/24/22 0600   0.9 %  sodium chloride infusion, 250 mL, Intravenous, PRN, Bensimhon, Shaune Pascal, MD   acetaminophen (TYLENOL) tablet 650 mg, 650 mg, Oral, Q6H PRN **OR** acetaminophen (TYLENOL) suppository 650 mg, 650 mg, Rectal, Q6H PRN, Bensimhon, Shaune Pascal, MD   [COMPLETED] amiodarone (NEXTERONE) 1.8 mg/mL load via infusion 150 mg, 150 mg, Intravenous, Once, 150 mg at 10/21/22 0922 **FOLLOWED BY** [EXPIRED] amiodarone (NEXTERONE PREMIX) 360-4.14 MG/200ML-% (1.8 mg/mL) IV infusion, 60 mg/hr, Intravenous, Continuous, Stopped at 10/21/22 1437 **FOLLOWED  BY** amiodarone (NEXTERONE PREMIX) 360-4.14 MG/200ML-% (1.8 mg/mL) IV infusion, 30 mg/hr, Intravenous, Continuous, Bensimhon, Shaune Pascal, MD, Last Rate: 16.67 mL/hr at 10/24/22 0600, 30 mg/hr at 10/24/22 0600   artificial tears (LACRILUBE) ophthalmic ointment, , Both Eyes, Q4H PRN, Jacky Kindle, MD, Given at 10/22/22 0443   Chlorhexidine Gluconate Cloth 2 % PADS 6 each, 6 each, Topical, Daily, Chand, Sudham, MD, 6 each at 10/23/22 1000   docusate (COLACE) 50 MG/5ML liquid 100 mg, 100 mg, Per Tube, BID, Bensimhon, Shaune Pascal, MD, 100 mg at 10/21/22 2247   EPINEPHrine (ADRENALIN) 5 mg in NS 250 mL (0.02 mg/mL) premix infusion, 0.5-20 mcg/min, Intravenous, Titrated, Bensimhon, Shaune Pascal, MD, Stopped at 10/22/22 0245   fentaNYL (SUBLIMAZE) injection 50-200 mcg, 50-200 mcg, Intravenous, Q30 min PRN, Bensimhon, Shaune Pascal, MD, 100 mcg at 10/21/22 0430   heparin ADULT infusion 100 units/mL (25000 units/280m), 1,400 Units/hr, Intravenous, Continuous, Wise, Nason S, RPH, Last Rate: 14 mL/hr at 10/24/22 0600, 1,400 Units/hr at 10/24/22 0600   hydrocortisone sodium succinate (SOLU-CORTEF) 100 MG injection 100 mg, 100 mg, Intravenous, Q12H, Chand, Sudham, MD, 100 mg at 10/24/22 0530   influenza vac split quadrivalent PF (FLUARIX) injection 0.5 mL, 0.5 mL, Intramuscular, Prior to discharge, SChesley Mires MD   insulin aspart (novoLOG) injection 0-15 Units, 0-15 Units, Subcutaneous, Q4H, Bensimhon, DShaune Pascal MD, 2 Units at 10/24/22 0438   insulin glargine-yfgn (SEMGLEE) injection 20 Units, 20 Units, Subcutaneous, Daily, CJacky Kindle MD, 20 Units at 10/23/22 1056   levETIRAcetam (KEPPRA) IVPB  1000 mg/100 mL premix, 1,000 mg, Intravenous, Q12H, Jacky Kindle, MD, Stopped at 10/24/22 0200   LORazepam (ATIVAN) injection 2 mg, 2 mg, Intravenous, Q1H PRN, Jacky Kindle, MD, 2 mg at 10/23/22 1513   midazolam (VERSED) injection 1-2 mg, 1-2 mg, Intravenous, Q1H PRN, Bensimhon, Shaune Pascal, MD, 2 mg at 10/21/22 0429   milrinone  (PRIMACOR) 20 MG/100 ML (0.2 mg/mL) infusion, 0.125 mcg/kg/min, Intravenous, Continuous, Bensimhon, Shaune Pascal, MD, Stopped at 10/22/22 0854   norepinephrine (LEVOPHED) 16 mg in 286m (0.064 mg/mL) premix infusion, 0-40 mcg/min, Intravenous, Titrated, Bensimhon, DShaune Pascal MD, Last Rate: 8.44 mL/hr at 10/24/22 0600, 9.003 mcg/min at 10/24/22 0600   ondansetron (ZOFRAN) injection 4 mg, 4 mg, Intravenous, Q6H PRN, Bensimhon, DShaune Pascal MD   ondansetron (ZOFRAN) tablet 4 mg, 4 mg, Oral, Q6H PRN **OR** [DISCONTINUED] ondansetron (ZOFRAN) injection 4 mg, 4 mg, Intravenous, Q6H PRN, Bensimhon, DShaune Pascal MD   Oral care mouth rinse, 15 mL, Mouth Rinse, Q2H, Bensimhon, DShaune Pascal MD, 15 mL at 10/24/22 0630   Oral care mouth rinse, 15 mL, Mouth Rinse, PRN, Bensimhon, DShaune Pascal MD   pantoprazole (PROTONIX) injection 40 mg, 40 mg, Intravenous, Daily, Bensimhon, DShaune Pascal MD, 40 mg at 10/23/22 1056   piperacillin-tazobactam (ZOSYN) IVPB 2.25 g, 2.25 g, Intravenous, Q8H, Chand, SCurrie Paris MD, Stopped at 10/24/22 0504   pneumococcal 20-valent conjugate vaccine (PREVNAR 20) injection 0.5 mL, 0.5 mL, Intramuscular, Prior to discharge, Bensimhon, DShaune Pascal MD   polyethylene glycol (MIRALAX / GLYCOLAX) packet 17 g, 17 g, Per Tube, Daily, Bensimhon, DShaune Pascal MD   propofol (DIPRIVAN) 1000 MG/100ML infusion, 5-80 mcg/kg/min, Intravenous, Titrated, Chand, SCurrie Paris MD, Last Rate: 10.78 mL/hr at 10/24/22 0600, 20 mcg/kg/min at 10/24/22 0600   senna-docusate (Senokot-S) tablet 1 tablet, 1 tablet, Oral, QHS PRN, Bensimhon, DShaune Pascal MD   sodium bicarbonate injection 100 mEq, 100 mEq, Intravenous, Once, Bensimhon, DShaune Pascal MD   sodium chloride flush (NS) 0.9 % injection 3 mL, 3 mL, Intravenous, Q12H, Bensimhon, DShaune Pascal MD, 3 mL at 10/23/22 2000   sodium chloride flush (NS) 0.9 % injection 3 mL, 3 mL, Intravenous, Q12H, Bensimhon, DShaune Pascal MD, 3 mL at 10/23/22 2000   sodium chloride flush (NS) 0.9 % injection 3 mL, 3 mL, Intravenous,  PRN, Bensimhon, DShaune Pascal MD   vasopressin (PITRESSIN) 20 Units in sodium chloride 0.9 % 100 mL infusion-*FOR SHOCK*, 0-0.03 Units/min, Intravenous, Continuous, Bensimhon, DShaune Pascal MD, Last Rate: 6 mL/hr at 10/24/22 0600, 0.02 Units/min at 10/24/22 0600   Physical Exam  Constitutional: Appears well-developed and well-nourished.  Psych: Minimally interactive Eyes: Scleral edema is present with some mild scleral icterus bilaterally HENT: ET tube in place Cardiovascular: Atrial fibrillation on monitor  Respiratory: Breathing comfortably on the ventilator, breathing at the ventilator set rate GI: Soft.  No distension.   Neuro: Mental Status: Does not open eyes spontaneously, to voice or noxious stimulation Does not follow any commands Cranial Nerves: II: No blink to threat. Pupils are equal, round, and reactive to light.   III,IV, VI/VIII: EOMI absent to VOR V/VII: Facial sensation is symmetric to eyelash brush (triggers myoclonic blinking bilaterally) VIII: No response to voice X/XI: Intact cough/gag XII: Unable to assess tongue protrusion secondary to patient's mental status  Motor/Sensory: Tone is normal. Bulk is normal.  Slight myoclonic jerking to noxious stim in any of the extremities, no purposeful movement, no posturing  Pertinent Labs:  Basic Metabolic Panel: Recent Labs  Lab 10/21/22 0500 10/21/22 0754 10/21/22 2035 10/21/22 2316 10/22/22  0217 10/22/22 0220 10/22/22 1619 10/22/22 1640 10/23/22 0508 10/23/22 1631 10/23/22 1639 10/24/22 0422 10/24/22 0446  NA 146*   < > 144   < > 142   < > 141   < > 142 142 139 138 140  K 4.5   < > 3.3*   < > 3.3*   < > 3.3*   < > 3.6 3.4* 3.2* 3.3* 3.4*  CL 101   < > 99  --  93*  --  94*  --  93* 92*  --   --  88*  CO2 12*   < > 19*  --  23  --  25  --  26 24  --   --  25  GLUCOSE 139*   < > 344*  --  338*  --  135*  --  153* 140*  --   --  130*  BUN 29*   < > 40*  --  46*  --  57*  --  71* 83*  --   --  96*  CREATININE 2.44*    < > 3.35*  --  3.88*  --  4.71*  --  5.43* 5.94*  --   --  6.29*  CALCIUM 7.6*   < > 6.9*  --  7.8*  --  8.7*  --  8.2* 7.8*  --   --  8.5*  MG 2.6*  --  1.9  --  2.3  --   --   --   --  2.5*  --   --  2.5*  PHOS  --   --   --   --  5.1*  --   --   --   --   --   --   --   --    < > = values in this interval not displayed.   Lab Results  Component Value Date   ALT 4,555 (H) 10/24/2022   AST 2,247 (H) 10/24/2022   ALKPHOS 104 10/24/2022   BILITOT 2.2 (H) 10/24/2022   (Gradually improving)   CBC: Recent Labs  Lab 10/20/22 1715 10/20/22 1957 10/21/22 0500 10/21/22 0754 10/21/22 2335 10/22/22 0220 10/22/22 1619 10/22/22 1640 10/23/22 0410 10/23/22 1631 10/23/22 1639 10/24/22 0422 10/24/22 0446  WBC 10.6*   < > 19.9*  --  23.9*  --  29.8*  --   --  28.7*  --   --  26.4*  NEUTROABS 7.7  --   --   --   --   --   --   --   --   --   --   --   --   HGB 13.1   < > 13.4   < > 11.3*   < > 11.4*   < > 11.6* 11.3* 11.9* 11.6* 11.5*  HCT 37.5*   < > 39.6   < > 31.6*   < > 31.9*   < > 34.0* 32.3* 35.0* 34.0* 32.8*  MCV 85.2   < > 89.2  --  85.6  --  84.6  --   --  85.4  --   --  85.2  PLT 236   < > 193  --  175  --  138*  --   --  122*  --   --  118*   < > = values in this interval not displayed.    Coagulation Studies: No results for input(s): "LABPROT", "INR" in the last 72 hours.  Carboxyhemoglobin 2.7% (ref range 0 to 1.5%)  EEG read: "EEG showed near continuous generalized mixed frequencies with predominantly 8-'9hz'$  alpha activity admixed with intermittent generalized 2 to 3 Hz delta slowing as well as low amplitude 12 to 13 Hz beta activity, ABNORMALITY: Continuous slow, generalized"   Assessment: 66 year old with clinical history, neurological examination and EEG findings highly concerning for anoxic brain injury, though from a formal neuro prognostication perspective (2016 Annamarie Dawley et al guidelines) at this time I would state he is in an indeterminant category, leaning towards  poor prognosis due to no movement other than myoclonic jerking.  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)  Impression:  - Acute hypoxic respiratory failure, aspiration pneumonia, pulmonary edema - S/p PEA arrest secondary to hypoxia, ROSC after 6 minutes of CPR  - Myoclonic seizure with concern for anoxic brain injury, stable - A-fib with RVR, improving - Mixed shock, septic, cardiogenic - EF 20% with intra-arterial balloon pump in place (11/24) - Acute kidney injury secondary to ischemic acute tubular necrosis with anion gap acidosis and lactic acidosis - Hypokalemia and hypocalcemia, resolving - Type 2 diabetes with hyperglycemia, also being treated with stress dose steroids - Shock liver, improving - 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  Critical care was necessary to treat or prevent imminent or life-threatening deterioration.  Critical care was time spent personally by me on the following activities: development of treatment plan with patient and/or surrogate as well as nursing, discussions with consultants, evaluation of patient's response to treatment, examination of patient, obtaining history from patient or surrogate, ordering and performing treatments and interventions, ordering and review of laboratory studies, ordering and review of radiographic  studies, pulse oximetry and re-evaluation of patient's condition.

## 2022-10-24 NOTE — Procedures (Signed)
Patient Name: [place]  MRN: [id number]  Epilepsy Attending: Lora Havens  Referring Physician/Provider: Jacky Kindle, MD  Duration: 10/23/2022 1844 to 10/24/2022 1844   Patient history: 66yo M s/p cardiac arrest now with myoclonic jerks. EEG to evaluate for seizure.   Level of alertness: comatose   AEDs during EEG study: LEV, propofol   Technical aspects: This EEG study was done with scalp electrodes positioned according to the 10-20 International system of electrode placement. Electrical activity was reviewed with band pass filter of 1-'70Hz'$ , sensitivity of 7 uV/mm, display speed of 3m/sec with a '60Hz'$  notched filter applied as appropriate. EEG data were recorded continuously and digitally stored.  Video monitoring was available and reviewed as appropriate.   Description: EEG showed near continuous generalized mixed frequencies with predominantly 8-'9hz'$  alpha activity admixed with intermittent generalized 2 to 3 Hz delta slowing as well as low amplitude 12 to 13 Hz beta activity.   ABNORMALITY -Continuous slow, generalized   IMPRESSION: This study is suggestive of severe diffuse encephalopathy, nonspecific etiology.  No definite seizures or epileptiform discharges were seen during the study.   Tenzin Pavon OBarbra Sarks

## 2022-10-24 NOTE — Progress Notes (Signed)
Heart Failure Navigator Progress Note  Assessed for Heart & Vascular TOC clinic readiness.  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 Haroldine Laws.    Earnestine Leys, BSN, Clinical cytogeneticist Only

## 2022-10-24 NOTE — Progress Notes (Addendum)
Advanced Heart Failure Rounding Note   Subjective:    Remains intubated and sedated. EEG confirmed seizure activty. Repeat head CT yesterday negative.   On NE 4. VP 0.02. Milrinone off due to low SVR. IABP 1:1   Co-ox 64% Lactic acid >9 -> 5.8 -> 2.3.   SCr trending up 5.4>>6.29 today. BUN 96. 1.2L in UOP yesterday. Wts trending up. CVP 12.  LFTs downtrending   Swan #s  CVP 9 PAP: 34/15 CO:  2.87 CI:  1.35  PAPi 2.1 SVR 2006   Objective:   Weight Range:  Vital Signs:   Temp:  [96.6 F (35.9 C)-97.9 F (36.6 C)] 97.2 F (36.2 C) (11/27 0645) Pulse Rate:  [63-260] 63 (11/27 0759) Resp:  [0-24] 14 (11/27 0759) BP: (112)/(64) 112/64 (11/27 0759) SpO2:  [95 %-100 %] 97 % (11/27 0800) Arterial Line BP: (90-117)/(56-75) 108/66 (11/27 0645) FiO2 (%):  [40 %] 40 % (11/27 0759) Weight:  [98.9 kg] 98.9 kg (11/27 0431) Last BM Date :  (pta)  Weight change: Filed Weights   10/22/22 0500 10/23/22 0500 10/24/22 0431  Weight: 97.8 kg 98.3 kg 98.9 kg    Intake/Output:   Intake/Output Summary (Last 24 hours) at 10/24/2022 0844 Last data filed at 10/24/2022 0700 Gross per 24 hour  Intake 2118.22 ml  Output 2120 ml  Net -1.78 ml    PHYSICAL EXAM: CVP 12 General:  critically ill, intubated and sedated  HEENT: normal + ETT  Neck: supple. JVD to jaw. + lt IJ swan Carotids 2+ bilat; no bruits. No lymphadenopathy or thyromegaly appreciated. Cor: PMI nondisplaced. Regular rate & rhythm. No rubs, gallops or murmurs. Lungs: intubated and clear  Abdomen: soft, nontender, nondistended. No hepatosplenomegaly. No bruits or masses. Good bowel sounds. Extremities: no cyanosis, clubbing, rash, trace b/l LE edema Lt fem IABP  Neuro: intubated and sedated    Telemetry: NSR w/ PACs 60s, NSVT Personally reviewed    Labs: Basic Metabolic Panel: Recent Labs  Lab 10/21/22 0500 10/21/22 0754 10/21/22 2035 10/21/22 2316 10/22/22 0217 10/22/22 0220 10/22/22 1619  10/22/22 1640 10/23/22 0508 10/23/22 1631 10/23/22 1639 10/24/22 0422 10/24/22 0446  NA 146*   < > 144   < > 142   < > 141   < > 142 142 139 138 140  K 4.5   < > 3.3*   < > 3.3*   < > 3.3*   < > 3.6 3.4* 3.2* 3.3* 3.4*  CL 101   < > 99  --  93*  --  94*  --  93* 92*  --   --  88*  CO2 12*   < > 19*  --  23  --  25  --  26 24  --   --  25  GLUCOSE 139*   < > 344*  --  338*  --  135*  --  153* 140*  --   --  130*  BUN 29*   < > 40*  --  46*  --  57*  --  71* 83*  --   --  96*  CREATININE 2.44*   < > 3.35*  --  3.88*  --  4.71*  --  5.43* 5.94*  --   --  6.29*  CALCIUM 7.6*   < > 6.9*  --  7.8*  --  8.7*  --  8.2* 7.8*  --   --  8.5*  MG 2.6*  --  1.9  --  2.3  --   --   --   --  2.5*  --   --  2.5*  PHOS  --   --   --   --  5.1*  --   --   --   --   --   --   --   --    < > = values in this interval not displayed.    Liver Function Tests: Recent Labs  Lab 10/20/22 1715 10/22/22 0217 10/23/22 1631 10/24/22 0446  AST 53* >10,000* 3,777* 2,247*  ALT 59* 5,030* 5,167* 4,555*  ALKPHOS 32* 94 119 104  BILITOT 1.6* 2.6* 2.2* 2.2*  PROT 7.1 5.8* 6.0* 5.9*  ALBUMIN 3.2* 2.6* 2.7* 2.6*   No results for input(s): "LIPASE", "AMYLASE" in the last 168 hours. No results for input(s): "AMMONIA" in the last 168 hours.  CBC: Recent Labs  Lab 10/20/22 1715 10/20/22 1957 10/21/22 0500 10/21/22 0754 10/21/22 2335 10/22/22 0220 10/22/22 1619 10/22/22 1640 10/23/22 0410 10/23/22 1631 10/23/22 1639 10/24/22 0422 10/24/22 0446  WBC 10.6*   < > 19.9*  --  23.9*  --  29.8*  --   --  28.7*  --   --  26.4*  NEUTROABS 7.7  --   --   --   --   --   --   --   --   --   --   --   --   HGB 13.1   < > 13.4   < > 11.3*   < > 11.4*   < > 11.6* 11.3* 11.9* 11.6* 11.5*  HCT 37.5*   < > 39.6   < > 31.6*   < > 31.9*   < > 34.0* 32.3* 35.0* 34.0* 32.8*  MCV 85.2   < > 89.2  --  85.6  --  84.6  --   --  85.4  --   --  85.2  PLT 236   < > 193  --  175  --  138*  --   --  122*  --   --  118*   < > =  values in this interval not displayed.    Cardiac Enzymes: No results for input(s): "CKTOTAL", "CKMB", "CKMBINDEX", "TROPONINI" in the last 168 hours.  BNP: BNP (last 3 results) Recent Labs    10/20/22 1715  BNP 1,300.4*    ProBNP (last 3 results) No results for input(s): "PROBNP" in the last 8760 hours.    Other results:  Imaging: CT HEAD WO CONTRAST (5MM)  Result Date: 10/23/2022 CLINICAL DATA:  Seizure. EXAM: CT HEAD WITHOUT CONTRAST TECHNIQUE: Contiguous axial images were obtained from the base of the skull through the vertex without intravenous contrast. RADIATION DOSE REDUCTION: This exam was performed according to the departmental dose-optimization program which includes automated exposure control, adjustment of the mA and/or kV according to patient size and/or use of iterative reconstruction technique. COMPARISON:  10/21/2022 FINDINGS: Brain: There is no evidence for acute hemorrhage, hydrocephalus, mass lesion, or abnormal extra-axial fluid collection. No definite CT evidence for acute infarction. Vascular: No hyperdense vessel or unexpected calcification. Skull: No evidence for fracture. No worrisome lytic or sclerotic lesion. Sinuses/Orbits: Chronic mucosal disease noted maxillary and ethmoid sinuses. No evidence for mastoid effusion. Visualized portions of the globes and intraorbital fat are unremarkable. Other: None. IMPRESSION: 1. No acute intracranial abnormality. 2. Chronic paranasal sinus disease. Electronically Signed   By: Misty Stanley M.D.   On: 10/23/2022 09:58  DG CHEST PORT 1 VIEW  Result Date: 10/22/2022 CLINICAL DATA:  Intra-aortic balloon catheter. EXAM: PORTABLE CHEST 1 VIEW COMPARISON:  Same day. FINDINGS: Endotracheal and nasogastric tubes are unchanged in position. Left internal jugular Swan-Ganz catheter is unchanged. Left-sided defibrillator is again noted. Stable position of distal tip of aortic balloon catheter which is in expected position of  midportion of descending thoracic aorta. Stable bibasilar opacities are noted. IMPRESSION: Stable support apparatus as described above. Stable bibasilar opacities. Electronically Signed   By: Marijo Conception M.D.   On: 10/22/2022 16:05   Overnight EEG with video  Result Date: 10/22/2022 Lora Havens, MD     10/23/2022  8:51 AM Patient Name: Zyler Hyson MRN: [id number] Epilepsy Attending: Lora Havens Referring Physician/Provider: Jacky Kindle, MD Duration: 10/21/2022 1844 to 10/22/2022 1844 Patient history: 66yo M s/p cardiac arrest now with myoclonic jerks. EEG to evaluate for seizure.py Level of alertness: comatose AEDs during EEG study: LEV, propofol Technical aspects: This EEG study was done with scalp electrodes positioned according to the 10-20 International system of electrode placement. Electrical activity was reviewed with band pass filter of 1-'70Hz'$ , sensitivity of 7 uV/mm, display speed of 67m/sec with a '60Hz'$  notched filter applied as appropriate. EEG data were recorded continuously and digitally stored.  Video monitoring was available and reviewed as appropriate. Description: EEG showed continuous generalized 5 to 8 Hz theta-alpha activity admixed with intermittent generalized 2-'3hz'$  delta slowing. There is also 15 to 18 Hz beta activity distributed symmetrically and diffusely.  Hyperventilation and photic stimulation were not performed.   Event button was pressed on 10/22/2022 at 0941 during which patient was noted to have coughing and jerking of bilateral upper extremities after suctioning. Concomitant eeg before, during and after the event didn't show any eeg change to suggest seizure. ABNORMALITY - Continuous slow, generalized IMPRESSION: This study is suggestive of severe diffuse encephalopathy, nonspecific etiology. No seizures or epileptiform discharges were seen throughout the recording. Event button was pressed on 10/22/2022 at 0941 during which patient was noted to have coughing  and jerking of bilateral upper extremities after suctioning without concomitant eeg change. This was most likely not an epileptic event. Priyanka OBarbra Sarks    Medications:     Scheduled Medications:  Chlorhexidine Gluconate Cloth  6 each Topical Daily   docusate  100 mg Per Tube BID   hydrocortisone sod succinate (SOLU-CORTEF) inj  100 mg Intravenous Q12H   insulin aspart  0-15 Units Subcutaneous Q4H   insulin glargine-yfgn  20 Units Subcutaneous Daily   mouth rinse  15 mL Mouth Rinse Q2H   pantoprazole (PROTONIX) IV  40 mg Intravenous Daily   polyethylene glycol  17 g Per Tube Daily   sodium bicarbonate  100 mEq Intravenous Once   sodium chloride flush  3 mL Intravenous Q12H   sodium chloride flush  3 mL Intravenous Q12H    Infusions:  sodium chloride Stopped (10/23/22 1849)   sodium chloride 10 mL/hr (10/24/22 0211)   sodium chloride 10 mL/hr at 10/24/22 0600   sodium chloride     amiodarone 30 mg/hr (10/24/22 0600)   epinephrine Stopped (10/22/22 0245)   heparin 1,400 Units/hr (10/24/22 0600)   levETIRAcetam Stopped (10/24/22 0200)   milrinone Stopped (10/22/22 0854)   norepinephrine (LEVOPHED) Adult infusion 9.003 mcg/min (10/24/22 0600)   piperacillin-tazobactam (ZOSYN)  IV Stopped (10/24/22 0504)   propofol (DIPRIVAN) infusion 20 mcg/kg/min (10/24/22 0600)   vasopressin 0.02 Units/min (10/24/22 0600)    PRN Medications: sodium  chloride, sodium chloride, acetaminophen **OR** acetaminophen, artificial tears, fentaNYL (SUBLIMAZE) injection, influenza vac split quadrivalent PF, LORazepam, midazolam, ondansetron (ZOFRAN) IV, ondansetron **OR** [DISCONTINUED] ondansetron (ZOFRAN) IV, mouth rinse, pneumococcal 20-valent conjugate vaccine, senna-docusate, sodium chloride flush   Assessment/Plan:   1. 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 EF 35% in 2020. S/p Biotronik ICD - EF 35% -> 20% in setting of AF - Echo 10/21/22  EF 20% RV moderately reduced - S/p PEA arrest 11/23 - Now on IABP, NE 4, VP 0.02. Co-ox 64%, CI 1.35. SVR High - Stop VP. Add back Milrinone 0.25. Follow swan hemodynamics - Lactate > 9.0 -> 5.8 -> 2.3 - Continue supportive care - Will try to wean IABP tomorrow to allow for brain MRI   2. Anoxic brain injury - having seizures/myoclonus -> propofol/Keppra - Brain CT 10/21/22 - normal - Repeat CT 11/26, normal. Will plan MRI tomorrow after IABP out  - D/w neurology at bedside. Suspect poor prognosis  3. PEA arrest - 10/20/22 - CPR x 6 mins  4. AF, persistent - likely cause of tachy CM - was in NSR on 11/24 now in AF/MAT but rate improved - Continue heparin/IV amio  5. AKI - due to ATN/shock - SCr 0.8 -> 3.88 -> 5.43->6.29  - Continue hemodynamic support - Consider CRRT pending Neuro prognostication. Currently not acidotic or markedly volume overloaded so can wait one more day and reassess.   6. Acute hypoxic respiratory failure - due to HF/cardiac arrest - remains intubated  7. Shock liver - continue supportive care  8. Hypocalcemia/hypokalemia - supp  9. H/o AML  - s/p chemo 2019   Length of Stay: 4   Brittainy Ladoris Gene  10/24/2022, 8:44 AM  Advanced Heart Failure Team Pager [phone] (M-F; 7a - 4p)  Please contact [facility] for night-coverage after hours (4p -7a ) and weekends on [email or web address]  Agree with above.   Remains intubated/sedated. Has recurrent seizures quickly after sedation held. Remains on IABP. NE at 4 VP 0.02. Hemodynamics much worse on swan today with low output and elevated SVR. Scr climbing but making urine and not volume overloaded.   Back in NSR this am on IV amio. On heparin   General:  Sedated on vent HEENT: normal + ETT + scleral icterus  Neck: supple. RIJ swan Carotids 2+ bilat; no bruits. No lymphadenopathy or thryomegaly appreciated. Cor: PMI nondisplaced. Regular rate & rhythm. No rubs, gallops or murmurs. Lungs:  clear Abdomen: soft, nontender, nondistended. No hepatosplenomegaly. No bruits or masses. Good bowel sounds. Extremities: no cyanosis, clubbing, rash, edema + LFA IABP Neuro: intubated/sedated. + myoclonus with Babinski   Remains critically ill. Grave concern for severe anoxic brain injury. Will adjust inotropes today and hopefully can stabilize CO enough to allow [place] to wean IABP and remove swan for brain MRI to further evaluate tomorrow. Biotronik rep has placed device in MRI mode today. Renal function worse today but no indication for HD at this point. D/w CCM at bedside.   CRITICAL CARE Performed by: Glori Bickers  Total critical care time: 45 minutes  Critical care time was exclusive of separately billable procedures and treating other patients.  Critical care was necessary to treat or prevent imminent or life-threatening deterioration.  Critical care was time spent personally by me (independent of midlevel providers or residents) on the following activities: development of treatment plan with patient and/or surrogate as well as nursing, discussions with consultants, evaluation  of patient's response to treatment, examination of patient, obtaining history from patient or surrogate, ordering and performing treatments and interventions, ordering and review of laboratory studies, ordering and review of radiographic studies, pulse oximetry and re-evaluation of patient's condition.  Glori Bickers, MD  11:59 AM

## 2022-10-24 NOTE — Progress Notes (Signed)
ANTICOAGULATION CONSULT NOTE  Pharmacy Consult for heparin Indication:  IABP + atrial flutter  No Known Allergies  Patient Measurements: Height: '6\' 2"'$  (188 cm) Weight: 98.9 kg (218 lb 0.6 oz) IBW/kg (Calculated) : 82.2 Heparin Dosing Weight: 88 kg  Vital Signs: Temp: 98.1 F (36.7 C) (11/27 0846) BP: 112/64 (11/27 0759) Pulse Rate: 116 (11/27 0846)  Labs: Recent Labs    10/21/22 2335 10/22/22 0217 10/22/22 1619 10/22/22 1640 10/22/22 2103 10/23/22 0410 10/23/22 0508 10/23/22 1631 10/23/22 1639 10/24/22 0422 10/24/22 0446  HGB 11.3*   < > 11.4*   < >  --    < >  --  11.3* 11.9* 11.6* 11.5*  HCT 31.6*   < > 31.9*   < >  --    < >  --  32.3* 35.0* 34.0* 32.8*  PLT 175  --  138*  --   --   --   --  122*  --   --  118*  APTT 46*   < > 57*  --  68*  --  71*  --   --   --  79*  HEPARINUNFRC 1.07*  --   --   --   --   --  0.89*  --   --   --  0.78*  CREATININE  --    < > 4.71*  --   --   --  5.43* 5.94*  --   --  6.29*   < > = values in this interval not displayed.     Estimated Creatinine Clearance: 14.9 mL/min (A) (by C-G formula based on SCr of 6.29 mg/dL (H)).   Medical History: Past Medical History:  Diagnosis Date   Hypertension    Leukemia ([place])    PAF (paroxysmal atrial fibrillation) ([place]) 04/27/2018    Medications:  Eliquis 5 mg BID PTA   Assessment: Mr. Eslick presented with increasing dyspnea on exertion for the past 2 weeks. Was found to be in atrial flutter with RVR on initial EKG. Due to worsening respiratory status, he was intubated overnight and started on vasopressor support. He went into PEA arrest and required 6 minutes of CPR and epi prior to ROSC. Given cardiogenic shock, he was taken to the cath lab this morning and an IABP was placed for further support. He has been taking Eliquis PTA for atrial flutter and last dose was 11/23 at 0900. Pharmacy has been consulted for heparin dosing.   aPTT is therapeutic at 79 this morning, on 1400 units/hr.  Heparin level of 0.78 does not yet correlate, but is trending down. Hgb stable at 11.6, plts 118 trending down slightly. Per RN, no issues with heparin infusion, but this morning some blood was noted from OG tube on continuous suction. RN switched to intermittent suction and no bleeding noted since then.   Goal of Therapy:  Heparin level 0.3-0.5 units/ml aPTT 66-85 seconds Monitor platelets by anticoagulation protocol: Yes   Plan:  Continue heparin infusion at 1400 units/hr Obtain heparin level and aPTT daily Monitor heparin level, aPTT, CBC, renal function, and s/sx of bleeding daily.  Eliseo Gum, PharmD PGY1 Pharmacy Resident   10/24/2022  8:53 AM

## 2022-10-24 NOTE — Progress Notes (Signed)
NAME:  Brendan Ellis, MRN:  [id number], DOB:  12/10/1955, LOS: 4 ADMISSION DATE:  10/20/2022, CONSULTATION DATE:  10/21/2022 REFERRING MD:  Dr. Posey Pronto, [facility], CHIEF COMPLAINT:  Short of breath   History of Present Illness:  66 yo male former smoker developed cough, sore throat and dyspnea about 1 week prior to admission.  Started on omnicef by his PCP, but symptoms progressed.  He presented to [facility] on 10/20/22.  He is followed by cardiology in [facility] for chemo induced CM and a fib.  He had A fib with RVR in the ER and started on amiodarone.  CT angio chest 2.2 x 1.8 cm pretracheal LN, small chronic loculated effusions, consolidative ASD in lower lobes, and interlobular septal thickening.  Started on IV ABx, diuretics and supplemental oxygen.  He was tried on Bipap but wasn't able to tolerate this.  PCCM consulted to arrange for management in ICU.  Pertinent  Medical History  Non ischemic dilated CM with HFrEF, s/p AICD, PAF on eliquis, Leukemia 2019, HTN  Significant Hospital Events: Including procedures, antibiotic start and stop dates in addition to other pertinent events   11/23 Admit 11/24 Transfer to ICU 11/26 seizure on EEG with sedation paused  Interim History / Subjective:  Seizure last night with sedation weaned. Afebrile overnight.   Objective   Blood pressure 121/80, pulse 80, temperature (!) 97.2 F (36.2 C), resp. rate 14, height '6\' 2"'$  (1.88 m), weight 98.9 kg, SpO2 99 %. PAP: (30-48)/(14-25) 35/17 CVP:  [7 mmHg-13 mmHg] 7 mmHg PCWP:  [17 mmHg-18 mmHg] 17 mmHg CO:  [2.8 L/min-5.4 L/min] 5.4 L/min CI:  [1.3 L/min/m2-2.5 L/min/m2] 2.5 L/min/m2  Vent Mode: PRVC FiO2 (%):  [40 %] 40 % Set Rate:  [14 bmp] 14 bmp Vt Set:  [650 mL] 650 mL PEEP:  [5 cmH20] 5 cmH20 Plateau Pressure:  [20 cmH20] 20 cmH20   Intake/Output Summary (Last 24 hours) at 10/24/2022 1646 Last data filed at 10/24/2022 1600 Gross per 24 hour  Intake 2148.03 ml  Output 2370 ml  Net -221.97 ml     Filed Weights   10/22/22 0500 10/23/22 0500 10/24/22 0431  Weight: 97.8 kg 98.3 kg 98.9 kg    Examination:   Physical exam: General: Critically ill-appearing man lying in bed no acute distress HEENT: [place]/AT, eyes anicteric, endotracheal tube in place.  OGT. Neuro: RASS -5, not withdrawing to painful stimulation in the arms.  Twitching in facial muscles, toes with stimulation, sometimes spontaneous.  Chest: No wheezing or rhonchi Heart: Swan S2, regular rate, irregular rhythm Abdomen: Soft, nontender, hypoactive bowel sounds Skin:, Dry, no diffuse rashes  Potassium 3.4 BUN 96 Creatinine 6.29 AST 2247 ALT 4555 Bilirubin 2.2 WBC 26.4 H/H11.5/32.8 Co. ox 63.5%> 79.2% CXR personally reviewed-improving right lower lobe infiltrate, endotracheal tube in appropriate position.   Resolved Hospital Problem list     Assessment & Plan:  Acute hypoxic respiratory failure with aspiration pneumonia and pulmonary edema -LTVV - VAP prevention protocol - PAD protocol for sedation - Continue Zosyn - Daily SAT and SBT when appropriate, mental status currently precludes. - Stress dose steroids  Likely septic shock due to aspiration pneumonia -Continue empiric Zosyn -Stress dose steroids  Paroxysmal A fib with RVR Acute on chronic HFrEF with cardiogenic shock S/p PEA cardiac arrest in the setting of hypoxia -Continue balloon pump with full support - Milrinone added today - Continue vasopressin, norepinephrine -Continue heparin -Plans for weaning balloon pump hopefully tomorrow - Continue amiodarone -Appreciate advanced heart failure's management  Acute metabolic, possibly anoxic encephalopathy  Myoclonic seizure Probable anoxic brain injury -Continue continuous EEG monitoring -Keppra, propofol - Appreciate neurology management - Continue supportive care - Needs brain MRI tomorrow  Acute kidney injury due to ischemic ATN Anion gap acidosis with lactic acidosis -Strict  I/O - Maintain renal perfusion - Renally dose meds and avoid nephrotoxic meds - Continue to monitor - No acute indications for dialysis.  Shock liver - Supportive care, maintain adequate perfusion   Hypokalemia, hypocalcemia -Continue aggressive electrolyte repletion - Continue to monitor  Diabetes type 2 with hyperglycemia in the setting of stress dose steroid -Semglee 20 units daily - Sliding scale insulin as needed - Goal blood glucose 140-180  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 -supportive care  Prognosis guarded with severe multiorgan failure and signs concerning for anoxic brain injury.  Daughter and wife updated at bedside.  Best Practice (right click and "Reselect all SmartList Selections" daily)   Diet/type: tubefeeds DVT prophylaxis: Systemic heparin GI prophylaxis: PPI Lines: Yes needed Foley: Yes still needed Code Status:  full code Last date of multidisciplinary goals of care discussion [11/26 patient's family was updated   Labs   CBC: Recent Labs  Lab 10/20/22 1715 10/20/22 1957 10/21/22 0500 10/21/22 0754 10/21/22 2335 10/22/22 0220 10/22/22 1619 10/22/22 1640 10/23/22 0410 10/23/22 1631 10/23/22 1639 10/24/22 0422 10/24/22 0446  WBC 10.6*   < > 19.9*  --  23.9*  --  29.8*  --   --  28.7*  --   --  26.4*  NEUTROABS 7.7  --   --   --   --   --   --   --   --   --   --   --   --   HGB 13.1   < > 13.4   < > 11.3*   < > 11.4*   < > 11.6* 11.3* 11.9* 11.6* 11.5*  HCT 37.5*   < > 39.6   < > 31.6*   < > 31.9*   < > 34.0* 32.3* 35.0* 34.0* 32.8*  MCV 85.2   < > 89.2  --  85.6  --  84.6  --   --  85.4  --   --  85.2  PLT 236   < > 193  --  175  --  138*  --   --  122*  --   --  118*   < > = values in this interval not displayed.     Basic Metabolic Panel: Recent Labs  Lab 10/21/22 0500 10/21/22 0754 10/21/22 2035 10/21/22 2316 10/22/22 0217 10/22/22 0220 10/22/22 1619 10/22/22 1640 10/23/22 0508 10/23/22 1631  10/23/22 1639 10/24/22 0422 10/24/22 0446  NA 146*   < > 144   < > 142   < > 141   < > 142 142 139 138 140  K 4.5   < > 3.3*   < > 3.3*   < > 3.3*   < > 3.6 3.4* 3.2* 3.3* 3.4*  CL 101   < > 99  --  93*  --  94*  --  93* 92*  --   --  88*  CO2 12*   < > 19*  --  23  --  25  --  26 24  --   --  25  GLUCOSE 139*   < > 344*  --  338*  --  135*  --  153* 140*  --   --  130*  BUN 29*   < > 40*  --  46*  --  57*  --  71* 83*  --   --  96*  CREATININE 2.44*   < > 3.35*  --  3.88*  --  4.71*  --  5.43* 5.94*  --   --  6.29*  CALCIUM 7.6*   < > 6.9*  --  7.8*  --  8.7*  --  8.2* 7.8*  --   --  8.5*  MG 2.6*  --  1.9  --  2.3  --   --   --   --  2.5*  --   --  2.5*  PHOS  --   --   --   --  5.1*  --   --   --   --   --   --   --   --    < > = values in this interval not displayed.    GFR: Estimated Creatinine Clearance: 14.9 mL/min (A) (by C-G formula based on SCr of 6.29 mg/dL (H)). Recent Labs  Lab 10/21/22 2335 10/21/22 2342 10/22/22 0541 10/22/22 1619 10/23/22 0555 10/23/22 1631 10/23/22 2055 10/24/22 0446  WBC 23.9*  --   --  29.8*  --  28.7*  --  26.4*  LATICACIDVEN  --    < > 5.8* 3.1* 2.3*  --  2.1*  --    < > = values in this interval not displayed.     Liver Function Tests: Recent Labs  Lab 10/20/22 1715 10/22/22 0217 10/23/22 1631 10/24/22 0446  AST 53* >10,000* 3,777* 2,247*  ALT 59* 5,030* 5,167* 4,555*  ALKPHOS 32* 94 119 104  BILITOT 1.6* 2.6* 2.2* 2.2*  PROT 7.1 5.8* 6.0* 5.9*  ALBUMIN 3.2* 2.6* 2.7* 2.6*    No results for input(s): "LIPASE", "AMYLASE" in the last 168 hours. No results for input(s): "AMMONIA" in the last 168 hours.   This patient is critically ill with multiple organ system failure which requires frequent high complexity decision making, assessment, support, evaluation, and titration of therapies. This was completed through the application of advanced monitoring technologies and extensive interpretation of multiple databases. During this  encounter critical care time was devoted to patient care services described in this note for 36 minutes.  Julian Hy, DO 10/24/22 6:50 PM [facility]  For contact information, see Amion. If no response to pager, please call PCCM consult pager. After hours, 7PM- 7AM, please call [facility].

## 2022-10-25 ENCOUNTER — Inpatient Hospital Stay (HOSPITAL_COMMUNITY): Payer: Medicare HMO

## 2022-10-25 DIAGNOSIS — I4891 Unspecified atrial fibrillation: Secondary | ICD-10-CM | POA: Diagnosis not present

## 2022-10-25 DIAGNOSIS — J9600 Acute respiratory failure, unspecified whether with hypoxia or hypercapnia: Secondary | ICD-10-CM | POA: Diagnosis not present

## 2022-10-25 DIAGNOSIS — J810 Acute pulmonary edema: Secondary | ICD-10-CM | POA: Diagnosis not present

## 2022-10-25 DIAGNOSIS — Z9911 Dependence on respirator [ventilator] status: Secondary | ICD-10-CM | POA: Diagnosis not present

## 2022-10-25 DIAGNOSIS — R570 Cardiogenic shock: Secondary | ICD-10-CM | POA: Diagnosis not present

## 2022-10-25 DIAGNOSIS — R4182 Altered mental status, unspecified: Secondary | ICD-10-CM | POA: Diagnosis not present

## 2022-10-25 DIAGNOSIS — J9601 Acute respiratory failure with hypoxia: Secondary | ICD-10-CM | POA: Diagnosis not present

## 2022-10-25 LAB — COOXEMETRY PANEL
Carboxyhemoglobin: 2.8 % — ABNORMAL HIGH (ref 0.5–1.5)
Carboxyhemoglobin: 2.3 % — ABNORMAL HIGH (ref 0.5–1.5)
Methemoglobin: <0.7 % (ref 0.0–1.5)
Methemoglobin: <0.7 % (ref 0.0–1.5)
O2 Saturation: 71.5 %
O2 Saturation: 81 %
Total hemoglobin: 12.3 g/dL (ref 12.0–16.0)
Total hemoglobin: 10.5 g/dL — ABNORMAL LOW (ref 12.0–16.0)

## 2022-10-25 LAB — MAGNESIUM: Magnesium: 2.5 mg/dL — ABNORMAL HIGH (ref 1.7–2.4)

## 2022-10-25 LAB — CBC
HCT: 34.1 % — ABNORMAL LOW (ref 39.0–52.0)
Hemoglobin: 11.9 g/dL — ABNORMAL LOW (ref 13.0–17.0)
MCH: 29.6 pg (ref 26.0–34.0)
MCHC: 34.9 g/dL (ref 30.0–36.0)
MCV: 84.8 fL (ref 80.0–100.0)
Platelets: 125 10*3/uL — ABNORMAL LOW (ref 150–400)
RBC: 4.02 MIL/uL — ABNORMAL LOW (ref 4.22–5.81)
RDW: 14.6 % (ref 11.5–15.5)
WBC: 24.9 10*3/uL — ABNORMAL HIGH (ref 4.0–10.5)
nRBC: 19.2 % — ABNORMAL HIGH (ref 0.0–0.2)

## 2022-10-25 LAB — GLUCOSE, CAPILLARY
Glucose-Capillary: 141 mg/dL — ABNORMAL HIGH (ref 70–99)
Glucose-Capillary: 153 mg/dL — ABNORMAL HIGH (ref 70–99)
Glucose-Capillary: 157 mg/dL — ABNORMAL HIGH (ref 70–99)
Glucose-Capillary: 170 mg/dL — ABNORMAL HIGH (ref 70–99)
Glucose-Capillary: 175 mg/dL — ABNORMAL HIGH (ref 70–99)
Glucose-Capillary: 175 mg/dL — ABNORMAL HIGH (ref 70–99)
Glucose-Capillary: 202 mg/dL — ABNORMAL HIGH (ref 70–99)

## 2022-10-25 LAB — POCT ACTIVATED CLOTTING TIME
Activated Clotting Time: 190 seconds
Activated Clotting Time: 179 seconds

## 2022-10-25 LAB — BASIC METABOLIC PANEL
Anion gap: 25 — ABNORMAL HIGH (ref 5–15)
BUN: 126 mg/dL — ABNORMAL HIGH (ref 8–23)
CO2: 23 mmol/L (ref 22–32)
Calcium: 8.2 mg/dL — ABNORMAL LOW (ref 8.9–10.3)
Chloride: 95 mmol/L — ABNORMAL LOW (ref 98–111)
Creatinine, Ser: 6.54 mg/dL — ABNORMAL HIGH (ref 0.61–1.24)
GFR, Estimated: 9 mL/min — ABNORMAL LOW (ref >60)
Glucose, Bld: 187 mg/dL — ABNORMAL HIGH (ref 70–99)
Potassium: 3.1 mmol/L — ABNORMAL LOW (ref 3.5–5.1)
Sodium: 143 mmol/L (ref 135–145)

## 2022-10-25 LAB — PHOSPHORUS: Phosphorus: 9 mg/dL — ABNORMAL HIGH (ref 2.5–4.6)

## 2022-10-25 LAB — TRIGLYCERIDES: Triglycerides: 86 mg/dL (ref <150)

## 2022-10-25 LAB — HEPARIN LEVEL (UNFRACTIONATED): Heparin Unfractionated: 0.61 IU/mL (ref 0.30–0.70)

## 2022-10-25 LAB — PATHOLOGIST SMEAR REVIEW

## 2022-10-25 LAB — APTT: aPTT: 76 seconds — ABNORMAL HIGH (ref 24–36)

## 2022-10-25 MED ORDER — ATROPINE SULFATE 1 MG/10ML IJ SOSY
PREFILLED_SYRINGE | INTRAMUSCULAR | Status: AC
  Filled 2022-10-25: qty 10

## 2022-10-25 MED ORDER — LEVETIRACETAM IN NACL 500 MG/100ML IV SOLN
500.0000 mg | INTRAVENOUS | Status: DC
  Administered 2022-10-26: 500 mg via INTRAVENOUS
  Filled 2022-10-25: qty 100

## 2022-10-25 MED ORDER — HEPARIN (PORCINE) 25000 UT/250ML-% IV SOLN
1300.0000 [IU]/h | INTRAVENOUS | Status: DC
  Administered 2022-10-26: 1450 [IU]/h via INTRAVENOUS
  Administered 2022-10-26: 1400 [IU]/h via INTRAVENOUS
  Administered 2022-10-27: 1450 [IU]/h via INTRAVENOUS
  Administered 2022-10-28: 1400 [IU]/h via INTRAVENOUS
  Filled 2022-10-25 (×4): qty 250

## 2022-10-25 MED ORDER — PIPERACILLIN-TAZOBACTAM IN DEX 2-0.25 GM/50ML IV SOLN
2.2500 g | Freq: Three times a day (TID) | INTRAVENOUS | Status: DC
  Administered 2022-10-25 – 2022-10-27 (×7): 2.25 g via INTRAVENOUS
  Filled 2022-10-25 (×7): qty 50

## 2022-10-25 MED ORDER — POTASSIUM CHLORIDE 10 MEQ/50ML IV SOLN
10.0000 meq | INTRAVENOUS | Status: AC
  Administered 2022-10-25 (×4): 10 meq via INTRAVENOUS
  Filled 2022-10-25 (×4): qty 50

## 2022-10-25 MED ORDER — SODIUM CHLORIDE 0.9 % IV SOLN: INTRAVENOUS | Status: DC | PRN

## 2022-10-25 MED FILL — Medication: Qty: 1 | Status: AC

## 2022-10-25 NOTE — Progress Notes (Addendum)
Advanced Heart Failure Rounding Note   Subjective:    Remains intubated and sedated. Continues to show signs of ABI w/ temperature dysregulation, seizures and myoclonic jerks.   On Milrinone 0.25 + NE 6. IABP remains 1:1.   Cardiac indices improved  Swan#s  PAP: (24-48)/(13-25) 25/13 CVP:  [7 mmHg-10 mmHg] 7 mmHg PCWP:  [16 mmHg-18 mmHg] 18 mmHg CO:  [4 L/min-7.3 L/min] 6.5 L/min CI:  [1.9 L/min/m2-3.5 L/min/m2] 3.1 L/min/m2 Co-ox 72%    SCr trending up 5.4>>6.29>>5.54 today. BUN 126. Nonoliguric, 3L in UOP yesterday.   Family present at bedside.   Objective:   Weight Range:  Vital Signs:   Temp:  [94.8 F (34.9 C)-98.2 F (36.8 C)] 95.7 F (35.4 C) (11/28 0845) Pulse Rate:  [74-230] 199 (11/28 0845) Resp:  [14-30] 16 (11/28 0845) BP: (88-121)/(58-95) 109/76 (11/28 0800) SpO2:  [91 %-100 %] 100 % (11/28 0845) Arterial Line BP: (79-130)/(59-88) 90/79 (11/28 0845) FiO2 (%):  [40 %] 40 % (11/28 0400) Weight:  [95.3 kg] 95.3 kg (11/28 0400) Last BM Date : 10/25/22  Weight change: Filed Weights   10/23/22 0500 10/24/22 0431 10/25/22 0400  Weight: 98.3 kg 98.9 kg 95.3 kg    Intake/Output:   Intake/Output Summary (Last 24 hours) at 10/25/2022 0945 Last data filed at 10/25/2022 0900 Gross per 24 hour  Intake 2583.91 ml  Output 3010 ml  Net -426.09 ml    PHYSICAL EXAM: CVP 7 General:  intubated and sedated  HEENT: normal + ETT + continuous EEG  Neck: supple. JVD 8 cm. + lt IJ swan Carotids 2+ bilat; no bruits. No lymphadenopathy or thyromegaly appreciated. Cor: PMI nondisplaced. Regular rate & rhythm. No rubs, gallops or murmurs. Lungs: intubated and clear  Abdomen: soft, nontender, nondistended. No hepatosplenomegaly. No bruits or masses. Good bowel sounds. Extremities: no cyanosis, clubbing, rash, trace b/l LE edema Lt fem IABP  Neuro: intubated and sedated, + continuous EEG    Telemetry: NSR w/ PACs 70s, NSVT Personally reviewed    Labs: Basic  Metabolic Panel: Recent Labs  Lab 10/21/22 2035 10/21/22 2316 10/22/22 0217 10/22/22 0220 10/23/22 0508 10/23/22 1631 10/23/22 1639 10/24/22 0422 10/24/22 0446 10/24/22 1756 10/25/22 0457  NA 144   < > 142   < > 142 142 139 138 140 143 143  K 3.3*   < > 3.3*   < > 3.6 3.4* 3.2* 3.3* 3.4* 3.7 3.1*  CL 99  --  93*   < > 93* 92*  --   --  88* 92* 95*  CO2 19*  --  23   < > 26 24  --   --  '25 26 23  '$ GLUCOSE 344*  --  338*   < > 153* 140*  --   --  130* 118* 187*  BUN 40*  --  46*   < > 71* 83*  --   --  96* 109* 126*  CREATININE 3.35*  --  3.88*   < > 5.43* 5.94*  --   --  6.29* 6.48* 6.54*  CALCIUM 6.9*  --  7.8*   < > 8.2* 7.8*  --   --  8.5* 8.3* 8.2*  MG 1.9  --  2.3  --   --  2.5*  --   --  2.5*  --  2.5*  PHOS  --   --  5.1*  --   --   --   --   --   --   --  9.0*   < > = values in this interval not displayed.    Liver Function Tests: Recent Labs  Lab 10/20/22 1715 10/22/22 0217 10/23/22 1631 10/24/22 0446  AST 53* >10,000* 3,777* 2,247*  ALT 59* 5,030* 5,167* 4,555*  ALKPHOS 32* 94 119 104  BILITOT 1.6* 2.6* 2.2* 2.2*  PROT 7.1 5.8* 6.0* 5.9*  ALBUMIN 3.2* 2.6* 2.7* 2.6*   No results for input(s): "LIPASE", "AMYLASE" in the last 168 hours. No results for input(s): "AMMONIA" in the last 168 hours.  CBC: Recent Labs  Lab 10/20/22 1715 10/20/22 1957 10/21/22 2335 10/22/22 0220 10/22/22 1619 10/22/22 1640 10/23/22 1631 10/23/22 1639 10/24/22 0422 10/24/22 0446 10/25/22 0457  WBC 10.6*   < > 23.9*  --  29.8*  --  28.7*  --   --  26.4* 24.9*  NEUTROABS 7.7  --   --   --   --   --   --   --   --   --   --   HGB 13.1   < > 11.3*   < > 11.4*   < > 11.3* 11.9* 11.6* 11.5* 11.9*  HCT 37.5*   < > 31.6*   < > 31.9*   < > 32.3* 35.0* 34.0* 32.8* 34.1*  MCV 85.2   < > 85.6  --  84.6  --  85.4  --   --  85.2 84.8  PLT 236   < > 175  --  138*  --  122*  --   --  118* 125*   < > = values in this interval not displayed.    Cardiac Enzymes: No results for input(s):  "CKTOTAL", "CKMB", "CKMBINDEX", "TROPONINI" in the last 168 hours.  BNP: BNP (last 3 results) Recent Labs    10/20/22 1715  BNP 1,300.4*    ProBNP (last 3 results) No results for input(s): "PROBNP" in the last 8760 hours.    Other results:  Imaging: DG CHEST PORT 1 VIEW  Result Date: 10/25/2022 CLINICAL DATA:  Intra-aortic balloon pump.  Ventilator support. EXAM: PORTABLE CHEST 1 VIEW COMPARISON:  10/24/2022 FINDINGS: Endotracheal tube tip 3 cm above the carina. Orogastric or nasogastric to enters the stomach. Swan-Ganz catheter tip in the right middle or lower lobe pulmonary artery. Pacemaker defibrillator remains in place. Aortic balloon pump marker is slightly more distal, now within the upper descending aorta just below the level of the arch. Left lower lobe infiltrate/atelectasis persists, probably with some left pleural fluid. IMPRESSION: Aortic balloon pump marker is slightly more distal, now within the upper descending aorta just below the level of the arch. Persistent left lower lobe atelectasis and or pneumonia with a small amount pleural fluid. Electronically Signed   By: Nelson Chimes M.D.   On: 10/25/2022 07:25   DG CHEST PORT 1 VIEW  Result Date: 10/24/2022 CLINICAL DATA:  ETT placement EXAM: PORTABLE CHEST 1 VIEW COMPARISON:  10/22/2022 FINDINGS: Endotracheal tube with the tip 4 cm above the carina. Swan-Ganz catheter with the tip projecting over the right main pulmonary artery. Nasogastric tube coursing below the diaphragm. Left lower lobe airspace disease. Trace bilateral pleural effusions. No pneumothorax. Stable cardiomegaly. No acute osseous abnormality. IMPRESSION: 1. Support lines and tubing in satisfactory position. 2. Left lower lobe airspace disease which may reflect atelectasis versus pneumonia. Electronically Signed   By: Kathreen Devoid M.D.   On: 10/24/2022 11:04   CT HEAD WO CONTRAST (5MM)  Result Date: 10/23/2022 CLINICAL DATA:  Seizure. EXAM: CT HEAD  WITHOUT  CONTRAST TECHNIQUE: Contiguous axial images were obtained from the base of the skull through the vertex without intravenous contrast. RADIATION DOSE REDUCTION: This exam was performed according to the departmental dose-optimization program which includes automated exposure control, adjustment of the mA and/or kV according to patient size and/or use of iterative reconstruction technique. COMPARISON:  10/21/2022 FINDINGS: Brain: There is no evidence for acute hemorrhage, hydrocephalus, mass lesion, or abnormal extra-axial fluid collection. No definite CT evidence for acute infarction. Vascular: No hyperdense vessel or unexpected calcification. Skull: No evidence for fracture. No worrisome lytic or sclerotic lesion. Sinuses/Orbits: Chronic mucosal disease noted maxillary and ethmoid sinuses. No evidence for mastoid effusion. Visualized portions of the globes and intraorbital fat are unremarkable. Other: None. IMPRESSION: 1. No acute intracranial abnormality. 2. Chronic paranasal sinus disease. Electronically Signed   By: Misty Stanley M.D.   On: 10/23/2022 09:58     Medications:     Scheduled Medications:  Chlorhexidine Gluconate Cloth  6 each Topical Daily   clonazepam  1 mg Per Tube TID   docusate  100 mg Per Tube BID   feeding supplement (PROSource TF20)  60 mL Per Tube QID   hydrocortisone sod succinate (SOLU-CORTEF) inj  100 mg Intravenous Q12H   insulin aspart  0-15 Units Subcutaneous Q4H   insulin glargine-yfgn  20 Units Subcutaneous Daily   mouth rinse  15 mL Mouth Rinse Q2H   pantoprazole (PROTONIX) IV  40 mg Intravenous Daily   polyethylene glycol  17 g Per Tube Daily   sodium bicarbonate  100 mEq Intravenous Once   sodium chloride flush  3 mL Intravenous Q12H   sodium chloride flush  3 mL Intravenous Q12H    Infusions:  sodium chloride Stopped (10/23/22 1849)   sodium chloride 10 mL/hr (10/24/22 0211)   sodium chloride Stopped (10/25/22 0800)   sodium chloride     amiodarone 30  mg/hr (10/25/22 0900)   epinephrine Stopped (10/22/22 0245)   feeding supplement (VITAL 1.5 CAL) 20 mL/hr at 10/25/22 0900   heparin 1,400 Units/hr (10/25/22 0900)   [START ON 10/26/2022] levETIRAcetam     milrinone 0.25 mcg/kg/min (10/25/22 0900)   norepinephrine (LEVOPHED) Adult infusion 6 mcg/min (10/25/22 0900)   piperacillin-tazobactam (ZOSYN)  IV Stopped (10/25/22 0141)   potassium chloride 10 mEq (10/25/22 0941)   propofol (DIPRIVAN) infusion 30 mcg/kg/min (10/25/22 0900)    PRN Medications: sodium chloride, sodium chloride, acetaminophen **OR** acetaminophen, artificial tears, fentaNYL (SUBLIMAZE) injection, influenza vac split quadrivalent PF, LORazepam, midazolam, ondansetron (ZOFRAN) IV, ondansetron **OR** [DISCONTINUED] ondansetron (ZOFRAN) IV, mouth rinse, pneumococcal 20-valent conjugate vaccine, senna-docusate, sodium chloride flush   Assessment/Plan:   1. 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 EF 35% in 2020. S/p [employer] ICD - EF 35% -> 20% in setting of AF - Echo 10/21/22 EF 20% RV moderately reduced - S/p PEA arrest 11/23 - Now on IABP 1:1, NE 6 + Milrinone 0.25. Co-ox 72%, CI 3.1   - Plan IABP wean today, reduce to 1:2>>1:3. If CI remains ~2.3-2.5 or greater, will remove IABP and swan to allow neuro assessment w/ brain MRI  - Continue current milrinone and NE   2. Anoxic brain injury - having seizures/myoclonus -> propofol/Keppra - Brain CT 10/21/22 - normal - Repeat CT 11/26, normal.   - now w/ temp dysregulation  - D/w neurology. Suspect poor prognosis - Will plan MRI this evening after IABP out  3. PEA arrest - 10/20/22 -  CPR x 6 mins  4. AF, persistent - likely cause of tachy CM - was in NSR on 11/24 now in AF/MAT but rate improved - Continue heparin/IV amio  5. Non oliguric AKI - due to ATN/shock - SCr 0.8 -> 3.88 -> 5.43->6.29 ->6.54 - Continue hemodynamic support - Consider CRRT pending  Neuro prognostication. Currently not acidotic or markedly volume overloaded so can wait one more day and reassess.   6. Acute hypoxic respiratory failure - due to HF/cardiac arrest - remains intubated  7. Shock liver - continue supportive care  8. Hypocalcemia/hypokalemia - supp PRN   9. H/o AML  - s/p chemo 2019   Length of Stay: 5   Brendan Ellis  10/25/2022, 9:45 AM  Advanced Heart Failure Team Pager [phone] (M-F; [place])  Please contact [facility] for night-coverage after hours (4p -7a ) and weekends on [email or web address]   Agree with above.   Remains sedated on vent. On NE, milrinone and IABP. Hemodynamics much improved. Has recurrent seizures when sedation weaned. Now with loss of temperature control. Swan numbers as above. Scr now plateauing. Making urine   General:  Intubated/sedated on warming blanket  HEENT: normal + ETT Neck: supple. RIJ swan  Carotids 2+ bilat; no bruits. No lymphadenopathy or thryomegaly appreciated. Cor: PMI nondisplaced. Irregular rate & rhythm. No rubs, gallops or murmurs. Lungs: clear Abdomen: soft, nontender, nondistended. No hepatosplenomegaly. No bruits or masses. Good bowel sounds. Extremities: no cyanosis, clubbing, rash, edema LFA IABP  Neuro: intubated/sedated  Ongoing concern for severe anoxic brain injury post-arrest. Cardiac hemodynamics improved with adjustment of inotropes. Will wean IABP and swan today with plan for brain MRI tonight to assist with prognostication. No indication of HD at this point. Family updated at bedside.   CRITICAL CARE Performed by: Glori Bickers  Total critical care time: 40 minutes  Critical care time was exclusive of separately billable procedures and treating other patients.  Critical care was necessary to treat or prevent imminent or life-threatening deterioration.  Critical care was time spent personally by me (independent of midlevel providers or residents) on the following  activities: development of treatment plan with patient and/or surrogate as well as nursing, discussions with consultants, evaluation of patient's response to treatment, examination of patient, obtaining history from patient or surrogate, ordering and performing treatments and interventions, ordering and review of laboratory studies, ordering and review of radiographic studies, pulse oximetry and re-evaluation of patient's condition.  Glori Bickers, MD  4:11 PM

## 2022-10-25 NOTE — Progress Notes (Signed)
[facility] Progress Note Patient Name: Brendan Ellis DOB: November 04, 1956 MRN: [id number]   Date of Service  10/25/2022  HPI/Events of Note  Called to put in order for MRI.  Pt s/p cardiac arrest, EEG showing severe diffuse encephalopathy, likely due to anoxic/hypoxic brain injury.  Planned for MRI, but deferred as he had a balloon pump in place.  He also has a AICD in place, which is said to be compatible with MRI.   eICU Interventions  Tried looking for documentation that his [place] is compatible with MRI, however, was not able to find his model number.  Moreover, on [employer]'s site, they have  checklists for the cardiologist and radiologist, requiring information that is not available to me.  Will hold off imaging this evening.   Will continue current medical care in the meantime.         Duc Crocket M DELA CRUZ 10/25/2022, 10:33 PM  2:51 AM Received secure chat from Dr. Missy Sabins. He states that the patient's AICD is MRI compatible and has been set to MRI mode by the rep.  Placed repeat MRI order. Awaiting radiology approval.

## 2022-10-25 NOTE — Progress Notes (Signed)
[facility] Progress Note Patient Name: Brendan Ellis DOB: [birth year] MRN: [id number]   Date of Service  10/25/2022  HPI/Events of Note  K+ 3.1 GFR <9, creat 6.54 He is NPO; he has a central line.  eICU Interventions  Ordered K total of 40 meqs Further correction as per bedside rounding team     Intervention Category Intermediate Interventions: Electrolyte abnormality - evaluation and management  Judd Lien 10/25/2022, 7:02 AM

## 2022-10-25 NOTE — Procedures (Addendum)
Patient Name: Brendan Ellis  MRN: [id number]  Epilepsy Attending: Lora Havens  Referring Physician/Provider: Jacky Kindle, MD  Duration: 10/24/2022 1844 to 10/25/2022 1142   Patient history: 66yo M s/p cardiac arrest now with myoclonic jerks. EEG to evaluate for seizure.   Level of alertness: comatose   AEDs during EEG study: LEV, Clonazepam, Propofol   Technical aspects: This EEG study was done with scalp electrodes positioned according to the 10-20 International system of electrode placement. Electrical activity was reviewed with band pass filter of 1-'70Hz'$ , sensitivity of 7 uV/mm, display speed of 24m/sec with a '60Hz'$  notched filter applied as appropriate. EEG data were recorded continuously and digitally stored.  Video monitoring was available and reviewed as appropriate.   Description: EEG showed near continuous generalized mixed frequencies with predominantly 8-'9hz'$  alpha activity admixed with intermittent generalized 2 to 3 Hz delta slowing as well as low amplitude 12 to 13 Hz beta activity.    ABNORMALITY -Continuous slow, generalized   IMPRESSION: This study is suggestive of severe diffuse encephalopathy, nonspecific etiology. No definite seizures or epileptiform discharges were seen during the study.    Monti Jilek OBarbra Sarks

## 2022-10-25 NOTE — Progress Notes (Addendum)
ANTICOAGULATION CONSULT NOTE  Pharmacy Consult for heparin Indication:  IABP + atrial flutter  No Known Allergies  Patient Measurements: Height: '6\' 2"'$  (188 cm) Weight: 95.3 kg (210 lb 1.6 oz) IBW/kg (Calculated) : 82.2 Heparin Dosing Weight: 88 kg  Vital Signs: Temp: 95.7 F (35.4 C) (11/28 0845) Temp Source: Core (11/28 0800) BP: 109/76 (11/28 0800) Pulse Rate: 199 (11/28 0845)  Labs: Recent Labs    10/23/22 0508 10/23/22 1631 10/23/22 1639 10/24/22 0422 10/24/22 0446 10/24/22 1756 10/25/22 0457  HGB  --  11.3*   < > 11.6* 11.5*  --  11.9*  HCT  --  32.3*   < > 34.0* 32.8*  --  34.1*  PLT  --  122*  --   --  118*  --  125*  APTT 71*  --   --   --  79*  --  76*  HEPARINUNFRC 0.89*  --   --   --  0.78*  --  0.61  CREATININE 5.43* 5.94*  --   --  6.29* 6.48* 6.54*   < > = values in this interval not displayed.     Estimated Creatinine Clearance: 13.3 mL/min (A) (by C-G formula based on SCr of 6.54 mg/dL (H)).   Medical History: Past Medical History:  Diagnosis Date   Hypertension    Leukemia ([place])    PAF (paroxysmal atrial fibrillation) ([place]) 04/27/2018    Medications:  Eliquis 5 mg BID PTA   Assessment: Mr. Choinski presented with increasing dyspnea on exertion for the past 2 weeks. Was found to be in atrial flutter with RVR on initial EKG. Due to worsening respiratory status, he was intubated overnight and started on vasopressor support. He went into PEA arrest and required 6 minutes of CPR and epi prior to ROSC. Given cardiogenic shock, he was taken to the cath lab this morning and an IABP was placed for further support. He has been taking Eliquis PTA for atrial flutter and last dose was 11/23 at 0900. Pharmacy has been consulted for heparin dosing.   aPTT is therapeutic at 76 this morning, on 1400 units/hr. Heparin level of 0.61 close to correlating with heparin level. Hgb 11.9, PLTs 125 stable. Per RN, no issues with heparin infusion, but some bleeding noted  from groin site since around yesterday afternoon that has slowed down this morning.   Goal of Therapy:  Heparin level 0.3-0.5 units/ml aPTT 66-85 seconds Monitor platelets by anticoagulation protocol: Yes   Plan:  Continue heparin infusion at 1400 units/hr for now, plan for potential IABP removal this afternoon Monitor heparin level, aPTT, CBC, renal function, and s/sx of bleeding daily.  Eliseo Gum, PharmD PGY1 Pharmacy Resident   10/25/2022  9:20 AM    ADDENDUM: IABP pulled at 12:56. Discussed with MD. Faythe Ghee to restart heparin 6 hours after IABP pull. Given previously therapeutic at 1400 units/hr, will resume at this rate on 11/28 at 1900. Will check levels with AM labs.   Eliseo Gum, PharmD PGY1 Pharmacy Resident   10/25/2022  1:28 PM

## 2022-10-25 NOTE — Progress Notes (Signed)
IABP was removed from patient's LFA using manual pressure.  Pressure held for 30 minutes with no complications noted.  A tegaderm was applied to the site after hold was completed and his nurse checked ht e area before I left.  Vital signs: WNL

## 2022-10-25 NOTE — Progress Notes (Signed)
NAME:  Brendan Ellis, MRN:  [id number], DOB:  [birth year], LOS: 5 ADMISSION DATE:  10/20/2022, CONSULTATION DATE:  10/21/2022 REFERRING MD:  Dr. Posey Pronto, [facility], CHIEF COMPLAINT:  Short of breath   History of Present Illness:  66 yo male former smoker developed cough, sore throat and dyspnea about 1 week prior to admission.  Started on omnicef by his PCP, but symptoms progressed.  He presented to [facility] on 10/20/22.  He is followed by cardiology in [facility] for chemo induced CM and a fib.  He had A fib with RVR in the ER and started on amiodarone.  CT angio chest 2.2 x 1.8 cm pretracheal LN, small chronic loculated effusions, consolidative ASD in lower lobes, and interlobular septal thickening.  Started on IV ABx, diuretics and supplemental oxygen.  He was tried on Bipap but wasn't able to tolerate this.  PCCM consulted to arrange for management in ICU.  Pertinent  Medical History  Non ischemic dilated CM with HFrEF, s/p AICD, PAF on eliquis, Leukemia 2019, HTN  Significant Hospital Events: Including procedures, antibiotic start and stop dates in addition to other pertinent events   11/23 Admit 11/24 Transfer to ICU 11/26 seizure on EEG with sedation paused  Interim History / Subjective:  No response today neurologically. Brother at bedside today.   Objective   Blood pressure 112/76, pulse (!) 197, temperature (!) 97.5 F (36.4 C), resp. rate 15, height '6\' 2"'$  (1.88 m), weight 95.3 kg, SpO2 100 %. PAP: (21-41)/(13-31) 35/17 CVP:  [7 mmHg-20 mmHg] 7 mmHg PCWP:  [16 mmHg-18 mmHg] 18 mmHg CO:  [5.4 L/min-7.3 L/min] 5.4 L/min CI:  [2.6 L/min/m2-3.5 L/min/m2] 2.6 L/min/m2  Vent Mode: PRVC FiO2 (%):  [40 %] 40 % Set Rate:  [14 bmp] 14 bmp Vt Set:  [560 mL-650 mL] 560 mL PEEP:  [5 cmH20] 5 cmH20 Plateau Pressure:  [18 cmH20-19 cmH20] 19 cmH20   Intake/Output Summary (Last 24 hours) at 10/25/2022 1638 Last data filed at 10/25/2022 1600 Gross per 24 hour  Intake 2630.69 ml  Output 3121 ml  Net  -490.31 ml   UOP 2960  Filed Weights   10/23/22 0500 10/24/22 0431 10/25/22 0400  Weight: 98.3 kg 98.9 kg 95.3 kg    Examination:   Physical exam: General: critically ill appearing man lying in bed in NAD HEENT: [employer]/AT, eyes anicteric. ETT in place.  Neuro: RASS -5, on propofol. PERRL. Cough reflex- delayed. No pharyngeal gag. No response to trapezius squeeze or nailbed pressure. Chest: CTAB, clear ETT secretions Heart: S1S2, RRR Abdomen: soft, NT Skin: warm, dry, no rashes  Potassium 3.1 BUN 126 Creatinine 6.54 WBC 24.9 H/H11.9/34.1 Co. ox 71.5% CXR personally reviewed- mild pulm edema, cardiomegaly, possible LL infiltrate. ETT & IABP.   Resolved Hospital Problem list     Assessment & Plan:  Acute hypoxic respiratory failure with aspiration pneumonia and pulmonary edema -LTVV -VAP prevention protocol -PAD protocol -daily SAT & SBT when appropriate- mental status precludes safe extubatation -stress dose steroids  Likely septic shock due to aspiration pneumonia -empiric zosyn -stress dose steroids  Paroxysmal A fib with RVR Acute on chronic HFrEF with cardiogenic shock S/p PEA cardiac arrest in the setting of hypoxia -IABP weaned today -milrinone, NE -heparin -con't amiodarone -Continue heparin -Appreciate AHF's management  Acute metabolic, possibly anoxic encephalopathy  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  Brother updated at bedside today.  We will have more information to help prognosticate after MRI.  Best Practice (right click and "Reselect all SmartList Selections" daily)   Diet/type: tubefeeds DVT prophylaxis: Systemic heparin GI prophylaxis: PPI Lines: Yes needed Foley: Yes still needed Code Status:  full code Last date of multidisciplinary goals of care discussion [11/26 patient's family was updated   Labs   CBC: Recent Labs  Lab 10/20/22 1715 10/20/22 1957 10/21/22 2335 10/22/22 0220 10/22/22 1619 10/22/22 1640 10/23/22 1631 10/23/22 1639 10/24/22 0422 10/24/22 0446 10/25/22 0457  WBC 10.6*   < > 23.9*  --  29.8*  --  28.7*  --   --  26.4* 24.9*  NEUTROABS 7.7  --   --   --   --   --   --   --   --   --   --   HGB 13.1   < > 11.3*   < > 11.4*   < > 11.3* 11.9* 11.6* 11.5* 11.9*  HCT 37.5*   < > 31.6*   < > 31.9*   < > 32.3* 35.0* 34.0* 32.8* 34.1*  MCV 85.2   < > 85.6  --  84.6  --  85.4  --   --  85.2 84.8  PLT 236   < > 175  --  138*  --  122*  --   --  118* 125*   < > = values in this interval not displayed.     Basic Metabolic Panel: Recent Labs  Lab 10/21/22 2035 10/21/22 2316 10/22/22 0217 10/22/22 0220 10/23/22 0508 10/23/22 1631 10/23/22 1639 10/24/22 0422 10/24/22 0446 10/24/22 1756 10/25/22 0457  NA 144   < > 142   < > 142 142 139 138 140 143 143  K 3.3*   < > 3.3*   < > 3.6 3.4* 3.2* 3.3* 3.4* 3.7 3.1*  CL 99  --  93*   < > 93* 92*  --   --  88* 92* 95*  CO2 19*  --  23   < > 26 24  --    --  '25 26 23  '$ GLUCOSE 344*  --  338*   < > 153* 140*  --   --  130* 118* 187*  BUN 40*  --  46*   < > 71* 83*  --   --  96* 109* 126*  CREATININE 3.35*  --  3.88*   < > 5.43* 5.94*  --   --  6.29* 6.48* 6.54*  CALCIUM 6.9*  --  7.8*   < > 8.2* 7.8*  --   --  8.5* 8.3* 8.2*  MG 1.9  --  2.3  --   --  2.5*  --   --  2.5*  --  2.5*  PHOS  --   --  5.1*  --   --   --   --   --   --   --  9.0*   < > = values in this interval not displayed.    GFR: Estimated Creatinine Clearance: 13.3 mL/min (A) (by C-G formula based on SCr of 6.54 mg/dL (H)). Recent Labs  Lab 10/22/22 0541 10/22/22 1619 10/23/22 0555 10/23/22 1631 10/23/22 2055 10/24/22 0446 10/25/22 0457  WBC  --  29.8*  --  28.7*  --  26.4* 24.9*  LATICACIDVEN 5.8* 3.1* 2.3*  --  2.1*  --   --      Liver Function Tests: Recent Labs  Lab 10/20/22 1715 10/22/22 0217 10/23/22 1631 10/24/22 0446  AST 53* >10,000* 3,777* 2,247*  ALT 59* 5,030* 5,167* 4,555*  ALKPHOS 32* 94 119 104  BILITOT 1.6* 2.6* 2.2* 2.2*  PROT 7.1 5.8* 6.0* 5.9*  ALBUMIN 3.2* 2.6* 2.7* 2.6*    No results for input(s): "LIPASE", "AMYLASE" in the last 168 hours. No results for input(s): "AMMONIA" in the last 168 hours.   This patient is critically ill with multiple organ system failure which requires frequent high complexity decision making, assessment, support, evaluation, and titration of therapies. This was completed through the application of advanced monitoring technologies and extensive interpretation of multiple databases. During this encounter critical care time was devoted to patient care services described in this note for 40 minutes.  Julian Hy, DO 10/25/22 6:36 PM [facility]  For contact information, see Amion. If no response to pager, please call PCCM consult pager. After hours, 7PM- 7AM, please call Elink.

## 2022-10-25 NOTE — Procedures (Signed)
Arterial Catheter Insertion Procedure Note  Brendan Ellis  [id number]  Dec 25, 1955  Date:10/25/22  Time:10:44 AM    Provider Performing: Montey Hora    Procedure: Insertion of Arterial Line [phone]) with US guidance ([id number])   Indication(s) Blood pressure monitoring and/or need for frequent ABGs  Consent Risks of the procedure as well as the alternatives and risks of each were explained to the patient and/or caregiver.  Consent for the procedure was obtained and is signed in the bedside chart  Anesthesia None   Time Out Verified patient identification, verified procedure, site/side was marked, verified correct patient position, special equipment/implants available, medications/allergies/relevant history reviewed, required imaging and test results available.   Sterile Technique Maximal sterile technique including full sterile barrier drape, hand hygiene, sterile gown, sterile gloves, mask, hair covering, sterile ultrasound probe cover (if used).   Procedure Description Area of catheter insertion was cleaned with chlorhexidine and draped in sterile fashion. With real-time ultrasound guidance an arterial catheter was placed into the right radial artery.  Appropriate arterial tracings confirmed on monitor.     Complications/Tolerance None; patient tolerated the procedure well.   EBL Minimal   Specimen(s) None    Montey Hora, PA - [facility] For pager details, please see AMION or use Epic chat  After 1900, please call [place] for cross coverage needs 10/25/2022, 10:44 AM

## 2022-10-25 NOTE — Progress Notes (Addendum)
Neurology Progress Note  Patient ID: Brendan Ellis is a 66 y.o. with PMHx of  has a past medical history of Hypertension, Leukemia ([place]), and PAF (paroxysmal atrial fibrillation) ([place]) (04/27/2018).   Major interval events/Subjective: - Worsening myoclonic twitching overnight when trying to wean propofol, lowest rate was 10 mcg/hr but had to restart to allow for care - Improving cardiac output, discontinuing balloon pump today  - Worsening renal function and uremia  Exam: Current vital signs: BP 115/77   Pulse 96   Temp (!) 95.2 F (35.1 C)   Resp 17   Ht '6\' 2"'$  (1.88 m)   Wt 95.3 kg   SpO2 97%   BMI 26.98 kg/m  Vital signs in last 24 hours: Temp:  [94.8 F (34.9 C)-98.2 F (36.8 C)] 95.2 F (35.1 C) (11/28 0600) Pulse Rate:  [74-196] 96 (11/28 0354) Resp:  [14-26] 17 (11/28 0700) BP: (88-122)/(58-95) 115/77 (11/28 0700) SpO2:  [91 %-100 %] 97 % (11/28 0700) Arterial Line BP: (89-130)/(59-81) 102/73 (11/28 0700) FiO2 (%):  [40 %] 40 % (11/28 0400) Weight:  [95.3 kg] 95.3 kg (11/28 0400)   Current Facility-Administered Medications:    0.9 %  sodium chloride infusion, , Intravenous, PRN, Bensimhon, Shaune Pascal, MD, Stopped at 10/23/22 1849   0.9 %  sodium chloride infusion, , Intravenous, Continuous, Bensimhon, Shaune Pascal, MD, Last Rate: 10 mL/hr at 10/24/22 0211, 10 mL/hr at 10/24/22 0211   0.9 %  sodium chloride infusion, , Intravenous, Continuous, Bensimhon, Shaune Pascal, MD, Last Rate: 10 mL/hr at 10/25/22 0700, Infusion Verify at 10/25/22 0700   0.9 %  sodium chloride infusion, 250 mL, Intravenous, PRN, Bensimhon, Shaune Pascal, MD   acetaminophen (TYLENOL) tablet 650 mg, 650 mg, Oral, Q6H PRN **OR** acetaminophen (TYLENOL) suppository 650 mg, 650 mg, Rectal, Q6H PRN, Bensimhon, Shaune Pascal, MD   [COMPLETED] amiodarone (NEXTERONE) 1.8 mg/mL load via infusion 150 mg, 150 mg, Intravenous, Once, 150 mg at 10/21/22 0922 **FOLLOWED BY** [EXPIRED] amiodarone (NEXTERONE PREMIX) 360-4.14 MG/200ML-%  (1.8 mg/mL) IV infusion, 60 mg/hr, Intravenous, Continuous, Stopped at 10/21/22 1437 **FOLLOWED BY** amiodarone (NEXTERONE PREMIX) 360-4.14 MG/200ML-% (1.8 mg/mL) IV infusion, 30 mg/hr, Intravenous, Continuous, Bensimhon, Shaune Pascal, MD, Last Rate: 16.67 mL/hr at 10/25/22 0700, 30 mg/hr at 10/25/22 0700   artificial tears (LACRILUBE) ophthalmic ointment, , Both Eyes, Q4H PRN, Jacky Kindle, MD, Given at 10/22/22 0443   Chlorhexidine Gluconate Cloth 2 % PADS 6 each, 6 each, Topical, Daily, Chand, Sudham, MD, 6 each at 10/25/22 0400   clonazePAM (KLONOPIN) disintegrating tablet 1 mg, 1 mg, Per Tube, TID, Einar Grad, RPH, 1 mg at 10/24/22 2136   docusate (COLACE) 50 MG/5ML liquid 100 mg, 100 mg, Per Tube, BID, Bensimhon, Shaune Pascal, MD, 100 mg at 10/24/22 2136   EPINEPHrine (ADRENALIN) 5 mg in NS 250 mL (0.02 mg/mL) premix infusion, 0.5-20 mcg/min, Intravenous, Titrated, Bensimhon, Shaune Pascal, MD, Stopped at 10/22/22 0245   feeding supplement (PROSource TF20) liquid 60 mL, 60 mL, Per Tube, QID, Julian Hy, DO, 60 mL at 10/24/22 2136   feeding supplement (VITAL 1.5 CAL) liquid 1,000 mL, 1,000 mL, Per Tube, Continuous, Julian Hy, DO, Last Rate: 20 mL/hr at 10/25/22 0700, Infusion Verify at 10/25/22 0700   fentaNYL (SUBLIMAZE) injection 50-200 mcg, 50-200 mcg, Intravenous, Q30 min PRN, Bensimhon, Shaune Pascal, MD, 100 mcg at 10/21/22 0430   heparin ADULT infusion 100 units/mL (25000 units/233m), 1,400 Units/hr, Intravenous, Continuous, WUrsula Beath RPH, Last Rate: 14 mL/hr at 10/25/22 0700, 1,400  Units/hr at 10/25/22 0700   hydrocortisone sodium succinate (SOLU-CORTEF) 100 MG injection 100 mg, 100 mg, Intravenous, Q12H, Chand, Sudham, MD, 100 mg at 10/25/22 0500   influenza vac split quadrivalent PF (FLUARIX) injection 0.5 mL, 0.5 mL, Intramuscular, Prior to discharge, Chesley Mires, MD   insulin aspart (novoLOG) injection 0-15 Units, 0-15 Units, Subcutaneous, Q4H, Bensimhon, Shaune Pascal, MD, 3 Units at  10/25/22 0758   insulin glargine-yfgn (SEMGLEE) injection 20 Units, 20 Units, Subcutaneous, Daily, Chand, Currie Paris, MD, 20 Units at 10/24/22 1001   levETIRAcetam (KEPPRA) IVPB 1000 mg/100 mL premix, 1,000 mg, Intravenous, Q12H, Jacky Kindle, MD, Stopped at 10/25/22 0052   LORazepam (ATIVAN) injection 2 mg, 2 mg, Intravenous, Q1H PRN, Jacky Kindle, MD, 2 mg at 10/23/22 1513   midazolam (VERSED) injection 1-2 mg, 1-2 mg, Intravenous, Q1H PRN, Bensimhon, Shaune Pascal, MD, 2 mg at 10/21/22 0429   milrinone (PRIMACOR) 20 MG/100 ML (0.2 mg/mL) infusion, 0.25 mcg/kg/min, Intravenous, Continuous, Bensimhon, Shaune Pascal, MD, Last Rate: 7.42 mL/hr at 10/25/22 0700, 0.25 mcg/kg/min at 10/25/22 0700   norepinephrine (LEVOPHED) 16 mg in 238m (0.064 mg/mL) premix infusion, 0-40 mcg/min, Intravenous, Titrated, Bensimhon, DShaune Pascal MD, Last Rate: 4.69 mL/hr at 10/25/22 0700, 5 mcg/min at 10/25/22 0700   ondansetron (ZOFRAN) injection 4 mg, 4 mg, Intravenous, Q6H PRN, Bensimhon, DShaune Pascal MD   ondansetron (ZOFRAN) tablet 4 mg, 4 mg, Oral, Q6H PRN **OR** [DISCONTINUED] ondansetron (ZOFRAN) injection 4 mg, 4 mg, Intravenous, Q6H PRN, Bensimhon, DShaune Pascal MD   Oral care mouth rinse, 15 mL, Mouth Rinse, Q2H, Bensimhon, DShaune Pascal MD, 15 mL at 10/25/22 0758   Oral care mouth rinse, 15 mL, Mouth Rinse, PRN, Bensimhon, DShaune Pascal MD   pantoprazole (PROTONIX) injection 40 mg, 40 mg, Intravenous, Daily, Bensimhon, DShaune Pascal MD, 40 mg at 10/24/22 0901   piperacillin-tazobactam (ZOSYN) IVPB 2.25 g, 2.25 g, Intravenous, Q8H, Chand, SCurrie Paris MD, Stopped at 10/25/22 0141   pneumococcal 20-valent conjugate vaccine (PREVNAR 20) injection 0.5 mL, 0.5 mL, Intramuscular, Prior to discharge, Bensimhon, DShaune Pascal MD   polyethylene glycol (MIRALAX / GLYCOLAX) packet 17 g, 17 g, Per Tube, Daily, Bensimhon, DShaune Pascal MD, 17 g at 10/24/22 0901   potassium chloride 10 mEq in 50 mL *CENTRAL LINE* IVPB, 10 mEq, Intravenous, Q1 Hr x 4, [place], Emily T,  MD, Last Rate: 50 mL/hr at 10/25/22 0800, 10 mEq at 10/25/22 0800   propofol (DIPRIVAN) 1000 MG/100ML infusion, 5-80 mcg/kg/min, Intravenous, Titrated, Chand, Sudham, MD, Last Rate: 5.39 mL/hr at 10/25/22 0700, 10 mcg/kg/min at 10/25/22 0700   senna-docusate (Senokot-S) tablet 1 tablet, 1 tablet, Oral, QHS PRN, Bensimhon, DShaune Pascal MD   sodium bicarbonate injection 100 mEq, 100 mEq, Intravenous, Once, Bensimhon, DShaune Pascal MD   sodium chloride flush (NS) 0.9 % injection 3 mL, 3 mL, Intravenous, Q12H, Bensimhon, DShaune Pascal MD, 3 mL at 10/23/22 2000   sodium chloride flush (NS) 0.9 % injection 3 mL, 3 mL, Intravenous, Q12H, Bensimhon, DShaune Pascal MD, 3 mL at 10/24/22 0937   sodium chloride flush (NS) 0.9 % injection 3 mL, 3 mL, Intravenous, PRN, Bensimhon, DShaune Pascal MD   Physical Exam  Constitutional: Appears well-developed and well-nourished.  Psych: Minimally interactive Eyes: Scleral edema is present with some mild scleral icterus bilaterally HENT: ET tube in place Cardiovascular: Atrial fibrillation on monitor  Respiratory: Breathing comfortably on the ventilator, breathing at the ventilator set rate GI: Soft.  No distension.   Neuro: Mental Status: Does not open eyes spontaneously, to voice or  noxious stimulation Does not follow any commands Cranial Nerves: II: No blink to threat. Pupils are equal, round, and reactive to light.   III,IV, VI/VIII: EOMI absent to VOR V/VII: Facial sensation is symmetric to eyelash brush (triggers myoclonic blinking bilaterally) VIII: No response to voice X/XI: Intact cough/gag XII: Unable to assess tongue protrusion secondary to patient's mental status  Motor/Sensory: Tone is normal. Bulk is normal.  Slight myoclonic jerking to noxious stim in any of the extremities, no purposeful movement, no posturing  Pertinent Labs:  Basic Metabolic Panel: Recent Labs  Lab 10/21/22 2035 10/21/22 2316 10/22/22 0217 10/22/22 0220 10/23/22 0508 10/23/22 1631  10/23/22 1639 10/24/22 0422 10/24/22 0446 10/24/22 1756 10/25/22 0457  NA 144   < > 142   < > 142 142 139 138 140 143 143  K 3.3*   < > 3.3*   < > 3.6 3.4* 3.2* 3.3* 3.4* 3.7 3.1*  CL 99  --  93*   < > 93* 92*  --   --  88* 92* 95*  CO2 19*  --  23   < > 26 24  --   --  '25 26 23  '$ GLUCOSE 344*  --  338*   < > 153* 140*  --   --  130* 118* 187*  BUN 40*  --  46*   < > 71* 83*  --   --  96* 109* 126*  CREATININE 3.35*  --  3.88*   < > 5.43* 5.94*  --   --  6.29* 6.48* 6.54*  CALCIUM 6.9*  --  7.8*   < > 8.2* 7.8*  --   --  8.5* 8.3* 8.2*  MG 1.9  --  2.3  --   --  2.5*  --   --  2.5*  --  2.5*  PHOS  --   --  5.1*  --   --   --   --   --   --   --  9.0*   < > = values in this interval not displayed.    Lab Results  Component Value Date   ALT 4,555 (H) 10/24/2022   AST 2,247 (H) 10/24/2022   ALKPHOS 104 10/24/2022   BILITOT 2.2 (H) 10/24/2022   (Gradually improving)   CBC: Recent Labs  Lab 10/20/22 1715 10/20/22 1957 10/21/22 2335 10/22/22 0220 10/22/22 1619 10/22/22 1640 10/23/22 1631 10/23/22 1639 10/24/22 0422 10/24/22 0446 10/25/22 0457  WBC 10.6*   < > 23.9*  --  29.8*  --  28.7*  --   --  26.4* 24.9*  NEUTROABS 7.7  --   --   --   --   --   --   --   --   --   --   HGB 13.1   < > 11.3*   < > 11.4*   < > 11.3* 11.9* 11.6* 11.5* 11.9*  HCT 37.5*   < > 31.6*   < > 31.9*   < > 32.3* 35.0* 34.0* 32.8* 34.1*  MCV 85.2   < > 85.6  --  84.6  --  85.4  --   --  85.2 84.8  PLT 236   < > 175  --  138*  --  122*  --   --  118* 125*   < > = values in this interval not displayed.   Alkalotic PH 7.521  Coagulation Studies: No results for input(s): "LABPROT", "INR" in the last 72  hours.   Carboxyhemoglobin 2.8% (ref range 0 to 1.5%)  EEG read: "EEG showed near continuous generalized mixed frequencies with predominantly 8-'9hz'$  alpha activity admixed with intermittent generalized 2 to 3 Hz delta slowing as well as low amplitude 12 to 13 Hz beta activity, ABNORMALITY:  Continuous slow, generalized"  Overnight EEG remains negative for any definite seizures   Assessment: 66 year old with clinical history, neurological examination and EEG findings highly concerning for anoxic brain injury, pending MRI today to help aid prognostication and goals of care discussions  Impression:  - Acute hypoxic respiratory failure, aspiration pneumonia, pulmonary edema - S/p PEA arrest secondary to hypoxia, ROSC after 6 minutes of CPR  - Myoclonic seizure with concern for anoxic brain injury, stable - 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), worsening - A-fib with RVR, improving - Mixed shock, septic, cardiogenic - EF 20% with intra-arterial balloon pump in place (11/24) - Acute kidney injury, with worsening uremia - Hypokalemia and hypocalcemia, resolving - Type 2 diabetes with hyperglycemia, also being treated with stress dose steroids - Shock liver, improving - AML in remission s/p chemotherapy  Recommendations: -discontinue LTM EEG  -Continue Keppra 500 mg daily due to renal function -Continue Klonopin 1 mg 3 times daily to control movements  -Dicussed with CCM in person -Neurology will continue to follow    Lesleigh Noe MD-PhD [facility] [phone]   CRITICAL CARE Performed by: Lorenza Chick   Total critical care time: 30 minutes  Critical care time was exclusive of separately billable procedures and treating other patients.  Critical care was necessary to treat or prevent imminent or life-threatening deterioration.  Critical care was time spent personally by me on the following activities: development of treatment plan with patient and/or surrogate as well as nursing, discussions with consultants, evaluation of patient's response to treatment, examination of patient, obtaining history from patient or surrogate, ordering and performing treatments and interventions, ordering and review of laboratory studies, ordering  and review of radiographic studies, pulse oximetry and re-evaluation of patient's condition.

## 2022-10-25 NOTE — Progress Notes (Signed)
LTM EEG discontinued - no skin breakdown at unhook.   

## 2022-10-25 NOTE — Progress Notes (Signed)
EEG maintenance performed. No skin breakdown noted.  °

## 2022-10-26 ENCOUNTER — Inpatient Hospital Stay (HOSPITAL_COMMUNITY): Payer: Medicare HMO

## 2022-10-26 DIAGNOSIS — G253 Myoclonus: Secondary | ICD-10-CM | POA: Diagnosis not present

## 2022-10-26 DIAGNOSIS — J810 Acute pulmonary edema: Secondary | ICD-10-CM | POA: Diagnosis not present

## 2022-10-26 DIAGNOSIS — I4891 Unspecified atrial fibrillation: Secondary | ICD-10-CM | POA: Diagnosis not present

## 2022-10-26 DIAGNOSIS — I469 Cardiac arrest, cause unspecified: Secondary | ICD-10-CM

## 2022-10-26 DIAGNOSIS — J9601 Acute respiratory failure with hypoxia: Secondary | ICD-10-CM | POA: Diagnosis not present

## 2022-10-26 DIAGNOSIS — J9600 Acute respiratory failure, unspecified whether with hypoxia or hypercapnia: Secondary | ICD-10-CM | POA: Diagnosis not present

## 2022-10-26 DIAGNOSIS — R739 Hyperglycemia, unspecified: Secondary | ICD-10-CM

## 2022-10-26 DIAGNOSIS — R570 Cardiogenic shock: Secondary | ICD-10-CM | POA: Diagnosis not present

## 2022-10-26 DIAGNOSIS — Z9911 Dependence on respirator [ventilator] status: Secondary | ICD-10-CM | POA: Diagnosis not present

## 2022-10-26 LAB — GLUCOSE, CAPILLARY
Glucose-Capillary: 161 mg/dL — ABNORMAL HIGH (ref 70–99)
Glucose-Capillary: 149 mg/dL — ABNORMAL HIGH (ref 70–99)
Glucose-Capillary: 142 mg/dL — ABNORMAL HIGH (ref 70–99)
Glucose-Capillary: 151 mg/dL — ABNORMAL HIGH (ref 70–99)
Glucose-Capillary: 150 mg/dL — ABNORMAL HIGH (ref 70–99)
Glucose-Capillary: 151 mg/dL — ABNORMAL HIGH (ref 70–99)
Glucose-Capillary: 19 mg/dL — CL (ref 70–99)

## 2022-10-26 LAB — CULTURE, BLOOD (ROUTINE X 2)
Culture: NO GROWTH
Culture: NO GROWTH
Special Requests: ADEQUATE

## 2022-10-26 LAB — BASIC METABOLIC PANEL
Anion gap: 18 — ABNORMAL HIGH (ref 5–15)
Anion gap: 19 — ABNORMAL HIGH (ref 5–15)
BUN: 139 mg/dL — ABNORMAL HIGH (ref 8–23)
BUN: 139 mg/dL — ABNORMAL HIGH (ref 8–23)
CO2: 25 mmol/L (ref 22–32)
CO2: 26 mmol/L (ref 22–32)
Calcium: 8 mg/dL — ABNORMAL LOW (ref 8.9–10.3)
Calcium: 8.2 mg/dL — ABNORMAL LOW (ref 8.9–10.3)
Chloride: 97 mmol/L — ABNORMAL LOW (ref 98–111)
Chloride: 102 mmol/L (ref 98–111)
Creatinine, Ser: 6.29 mg/dL — ABNORMAL HIGH (ref 0.61–1.24)
Creatinine, Ser: 5.71 mg/dL — ABNORMAL HIGH (ref 0.61–1.24)
GFR, Estimated: 9 mL/min — ABNORMAL LOW (ref >60)
GFR, Estimated: 10 mL/min — ABNORMAL LOW (ref >60)
Glucose, Bld: 172 mg/dL — ABNORMAL HIGH (ref 70–99)
Glucose, Bld: 163 mg/dL — ABNORMAL HIGH (ref 70–99)
Potassium: 2.6 mmol/L — CL (ref 3.5–5.1)
Potassium: 2.7 mmol/L — CL (ref 3.5–5.1)
Sodium: 140 mmol/L (ref 135–145)
Sodium: 147 mmol/L — ABNORMAL HIGH (ref 135–145)

## 2022-10-26 LAB — APTT
aPTT: 55 seconds — ABNORMAL HIGH (ref 24–36)
aPTT: 80 seconds — ABNORMAL HIGH (ref 24–36)

## 2022-10-26 LAB — CBC
HCT: 34.2 % — ABNORMAL LOW (ref 39.0–52.0)
Hemoglobin: 12.1 g/dL — ABNORMAL LOW (ref 13.0–17.0)
MCH: 29.6 pg (ref 26.0–34.0)
MCHC: 35.4 g/dL (ref 30.0–36.0)
MCV: 83.6 fL (ref 80.0–100.0)
Platelets: 127 10*3/uL — ABNORMAL LOW (ref 150–400)
RBC: 4.09 MIL/uL — ABNORMAL LOW (ref 4.22–5.81)
RDW: 14.7 % (ref 11.5–15.5)
WBC: 22.6 10*3/uL — ABNORMAL HIGH (ref 4.0–10.5)
nRBC: 14.9 % — ABNORMAL HIGH (ref 0.0–0.2)

## 2022-10-26 LAB — PHOSPHORUS: Phosphorus: 8.1 mg/dL — ABNORMAL HIGH (ref 2.5–4.6)

## 2022-10-26 LAB — HEPATIC FUNCTION PANEL
ALT: 2524 U/L — ABNORMAL HIGH (ref 0–44)
AST: 366 U/L — ABNORMAL HIGH (ref 15–41)
Albumin: 2.6 g/dL — ABNORMAL LOW (ref 3.5–5.0)
Alkaline Phosphatase: 124 U/L (ref 38–126)
Bilirubin, Direct: 1 mg/dL — ABNORMAL HIGH (ref 0.0–0.2)
Indirect Bilirubin: 1.1 mg/dL — ABNORMAL HIGH (ref 0.3–0.9)
Total Bilirubin: 2.1 mg/dL — ABNORMAL HIGH (ref 0.3–1.2)
Total Protein: 6.4 g/dL — ABNORMAL LOW (ref 6.5–8.1)

## 2022-10-26 LAB — COOXEMETRY PANEL
Carboxyhemoglobin: 2 % — ABNORMAL HIGH (ref 0.5–1.5)
Methemoglobin: 0.7 % (ref 0.0–1.5)
O2 Saturation: 84.9 %
Total hemoglobin: 12.7 g/dL (ref 12.0–16.0)

## 2022-10-26 LAB — MAGNESIUM: Magnesium: 2.5 mg/dL — ABNORMAL HIGH (ref 1.7–2.4)

## 2022-10-26 LAB — HEPARIN LEVEL (UNFRACTIONATED)
Heparin Unfractionated: 0.31 IU/mL (ref 0.30–0.70)
Heparin Unfractionated: 0.41 IU/mL (ref 0.30–0.70)

## 2022-10-26 MED ORDER — HYDROCORTISONE SOD SUC (PF) 100 MG IJ SOLR
50.0000 mg | Freq: Every day | INTRAMUSCULAR | Status: DC
  Administered 2022-10-28: 50 mg via INTRAVENOUS
  Filled 2022-10-26: qty 1

## 2022-10-26 MED ORDER — POTASSIUM CHLORIDE 10 MEQ/50ML IV SOLN
10.0000 meq | INTRAVENOUS | Status: AC
  Administered 2022-10-26 (×4): 10 meq via INTRAVENOUS
  Filled 2022-10-26 (×4): qty 50

## 2022-10-26 MED ORDER — CLONAZEPAM 0.25 MG PO TBDP
2.0000 mg | ORAL_TABLET | Freq: Three times a day (TID) | ORAL | Status: DC
  Administered 2022-10-26 (×2): 2 mg
  Filled 2022-10-26: qty 3
  Filled 2022-10-26: qty 8
  Filled 2022-10-26: qty 5

## 2022-10-26 MED ORDER — LEVETIRACETAM IN NACL 500 MG/100ML IV SOLN
500.0000 mg | Freq: Two times a day (BID) | INTRAVENOUS | Status: DC
  Administered 2022-10-26 – 2022-10-28 (×5): 500 mg via INTRAVENOUS
  Filled 2022-10-26 (×5): qty 100

## 2022-10-26 MED ORDER — FUROSEMIDE 10 MG/ML IJ SOLN
120.0000 mg | Freq: Once | INTRAVENOUS | Status: AC
  Administered 2022-10-26: 120 mg via INTRAVENOUS
  Filled 2022-10-26: qty 10

## 2022-10-26 MED ORDER — POTASSIUM CHLORIDE 20 MEQ PO PACK
60.0000 meq | PACK | Freq: Once | ORAL | Status: AC
  Administered 2022-10-26: 60 meq
  Filled 2022-10-26: qty 3

## 2022-10-26 MED ORDER — POTASSIUM CHLORIDE 20 MEQ PO PACK
40.0000 meq | PACK | Freq: Once | ORAL | Status: AC
  Administered 2022-10-26: 40 meq
  Filled 2022-10-26: qty 2

## 2022-10-26 MED ORDER — VITAL 1.5 CAL PO LIQD
1000.0000 mL | ORAL | Status: DC
  Administered 2022-10-26: 1000 mL

## 2022-10-26 MED ORDER — HYDROCORTISONE SOD SUC (PF) 100 MG IJ SOLR
100.0000 mg | Freq: Every day | INTRAMUSCULAR | Status: AC
  Administered 2022-10-27: 100 mg via INTRAVENOUS
  Filled 2022-10-26: qty 2

## 2022-10-26 MED ORDER — INSULIN GLARGINE-YFGN 100 UNIT/ML ~~LOC~~ SOLN
16.0000 [IU] | Freq: Every day | SUBCUTANEOUS | Status: DC
  Administered 2022-10-27 – 2022-10-28 (×2): 16 [IU] via SUBCUTANEOUS
  Filled 2022-10-26 (×2): qty 0.16

## 2022-10-26 NOTE — Progress Notes (Signed)
Advanced Heart Failure Rounding Note   Subjective:    Swan and IABP removed yesterday to permit brain MRI  MRI not done overnight due to push back from Radiology techs.   Remains sedated on vent.   On NE 6 milrinone 0.25 Remains in AF. Co-ox 85% CVP 9  SCr starting to improve. LFTs coming down. K 2.6    Objective:   Weight Range:  Vital Signs:   Temp:  [91.8 F (33.2 C)-98 F (36.7 C)] 97.7 F (36.5 C) (11/29 0730) Pulse Rate:  [70-230] 82 (11/29 0700) Resp:  [0-30] 14 (11/29 0800) BP: (79-151)/(52-86) 87/64 (11/29 0700) SpO2:  [74 %-100 %] 99 % (11/29 0700) Arterial Line BP: (90-164)/(43-91) 120/62 (11/29 0630) FiO2 (%):  [40 %] 40 % (11/29 0445) Weight:  [94.9 kg] 94.9 kg (11/29 0432) Last BM Date : 10/25/22  Weight change: Filed Weights   10/24/22 0431 10/25/22 0400 10/26/22 0432  Weight: 98.9 kg 95.3 kg 94.9 kg    Intake/Output:   Intake/Output Summary (Last 24 hours) at 10/26/2022 0818 Last data filed at 10/26/2022 0800 Gross per 24 hour  Intake 2391.22 ml  Output 3141 ml  Net -749.78 ml     PHYSICAL EXAM: General:  Intubated sedated HEENT: normal + ETT Neck: supple. RIJ introducer Cor: PMI nondisplaced. Irregular rate & rhythm. No rubs, gallops or murmurs. Lungs: clear Abdomen: soft, nontender, nondistended. No hepatosplenomegaly. No bruits or masses. Good bowel sounds. Extremities: no cyanosis, clubbing, rash, 1+ edema Neuro: intubated sedated     Telemetry: AF 80s Personally reviewed    Labs: Basic Metabolic Panel: Recent Labs  Lab 10/22/22 0217 10/22/22 0220 10/23/22 1631 10/23/22 1639 10/24/22 0422 10/24/22 0446 10/24/22 1756 10/25/22 0457 10/26/22 0341  NA 142   < > 142   < > 138 140 143 143 140  K 3.3*   < > 3.4*   < > 3.3* 3.4* 3.7 3.1* 2.6*  CL 93*   < > 92*  --   --  88* 92* 95* 97*  CO2 23   < > 24  --   --  '25 26 23 25  '$ GLUCOSE 338*   < > 140*  --   --  130* 118* 187* 172*  BUN 46*   < > 83*  --   --  96* 109*  126* 139*  CREATININE 3.88*   < > 5.94*  --   --  6.29* 6.48* 6.54* 6.29*  CALCIUM 7.8*   < > 7.8*  --   --  8.5* 8.3* 8.2* 8.0*  MG 2.3  --  2.5*  --   --  2.5*  --  2.5* 2.5*  PHOS 5.1*  --   --   --   --   --   --  9.0* 8.1*   < > = values in this interval not displayed.     Liver Function Tests: Recent Labs  Lab 10/20/22 1715 10/22/22 0217 10/23/22 1631 10/24/22 0446 10/26/22 0341  AST 53* >10,000* 3,777* 2,247* 366*  ALT 59* 5,030* 5,167* 4,555* 2,524*  ALKPHOS 32* 94 119 104 124  BILITOT 1.6* 2.6* 2.2* 2.2* 2.1*  PROT 7.1 5.8* 6.0* 5.9* 6.4*  ALBUMIN 3.2* 2.6* 2.7* 2.6* 2.6*    No results for input(s): "LIPASE", "AMYLASE" in the last 168 hours. No results for input(s): "AMMONIA" in the last 168 hours.  CBC: Recent Labs  Lab 10/20/22 1715 10/20/22 1957 10/22/22 1619 10/22/22 1640 10/23/22 1631 10/23/22 1639 10/24/22  0422 10/24/22 0446 10/25/22 0457 10/26/22 0341  WBC 10.6*   < > 29.8*  --  28.7*  --   --  26.4* 24.9* 22.6*  NEUTROABS 7.7  --   --   --   --   --   --   --   --   --   HGB 13.1   < > 11.4*   < > 11.3* 11.9* 11.6* 11.5* 11.9* 12.1*  HCT 37.5*   < > 31.9*   < > 32.3* 35.0* 34.0* 32.8* 34.1* 34.2*  MCV 85.2   < > 84.6  --  85.4  --   --  85.2 84.8 83.6  PLT 236   < > 138*  --  122*  --   --  118* 125* 127*   < > = values in this interval not displayed.     Cardiac Enzymes: No results for input(s): "CKTOTAL", "CKMB", "CKMBINDEX", "TROPONINI" in the last 168 hours.  BNP: BNP (last 3 results) Recent Labs    10/20/22 1715  BNP 1,300.4*     ProBNP (last 3 results) No results for input(s): "PROBNP" in the last 8760 hours.    Other results:  Imaging: DG CHEST PORT 1 VIEW  Result Date: 10/25/2022 CLINICAL DATA:  Intra-aortic balloon pump.  Ventilator support. EXAM: PORTABLE CHEST 1 VIEW COMPARISON:  10/24/2022 FINDINGS: Endotracheal tube tip 3 cm above the carina. Orogastric or nasogastric to enters the stomach. Swan-Ganz catheter tip  in the right middle or lower lobe pulmonary artery. Pacemaker defibrillator remains in place. Aortic balloon pump marker is slightly more distal, now within the upper descending aorta just below the level of the arch. Left lower lobe infiltrate/atelectasis persists, probably with some left pleural fluid. IMPRESSION: Aortic balloon pump marker is slightly more distal, now within the upper descending aorta just below the level of the arch. Persistent left lower lobe atelectasis and or pneumonia with a small amount pleural fluid. Electronically Signed   By: Nelson Chimes M.D.   On: 10/25/2022 07:25   DG CHEST PORT 1 VIEW  Result Date: 10/24/2022 CLINICAL DATA:  ETT placement EXAM: PORTABLE CHEST 1 VIEW COMPARISON:  10/22/2022 FINDINGS: Endotracheal tube with the tip 4 cm above the carina. Swan-Ganz catheter with the tip projecting over the right main pulmonary artery. Nasogastric tube coursing below the diaphragm. Left lower lobe airspace disease. Trace bilateral pleural effusions. No pneumothorax. Stable cardiomegaly. No acute osseous abnormality. IMPRESSION: 1. Support lines and tubing in satisfactory position. 2. Left lower lobe airspace disease which may reflect atelectasis versus pneumonia. Electronically Signed   By: Kathreen Devoid M.D.   On: 10/24/2022 11:04     Medications:     Scheduled Medications:  Chlorhexidine Gluconate Cloth  6 each Topical Daily   clonazepam  1 mg Per Tube TID   docusate  100 mg Per Tube BID   feeding supplement (PROSource TF20)  60 mL Per Tube QID   hydrocortisone sod succinate (SOLU-CORTEF) inj  100 mg Intravenous Q12H   insulin aspart  0-15 Units Subcutaneous Q4H   insulin glargine-yfgn  20 Units Subcutaneous Daily   mouth rinse  15 mL Mouth Rinse Q2H   pantoprazole (PROTONIX) IV  40 mg Intravenous Daily   polyethylene glycol  17 g Per Tube Daily   potassium chloride  40 mEq Per Tube Once    Infusions:  sodium chloride Stopped (10/23/22 1849)   sodium chloride  Stopped (10/25/22 2239)   sodium chloride  amiodarone 30 mg/hr (10/26/22 0600)   epinephrine Stopped (10/22/22 0245)   feeding supplement (VITAL 1.5 CAL) Stopped (10/26/22 0749)   heparin 1,400 Units/hr (10/26/22 0600)   levETIRAcetam 500 mg (10/26/22 0745)   milrinone 0.25 mcg/kg/min (10/26/22 0600)   norepinephrine (LEVOPHED) Adult infusion 6 mcg/min (10/26/22 0600)   piperacillin-tazobactam (ZOSYN)  IV Stopped (10/26/22 0154)   potassium chloride 10 mEq (10/26/22 0802)   propofol (DIPRIVAN) infusion Stopped (10/26/22 0730)    PRN Medications: sodium chloride, Place/Maintain arterial line **AND** sodium chloride, acetaminophen **OR** acetaminophen, artificial tears, fentaNYL (SUBLIMAZE) injection, influenza vac split quadrivalent PF, LORazepam, midazolam, ondansetron (ZOFRAN) IV, ondansetron **OR** [DISCONTINUED] ondansetron (ZOFRAN) IV, mouth rinse, pneumococcal 20-valent conjugate vaccine, senna-docusate   Assessment/Plan:   1. 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 EF 35% in 2020. S/p Biotronik ICD - EF 35% -> 20% in setting of AF - Echo 10/21/22 EF 20% RV moderately reduced - S/p PEA arrest 11/23 - IABP out 11/28 - Co-ox 85% On NE 6 and milrinone 0.25.  - Wean inotropes as tolerated - CVP 9 Start diuresis - Plan brain MRI for prognostication   2. Anoxic brain injury - having seizures/myoclonus -> propofol/Keppra - Brain CT 10/21/22 - normal - Repeat CT 11/26, normal.   - now w/ temp dysregulation  - D/w neurology. Suspect poor prognosis - Will plan MRI today  3. PEA arrest - 10/20/22 - CPR x 6 mins  4. AF, persistent - likely cause of tachy CM - was in NSR on 11/24 now in AF but rate improved - Continue heparin/IV amio  5. Non oliguric AKI - due to ATN/shock - SCr 0.8 -> 3.88 -> 5.43->6.29 ->6.54 -> 6.29 - Continue hemodynamic support - Renal function improving now. Will await brain MRI. If not  definitive for severe anoxic injury. Will need to consider CRRT or wait until BUN clears to help assess neuro function  6. Acute hypoxic respiratory failure - due to HF/cardiac arrest - remains intubated  7. Shock liver - improving continue supportive care  8. Hypocalcemia/hypokalemia - supp PRN   9. H/o AML  - s/p chemo 2019  CRITICAL CARE Performed by: Glori Bickers  Total critical care time: 45 minutes  Critical care time was exclusive of separately billable procedures and treating other patients.  Critical care was necessary to treat or prevent imminent or life-threatening deterioration.  Critical care was time spent personally by me (independent of midlevel providers or residents) on the following activities: development of treatment plan with patient and/or surrogate as well as nursing, discussions with consultants, evaluation of patient's response to treatment, examination of patient, obtaining history from patient or surrogate, ordering and performing treatments and interventions, ordering and review of laboratory studies, ordering and review of radiographic studies, pulse oximetry and re-evaluation of patient's condition.  Length of Stay: 6   Glori Bickers MD 10/26/2022, 8:18 AM  Advanced Heart Failure Team Pager [phone] (M-F; [place])  Please contact [facility] for night-coverage after hours (4p -7a ) and weekends on [email or web address]

## 2022-10-26 NOTE — Progress Notes (Addendum)
Subjective: Continues to have rhythmic jerking of eyes in all 4 extremities with stimulation.  ROS: Unable to obtain due to poor mental status  Examination  Vital signs in last 24 hours: Temp:  [91.8 F (33.2 C)-98 F (36.7 C)] 97.7 F (36.5 C) (11/29 0730) Pulse Rate:  [70-224] 84 (11/29 0918) Resp:  [0-30] 17 (11/29 1200) BP: (79-151)/(52-89) 134/89 (11/29 1200) SpO2:  [74 %-100 %] 100 % (11/29 0856) Arterial Line BP: (96-164)/(43-91) 144/75 (11/29 1200) FiO2 (%):  [40 %-100 %] 100 % (11/29 0856) Weight:  [94.9 kg] 94.9 kg (11/29 0432)  General: lying in bed, intubated Neuro: Turned off propofol, appears to be wincing but did not open eyes to noxious stimulation, PERRLA, corneal reflex intact, subtle cough reflex, did not withdraw but did have rhythmic jerking of all 4 extremities with noxious stimulation  Basic Metabolic Panel: Recent Labs  Lab 10/22/22 0217 10/22/22 0220 10/23/22 1631 10/23/22 1639 10/24/22 0422 10/24/22 0446 10/24/22 1756 10/25/22 0457 10/26/22 0341  NA 142   < > 142   < > 138 140 143 143 140  K 3.3*   < > 3.4*   < > 3.3* 3.4* 3.7 3.1* 2.6*  CL 93*   < > 92*  --   --  88* 92* 95* 97*  CO2 23   < > 24  --   --  '25 26 23 25  '$ GLUCOSE 338*   < > 140*  --   --  130* 118* 187* 172*  BUN 46*   < > 83*  --   --  96* 109* 126* 139*  CREATININE 3.88*   < > 5.94*  --   --  6.29* 6.48* 6.54* 6.29*  CALCIUM 7.8*   < > 7.8*  --   --  8.5* 8.3* 8.2* 8.0*  MG 2.3  --  2.5*  --   --  2.5*  --  2.5* 2.5*  PHOS 5.1*  --   --   --   --   --   --  9.0* 8.1*   < > = values in this interval not displayed.    CBC: Recent Labs  Lab 10/20/22 1715 10/20/22 1957 10/22/22 1619 10/22/22 1640 10/23/22 1631 10/23/22 1639 10/24/22 0422 10/24/22 0446 10/25/22 0457 10/26/22 0341  WBC 10.6*   < > 29.8*  --  28.7*  --   --  26.4* 24.9* 22.6*  NEUTROABS 7.7  --   --   --   --   --   --   --   --   --   HGB 13.1   < > 11.4*   < > 11.3* 11.9* 11.6* 11.5* 11.9* 12.1*  HCT  37.5*   < > 31.9*   < > 32.3* 35.0* 34.0* 32.8* 34.1* 34.2*  MCV 85.2   < > 84.6  --  85.4  --   --  85.2 84.8 83.6  PLT 236   < > 138*  --  122*  --   --  118* 125* 127*   < > = values in this interval not displayed.     Coagulation Studies: No results for input(s): "LABPROT", "INR" in the last 72 hours.  Imaging MRI brain without contrast 10/26/2022: Punctate acute infarction in the inferior posterior cerebellum on the right. No other acute infarction. No finding typical of hypoxic ischemic brain injury.  Old small right frontoparietal cortical infarction. Chronic small-vessel ischemic changes of the cerebral hemispheric deep white matter. Bilateral  mastoid effusions as often seen in the setting of prolonged mechanical ventilation.    ASSESSMENT AND PLAN: 66 year old male with with history of A-fib on Eliquis, leukemia, status post cardiac arrest on 10/21/2022, ROSC achieved after about 6 minutes.  LTM EEG is predominantly showed continuous generalized slowing suggestive of severe encephalopathy.  On 10/23/2022, patient was noted to have coughing and jerking of bilateral upper extremities after suctioning during which EEG showed generalized polyspikes consistent with myoclonic seizure.  However since then patient has had jerking of eyes and all extremities without definite EEG change.  MRI brain did not show any anoxic injury.   Cardiac arrest Suspected hypoxic brain injury Acute ischemic stroke, right cerebellum (incidental) -Patient continues to myoclonic jerking with stimulation. -Acute ischemic stroke was likely due to chest compressions/ cardioembolic.No tPA as outside window, no thrombectomy as outside window.  Recommendations -Will increase clonazepam to 2 mg 3 times daily as patient has activation induced myoclonic jerking -Continue Keppra 500 mg twice daily (renally dosed) -Unable to add Depakote as patient has had significant hepatic injury -MRI brain did not show any  anoxic/hypoxic brain injury.  However, discussed with family that at times, they can be hypoxic injury which does not show up on MRI brain.  Therefore, prognosis will be based on MRI, EEG and clinical exam.  -Discussed with son that we will stop propofol so we can get a more accurate examination off sedation. -If by with the weekend, patient continues to be comatose and does not have any significant improvement, then the likelihood of meaningful neurologic recovery will be minimal. -Of note, son also states that patient would not want long-term trach/PEG -Continue seizure precautions  I have spent a total of 52  minutes with the patient reviewing hospital notes,  test results, labs and examining the patient as well as establishing an assessment and plan that was discussed personally with the patient's son on phone and wife at bedside.  > 50% of time was spent in direct patient care.   Zeb Comfort Ep[facility] For questions after 5pm please refer to AMION to reach the Neurologist on call

## 2022-10-26 NOTE — Procedures (Signed)
Cortrak  Person Inserting Tube:  Esaw Dace, RD Tube Type:  Cortrak - 43 inches Tube Size:  10 Tube Location:  Left nare Secured by: Bridle Technique Used to Measure Tube Placement:  Marking at nare/corner of mouth Cortrak Secured At:  81 cm Procedure Comments:  Cortrak Tube Team Note:  Consult received to place a Cortrak feeding tube.   X-ray is required, abdominal x-ray has been ordered by the Cortrak team. Please confirm tube placement before using the Cortrak tube.   If the tube becomes dislodged please keep the tube and contact the Cortrak team at [email or web address] for replacement.  If after hours and replacement cannot be delayed, place a NG tube and confirm placement with an abdominal x-ray.   Kerman Passey MS, RDN, LDN, CNSC Registered Dietitian 3 Clinical Nutrition RD Pager and On-Call Pager Number Located in [place]

## 2022-10-26 NOTE — Progress Notes (Signed)
[facility] Progress Note Patient Name: Brendan Ellis DOB: Aug 20, 1956 MRN: [id number]   Date of Service  10/26/2022  HPI/Events of Note  Hypokalemia - K+ = 2.7 and Creatinine = 5.71.   eICU Interventions  Plan: Replace K+. Continue to trend K+.      Intervention Category Major Interventions: Electrolyte abnormality - evaluation and management  Lysle Dingwall 10/26/2022, 7:47 PM

## 2022-10-26 NOTE — Progress Notes (Addendum)
Nutrition Follow-up  DOCUMENTATION CODES:  Not applicable  INTERVENTION:  Once cortrak tube is placed and verified, begin advancement of Vital 1.5 Advance Vital 1.5 by 77m q 8hrs to goal rate of 636mhr x 24hrs (144044motal volume) with 36m79mosource TF20 BID to provide 2320kcal, 137g protein and 1094mL30me water Monitor lytes daily and replete prn  NUTRITION DIAGNOSIS:  Inadequate oral intake related to acute illness as evidenced by NPO status. -cortrak placed today  GOAL:  Patient will meet greater than or equal to 90% of their needs  MONITOR:  Vent status, TF tolerance, Labs, Weight trends  REASON FOR ASSESSMENT:  Consult, Ventilator Enteral/tube feeding initiation and management  ASSESSMENT:  66 yo60ale  admitted post PEA arrest in setting of hypoxia, acute on chronic heart failure with cardiogenic shock.  Pt with seizures, confirmed by EEG, with probable anoxic brain injury.  PMH includes hx of AML s/p chemo 2019, non ischemic dilated CM with HFrEF, HTN.  Levophed now off and maintaining MAP>65. Propofol off this AM. Pt remains on trickle feeds. Spoke with MD, plan to begin EN advancement today. OGT being transitioned to cortrak. Labs reviewed, LFTs remain elevated but improved. Steroids being titrated and BG managed with insulin. K low on morning labs, pt received repletion.  Labs reviewed: Na:140, K:2.6, BG:142-172, BUN:139, Cr:6.29, Phos:8.1, Mg:2.5, AST:366 (from 2247), ALT:2524(from [phone] bili:1.0, GFR:9  Medications reviewed and include: colace (held), solu-cortef, novolog ssi q 4hrs, semglee q day, protonix, miralax (held), KCl, milrinone, amiodarone   Diet Order:   Diet Order             Diet NPO time specified  Diet effective now                   EDUCATION NEEDS:  Not appropriate for education at this time  Skin:  Skin Assessment: Reviewed RN Assessment  Last BM:  11/29  Height:  Ht Readings from Last 1 Encounters:  10/20/22 '6\' 2"'$   (1.88 m)   Weight:  Wt Readings from Last 1 Encounters:  10/26/22 94.9 kg    BMI:  Body mass index is 26.86 kg/m.  Estimated Nutritional Needs:  Kcal:  2200-2400 kcals Protein:  130-150 g Fluid:  1.8 L   KatieCandise Bowens RD, LDN, CNSC See AMiON for contact information

## 2022-10-26 NOTE — Progress Notes (Signed)
Pt transported from MRI to [facility] with no complications.

## 2022-10-26 NOTE — Progress Notes (Signed)
Patient transported on vent to MRI without complications and placed on MRI vent.

## 2022-10-26 NOTE — Progress Notes (Addendum)
ANTICOAGULATION CONSULT NOTE  Pharmacy Consult for heparin Indication:  atrial flutter  No Known Allergies  Patient Measurements: Height: '6\' 2"'$  (188 cm) Weight: 94.9 kg (209 lb 3.5 oz) IBW/kg (Calculated) : 82.2 Heparin Dosing Weight: 88 kg  Vital Signs: Temp: 97.7 F (36.5 C) (11/29 0730) Temp Source: Oral (11/29 0730) BP: 119/80 (11/29 0929) Pulse Rate: 84 (11/29 0918)  Labs: Recent Labs    10/24/22 0446 10/24/22 1756 10/25/22 0457 10/26/22 0341  HGB 11.5*  --  11.9* 12.1*  HCT 32.8*  --  34.1* 34.2*  PLT 118*  --  125* 127*  APTT 79*  --  76* 55*  HEPARINUNFRC 0.78*  --  0.61 0.31  CREATININE 6.29* 6.48* 6.54* 6.29*     Estimated Creatinine Clearance: 13.8 mL/min (A) (by C-G formula based on SCr of 6.29 mg/dL (H)).   Medical History: Past Medical History:  Diagnosis Date   Hypertension    Leukemia ([place])    PAF (paroxysmal atrial fibrillation) ([place]) 04/27/2018    Medications:  Eliquis 5 mg BID PTA   Assessment: Mr. Cafarelli presented with increasing dyspnea on exertion for the past 2 weeks. Was found to be in atrial flutter with RVR on initial EKG. Due to worsening respiratory status, he was intubated overnight and started on vasopressor support. He went into PEA arrest and required 6 minutes of CPR and epi prior to ROSC. Given cardiogenic shock, he was taken to the cath lab this morning and an IABP was placed for further support. He has been taking Eliquis PTA for atrial flutter and last dose was 11/23 at 0900. Pharmacy has been consulted for heparin dosing.   IABP removed 11/28 and heparin was restarted at 2150. aPTT subtherapeutic at 55 this morning, on 1400 units/hr. Heparin level of 0.31 close to correlating with heparin level. Hgb 12.1, PLTs 127 stable. Per RN, no issues with heparin infusion, and bleeding from groin site on 11/28 has now resolved.   Goal of Therapy:  Heparin level 0.3-0.7 units/ml aPTT 66-102 seconds Monitor platelets by anticoagulation  protocol: Yes   Plan:  Increase heparin infusion slightly to 1450 units/hr  Check 8 hour aPTT/heparin level  Monitor heparin level, aPTT, CBC, renal function, and s/sx of bleeding daily  Eliseo Gum, PharmD PGY1 Pharmacy Resident   10/26/2022  10:37 AM

## 2022-10-26 NOTE — Progress Notes (Signed)
NAME:  Brendan Ellis, MRN:  [id number], DOB:  [birth year], LOS: 6 ADMISSION DATE:  10/20/2022, CONSULTATION DATE:  10/21/2022 REFERRING MD:  Dr. Posey Pronto, [facility], CHIEF COMPLAINT:  Short of breath   History of Present Illness:  66 yo male former smoker developed cough, sore throat and dyspnea about 1 week prior to admission.  Started on omnicef by his PCP, but symptoms progressed.  He presented to [facility] on 10/20/22.  He is followed by cardiology in [facility] for chemo induced CM and a fib.  He had A fib with RVR in the ER and started on amiodarone.  CT angio chest 2.2 x 1.8 cm pretracheal LN, small chronic loculated effusions, consolidative ASD in lower lobes, and interlobular septal thickening.  Started on IV ABx, diuretics and supplemental oxygen.  He was tried on Bipap but wasn't able to tolerate this.  PCCM consulted to arrange for management in ICU.  Pertinent  Medical History  Non ischemic dilated CM with HFrEF, s/p AICD, PAF on eliquis, Leukemia 2019, HTN  Significant Hospital Events: Including procedures, antibiotic start and stop dates in addition to other pertinent events   11/23 Admit 11/24 Transfer to ICU 11/26 seizure on EEG with sedation paused  Interim History / Subjective:  Off sedation this morning had myoclonus with stimulation, but is not waking up.   Objective   Blood pressure 119/80, pulse 84, temperature 97.7 F (36.5 C), temperature source Oral, resp. rate 14, height '6\' 2"'$  (1.88 m), weight 94.9 kg, SpO2 100 %. PAP: (17-35)/(8-21) 24/16 CVP:  [5 mmHg-9 mmHg] 9 mmHg CO:  [6.2 L/min] 6.2 L/min CI:  [2.9 L/min/m2] 2.9 L/min/m2  Vent Mode: PRVC FiO2 (%):  [40 %-100 %] 100 % Set Rate:  [14 bmp] 14 bmp Vt Set:  [560 mL-650 mL] 650 mL PEEP:  [5 cmH20] 5 cmH20 Plateau Pressure:  [18 cmH20-22 cmH20] 22 cmH20   Intake/Output Summary (Last 24 hours) at 10/26/2022 1043 Last data filed at 10/26/2022 1023 Gross per 24 hour  Intake 2585.16 ml  Output 3806 ml  Net -1220.84 ml    UOP 2635  Filed Weights   10/24/22 0431 10/25/22 0400 10/26/22 0432  Weight: 98.9 kg 95.3 kg 94.9 kg    Examination:   Physical exam: General: critically ill appearing man lying in bed in NAD HEENT: [place]/AT, eyes anicteric Neuro: RASS -5 on propofol, myoclonus with lip twitching and wincing with bilateral trapezius squeeze. No withdrawal from pain x 4 extremities-- toes shaking with toe nailbed pressure. No cough or gag today, intact corneal and pupillary reflexes.  Chest: CTAB, breathing synchronously with MV Heart: S1S2, RRR Abdomen: soft, NT Skin: warm, dry, no diffuse rashes. No erythema around lines.   Potassium 2.6 BUN 139 Creatinine 6.29 WBC 22.6 H/H 12.1/34.2 Co. ox 85% MRI brain: acute punctate infarction in inferior posterior cerebellum, previous small R frontoparietal infarction, chronic small vessel changes seen.  Urine culture: NG Blood cultures: NG  Resolved Hospital Problem list     Assessment & Plan:  Acute hypoxic respiratory failure with aspiration pneumonia and pulmonary edema -LTVV -VAP prevention protocol -PAD protocol for sedation -SAT & SBT when appropriate, mental status precludes extubation currently  Likely septic shock due to aspiration pneumonia -con't empiric zosyn -start weaning stress dose steroids- wean over next 4 days  Paroxysmal A fib with RVR Acute on chronic HFrEF with cardiogenic shock S/p PEA cardiac arrest in the setting of hypoxia -con't milrinone; weaning off NE -daily coox -doing well off IABP -con't amiodarone &  heparin -appreciate AHF's management  Acute metabolic, possibly anoxic encephalopathy  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 injury, MRI brain does not confirm this -appreciate neuro's management -keppra, renally dosed -con't supportive care; hopefully BUN will trend down as Cr has continued to improve -propofol, needs sedation vacation daily  Acute kidney injury due to ischemic ATN Anion gap acidosis  with lactic acidosis -maintain adequate perfusion -strict I/O -renally dose meds, avoid nephrotoxic meds -con't foley -if she has significant post-ATN diuresis, may need IVF  Shock liver, improving - monitor periodically  Hypokalemia, hypocalcemia -repletion -con't to monitor  Diabetes type 2 with hyperglycemia in the setting of stress dose steroid -Continue Semglee 20 units daily; start decreasing tomorrow with reduced dose steroids -SSI PRN -goal BG 140-180  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 -supportive care  Anemia, thrombocytopenia-likely due to critical illness - monitor -no acute indication for transfusion   Best Practice (right click and "Reselect all SmartList Selections" daily)   Diet/type: tubefeeds DVT prophylaxis: Systemic heparin GI prophylaxis: PPI Lines: Yes needed Foley: Yes still needed Code Status:  full code Last date of multidisciplinary goals of care discussion [11/26 patient's family was updated ]  Labs   CBC: Recent Labs  Lab 10/20/22 1715 10/20/22 1957 10/22/22 1619 10/22/22 1640 10/23/22 1631 10/23/22 1639 10/24/22 0422 10/24/22 0446 10/25/22 0457 10/26/22 0341  WBC 10.6*   < > 29.8*  --  28.7*  --   --  26.4* 24.9* 22.6*  NEUTROABS 7.7  --   --   --   --   --   --   --   --   --   HGB 13.1   < > 11.4*   < > 11.3* 11.9* 11.6* 11.5* 11.9* 12.1*  HCT 37.5*   < > 31.9*   < > 32.3* 35.0* 34.0* 32.8* 34.1* 34.2*  MCV 85.2   < > 84.6  --  85.4  --   --  85.2 84.8 83.6  PLT 236   < > 138*  --  122*  --   --  118* 125* 127*   < > = values in this interval not displayed.     Basic Metabolic Panel: Recent Labs  Lab 10/22/22 0217 10/22/22 0220 10/23/22 1631 10/23/22 1639 10/24/22 0422 10/24/22 0446 10/24/22 1756 10/25/22 0457 10/26/22 0341  NA 142   < > 142   < > 138 140 143 143 140  K 3.3*   < > 3.4*   < > 3.3* 3.4* 3.7 3.1* 2.6*  CL 93*   < > 92*  --   --  88* 92* 95* 97*  CO2 23   < > 24  --   --  '25 26 23  25  '$ GLUCOSE 338*   < > 140*  --   --  130* 118* 187* 172*  BUN 46*   < > 83*  --   --  96* 109* 126* 139*  CREATININE 3.88*   < > 5.94*  --   --  6.29* 6.48* 6.54* 6.29*  CALCIUM 7.8*   < > 7.8*  --   --  8.5* 8.3* 8.2* 8.0*  MG 2.3  --  2.5*  --   --  2.5*  --  2.5* 2.5*  PHOS 5.1*  --   --   --   --   --   --  9.0* 8.1*   < > = values in this interval not displayed.    GFR:  Estimated Creatinine Clearance: 13.8 mL/min (A) (by C-G formula based on SCr of 6.29 mg/dL (H)). Recent Labs  Lab 10/22/22 0541 10/22/22 1619 10/23/22 0555 10/23/22 1631 10/23/22 2055 10/24/22 0446 10/25/22 0457 10/26/22 0341  WBC  --  29.8*  --  28.7*  --  26.4* 24.9* 22.6*  LATICACIDVEN 5.8* 3.1* 2.3*  --  2.1*  --   --   --      This patient is critically ill with multiple organ system failure which requires frequent high complexity decision making, assessment, support, evaluation, and titration of therapies. This was completed through the application of advanced monitoring technologies and extensive interpretation of multiple databases. During this encounter critical care time was devoted to patient care services described in this note for 35 minutes.  Julian Hy, DO 10/26/22 10:43 AM [facility]  For contact information, see Amion. If no response to pager, please call PCCM consult pager. After hours, 7PM- 7AM, please call Elink.

## 2022-10-26 NOTE — Progress Notes (Signed)
ANTICOAGULATION CONSULT NOTE  Pharmacy Consult for heparin Indication:  atrial flutter  No Known Allergies  Patient Measurements: Height: '6\' 2"'$  (188 cm) Weight: 94.9 kg (209 lb 3.5 oz) IBW/kg (Calculated) : 82.2 Heparin Dosing Weight: 88 kg  Vital Signs: Temp: 98 F (36.7 C) (11/29 1945) Temp Source: Oral (11/29 1945) BP: 116/78 (11/29 1900) Pulse Rate: 105 (11/29 2016)  Labs: Recent Labs    10/24/22 0446 10/24/22 1756 10/25/22 0457 10/26/22 0341 10/26/22 1821  HGB 11.5*  --  11.9* 12.1*  --   HCT 32.8*  --  34.1* 34.2*  --   PLT 118*  --  125* 127*  --   APTT 79*  --  76* 55* 80*  HEPARINUNFRC 0.78*  --  0.61 0.31 0.41  CREATININE 6.29*   < > 6.54* 6.29* 5.71*   < > = values in this interval not displayed.     Estimated Creatinine Clearance: 15.2 mL/min (A) (by C-G formula based on SCr of 5.71 mg/dL (H)).   Medical History: Past Medical History:  Diagnosis Date   Hypertension    Leukemia ([place])    PAF (paroxysmal atrial fibrillation) ([place]) 04/27/2018    Medications:  Eliquis 5 mg BID PTA   Assessment: [name] presented with increasing dyspnea on exertion for the past 2 weeks. Was found to be in atrial flutter with RVR on initial EKG. Due to worsening respiratory status, he was intubated overnight and started on vasopressor support. He went into PEA arrest and required 6 minutes of CPR and epi prior to ROSC. Given cardiogenic shock, he was taken to the cath lab this morning and an IABP was placed for further support. He has been taking Eliquis PTA for atrial flutter and last dose was 11/23 at 0900. Pharmacy has been consulted for heparin dosing.   IABP removed 11/28 and heparin was restarted at 2150. aPTT subtherapeutic at 55 this morning, on 1400 units/hr. Heparin level of 0.31 close to correlating with heparin level. Hgb 12.1, PLTs 127 stable. Per RN, no issues with heparin infusion, and bleeding from groin site on 11/28 has now resolved.   Heparin level  and PTT therapeutic tonight and correlating. We will continue with current rate and follow up in AM. Will use heparin from here on.   Goal of Therapy:  Heparin level 0.3-0.7 units/ml aPTT 66-102 seconds Monitor platelets by anticoagulation protocol: Yes   Plan:  Cont heparin 1450 units/hr  Daily heparin level  Monitor heparin level, CBC, renal function, and s/sx of bleeding daily  Onnie Boer, PharmD, [place], AAHIVP, CPP Infectious Disease Pharmacist 10/26/2022 8:40 PM

## 2022-10-26 NOTE — Progress Notes (Signed)
[place] Progress Note Patient Name: Xyler Terpening DOB: 14-Mar-1956 MRN: [id number]   Date of Service  10/26/2022  HPI/Events of Note  Notified of potassium of 2.6.  Creatinine 6.29  eICU Interventions  Placed order for 21mq potassium chloride IV.  Will continue to monitor serial K.         Nevaeh Casillas M DELA CRUZ 10/26/2022, 5:23 AM

## 2022-10-27 ENCOUNTER — Inpatient Hospital Stay (HOSPITAL_COMMUNITY): Payer: Medicare HMO

## 2022-10-27 DIAGNOSIS — J810 Acute pulmonary edema: Secondary | ICD-10-CM | POA: Diagnosis not present

## 2022-10-27 DIAGNOSIS — N170 Acute kidney failure with tubular necrosis: Secondary | ICD-10-CM | POA: Diagnosis not present

## 2022-10-27 DIAGNOSIS — R570 Cardiogenic shock: Secondary | ICD-10-CM | POA: Diagnosis not present

## 2022-10-27 DIAGNOSIS — L8990 Pressure ulcer of unspecified site, unspecified stage: Secondary | ICD-10-CM | POA: Insufficient documentation

## 2022-10-27 DIAGNOSIS — I4891 Unspecified atrial fibrillation: Secondary | ICD-10-CM | POA: Diagnosis not present

## 2022-10-27 DIAGNOSIS — I469 Cardiac arrest, cause unspecified: Secondary | ICD-10-CM | POA: Diagnosis not present

## 2022-10-27 DIAGNOSIS — J9600 Acute respiratory failure, unspecified whether with hypoxia or hypercapnia: Secondary | ICD-10-CM | POA: Diagnosis not present

## 2022-10-27 DIAGNOSIS — N19 Unspecified kidney failure: Secondary | ICD-10-CM

## 2022-10-27 DIAGNOSIS — R4182 Altered mental status, unspecified: Secondary | ICD-10-CM | POA: Diagnosis not present

## 2022-10-27 DIAGNOSIS — G9340 Encephalopathy, unspecified: Secondary | ICD-10-CM

## 2022-10-27 DIAGNOSIS — N179 Acute kidney failure, unspecified: Secondary | ICD-10-CM | POA: Diagnosis not present

## 2022-10-27 LAB — BASIC METABOLIC PANEL
Anion gap: 19 — ABNORMAL HIGH (ref 5–15)
Anion gap: 18 — ABNORMAL HIGH (ref 5–15)
BUN: 146 mg/dL — ABNORMAL HIGH (ref 8–23)
BUN: 150 mg/dL — ABNORMAL HIGH (ref 8–23)
CO2: 29 mmol/L (ref 22–32)
CO2: 28 mmol/L (ref 22–32)
Calcium: 8.2 mg/dL — ABNORMAL LOW (ref 8.9–10.3)
Calcium: 7.7 mg/dL — ABNORMAL LOW (ref 8.9–10.3)
Chloride: 101 mmol/L (ref 98–111)
Chloride: 106 mmol/L (ref 98–111)
Creatinine, Ser: 5.55 mg/dL — ABNORMAL HIGH (ref 0.61–1.24)
Creatinine, Ser: 5.26 mg/dL — ABNORMAL HIGH (ref 0.61–1.24)
GFR, Estimated: 11 mL/min — ABNORMAL LOW (ref >60)
GFR, Estimated: 11 mL/min — ABNORMAL LOW (ref >60)
Glucose, Bld: 157 mg/dL — ABNORMAL HIGH (ref 70–99)
Glucose, Bld: 176 mg/dL — ABNORMAL HIGH (ref 70–99)
Potassium: 2.6 mmol/L — CL (ref 3.5–5.1)
Potassium: 2.6 mmol/L — CL (ref 3.5–5.1)
Sodium: 149 mmol/L — ABNORMAL HIGH (ref 135–145)
Sodium: 152 mmol/L — ABNORMAL HIGH (ref 135–145)

## 2022-10-27 LAB — CBC
HCT: 33 % — ABNORMAL LOW (ref 39.0–52.0)
Hemoglobin: 11.8 g/dL — ABNORMAL LOW (ref 13.0–17.0)
MCH: 29.4 pg (ref 26.0–34.0)
MCHC: 35.8 g/dL (ref 30.0–36.0)
MCV: 82.1 fL (ref 80.0–100.0)
Platelets: 133 10*3/uL — ABNORMAL LOW (ref 150–400)
RBC: 4.02 MIL/uL — ABNORMAL LOW (ref 4.22–5.81)
RDW: 14.6 % (ref 11.5–15.5)
WBC: 16.8 10*3/uL — ABNORMAL HIGH (ref 4.0–10.5)
nRBC: 10.7 % — ABNORMAL HIGH (ref 0.0–0.2)

## 2022-10-27 LAB — RENAL FUNCTION PANEL
Albumin: 2.4 g/dL — ABNORMAL LOW (ref 3.5–5.0)
Anion gap: 16 — ABNORMAL HIGH (ref 5–15)
BUN: 117 mg/dL — ABNORMAL HIGH (ref 8–23)
CO2: 27 mmol/L (ref 22–32)
Calcium: 7.7 mg/dL — ABNORMAL LOW (ref 8.9–10.3)
Chloride: 106 mmol/L (ref 98–111)
Creatinine, Ser: 3.88 mg/dL — ABNORMAL HIGH (ref 0.61–1.24)
GFR, Estimated: 17 mL/min — ABNORMAL LOW (ref >60)
Glucose, Bld: 188 mg/dL — ABNORMAL HIGH (ref 70–99)
Phosphorus: 4.6 mg/dL (ref 2.5–4.6)
Potassium: 3 mmol/L — ABNORMAL LOW (ref 3.5–5.1)
Sodium: 149 mmol/L — ABNORMAL HIGH (ref 135–145)

## 2022-10-27 LAB — GLUCOSE, CAPILLARY
Glucose-Capillary: 172 mg/dL — ABNORMAL HIGH (ref 70–99)
Glucose-Capillary: 69 mg/dL — ABNORMAL LOW (ref 70–99)
Glucose-Capillary: 161 mg/dL — ABNORMAL HIGH (ref 70–99)
Glucose-Capillary: 164 mg/dL — ABNORMAL HIGH (ref 70–99)
Glucose-Capillary: 173 mg/dL — ABNORMAL HIGH (ref 70–99)
Glucose-Capillary: 185 mg/dL — ABNORMAL HIGH (ref 70–99)
Glucose-Capillary: 194 mg/dL — ABNORMAL HIGH (ref 70–99)

## 2022-10-27 LAB — PHOSPHORUS: Phosphorus: 6.7 mg/dL — ABNORMAL HIGH (ref 2.5–4.6)

## 2022-10-27 LAB — COOXEMETRY PANEL
Carboxyhemoglobin: 2.3 % — ABNORMAL HIGH (ref 0.5–1.5)
Carboxyhemoglobin: 2 % — ABNORMAL HIGH (ref 0.5–1.5)
Methemoglobin: <0.7 % (ref 0.0–1.5)
Methemoglobin: 0.9 % (ref 0.0–1.5)
O2 Saturation: 88.5 %
O2 Saturation: 91.3 %
Total hemoglobin: 12.1 g/dL (ref 12.0–16.0)
Total hemoglobin: 12.1 g/dL (ref 12.0–16.0)

## 2022-10-27 LAB — MAGNESIUM: Magnesium: 2.3 mg/dL (ref 1.7–2.4)

## 2022-10-27 LAB — HEPARIN LEVEL (UNFRACTIONATED): Heparin Unfractionated: 0.4 IU/mL (ref 0.30–0.70)

## 2022-10-27 MED ORDER — PRISMASOL BGK 4/2.5 32-4-2.5 MEQ/L REPLACEMENT SOLN: Status: DC

## 2022-10-27 MED ORDER — "THROMBI-PAD 3""X3"" EX PADS"
1.0000 | MEDICATED_PAD | Freq: Once | CUTANEOUS | Status: AC
  Administered 2022-10-27: 1 via TOPICAL
  Filled 2022-10-27: qty 1

## 2022-10-27 MED ORDER — PROSOURCE TF20 ENFIT COMPATIBL EN LIQD
60.0000 mL | Freq: Three times a day (TID) | ENTERAL | Status: DC
  Administered 2022-10-27 – 2022-10-28 (×3): 60 mL
  Filled 2022-10-27 (×3): qty 60

## 2022-10-27 MED ORDER — FREE WATER
200.0000 mL | Status: DC
  Administered 2022-10-27 – 2022-10-28 (×10): 200 mL

## 2022-10-27 MED ORDER — HEPARIN SODIUM (PORCINE) 1000 UNIT/ML DIALYSIS
1000.0000 [IU] | INTRAMUSCULAR | Status: DC | PRN
  Filled 2022-10-27: qty 3

## 2022-10-27 MED ORDER — POTASSIUM CHLORIDE 10 MEQ/50ML IV SOLN
10.0000 meq | INTRAVENOUS | Status: AC
  Administered 2022-10-27 (×6): 10 meq via INTRAVENOUS
  Filled 2022-10-27 (×6): qty 50

## 2022-10-27 MED ORDER — POTASSIUM CHLORIDE 20 MEQ PO PACK
40.0000 meq | PACK | Freq: Once | ORAL | Status: AC
  Administered 2022-10-27: 40 meq
  Filled 2022-10-27: qty 2

## 2022-10-27 MED ORDER — CLONAZEPAM 1 MG PO TABS
2.0000 mg | ORAL_TABLET | Freq: Three times a day (TID) | ORAL | Status: DC
  Administered 2022-10-27 – 2022-10-28 (×5): 2 mg
  Filled 2022-10-27 (×5): qty 2

## 2022-10-27 MED ORDER — PIPERACILLIN-TAZOBACTAM 3.375 G IVPB
3.3750 g | Freq: Three times a day (TID) | INTRAVENOUS | Status: AC
  Administered 2022-10-27: 3.375 g via INTRAVENOUS
  Filled 2022-10-27: qty 50

## 2022-10-27 MED ORDER — PRISMASOL BGK 4/2.5 32-4-2.5 MEQ/L EC SOLN: Status: DC

## 2022-10-27 MED ORDER — HEPARIN SODIUM (PORCINE) 1000 UNIT/ML DIALYSIS
1000.0000 [IU] | INTRAMUSCULAR | Status: DC | PRN
  Administered 2022-10-27: 2400 [IU] via INTRAVENOUS_CENTRAL
  Filled 2022-10-27: qty 6
  Filled 2022-10-27: qty 3

## 2022-10-27 MED ORDER — RENA-VITE PO TABS
1.0000 | ORAL_TABLET | Freq: Every day | ORAL | Status: DC
  Administered 2022-10-27: 1
  Filled 2022-10-27: qty 1

## 2022-10-27 NOTE — Progress Notes (Signed)
NAME:  Brendan Ellis, MRN:  [id number], DOB:  1956/02/06, LOS: 7 ADMISSION DATE:  10/20/2022, CONSULTATION DATE:  10/21/2022 REFERRING MD:  Dr. Posey Pronto, [facility], CHIEF COMPLAINT:  Short of breath   History of Present Illness:  66 yo male former smoker developed cough, sore throat and dyspnea about 1 week prior to admission.  Started on omnicef by his PCP, but symptoms progressed.  He presented to [facility] on 10/20/22.  He is followed by cardiology in [facility] for chemo induced CM and a fib.  He had A fib with RVR in the ER and started on amiodarone.  CT angio chest 2.2 x 1.8 cm pretracheal LN, small chronic loculated effusions, consolidative ASD in lower lobes, and interlobular septal thickening.  Started on IV ABx, diuretics and supplemental oxygen.  He was tried on Bipap but wasn't able to tolerate this.  PCCM consulted to arrange for management in ICU.  Pertinent  Medical History  Non ischemic dilated CM with HFrEF, s/p AICD, PAF on eliquis, Leukemia 2019, HTN  Significant Hospital Events: Including procedures, antibiotic start and stop dates in addition to other pertinent events   11/23 Admit 11/24 Transfer to ICU 11/26 seizure on EEG with sedation paused  Interim History / Subjective:  Not waking up still, periodic myoclonus.   Objective   Blood pressure 118/73, pulse 66, temperature 98 F (36.7 C), temperature source Oral, resp. rate 14, height '6\' 2"'$  (1.88 m), weight 93.9 kg, SpO2 100 %. CVP:  [3 mmHg-7 mmHg] 3 mmHg  Vent Mode: PRVC FiO2 (%):  [40 %-100 %] 40 % Set Rate:  [14 bmp] 14 bmp Vt Set:  [650 mL] 650 mL PEEP:  [5 cmH20] 5 cmH20 Plateau Pressure:  [16 cmH20-22 cmH20] 20 cmH20   Intake/Output Summary (Last 24 hours) at 10/27/2022 0805 Last data filed at 10/27/2022 0700 Gross per 24 hour  Intake 2293.78 ml  Output 6275 ml  Net -3981.22 ml   UOP 2635  Filed Weights   10/25/22 0400 10/26/22 0432 10/27/22 0500  Weight: 95.3 kg 94.9 kg 93.9 kg    Examination:   Physical  exam: General: critically ill appearing man lying in bed in NAD HEENT: [place]/AT, eyes anicteric Neuro: examined on no sedation- not responding to verbal stimulation. Has myoclonus whenever you touch his feet. Not withdrawing from noxious stimulation. extremities-- no edema or cyanosis Chest: breathing comfortably on MV, CTAB Heart: S1S2, RRR Abdomen: soft, NT Skin: warm, dry, no rashes  Na+ 152 Potassium 2.6 BUN 146 Creatinine 5.55 WBC 16.8 H/H 11.8/33 Co. ox 88.5% Blood cultures: NG, final   Resolved Hospital Problem list     Assessment & Plan:  Acute hypoxic respiratory failure with aspiration pneumonia and pulmonary edema -LTVV -VAP prevention protocol -PAD protocol-- limiting as much as able for wakeup assessment -VAP prevention protocol -SAT daily & SBT when it is appropriate; mental status precludes extubation currently  Likely septic shock due to aspiration pneumonia -weaning stress dose steroids -complete 1 week of zosyn  Paroxysmal A fib with RVR Acute on chronic HFrEF with cardiogenic shock S/p PEA cardiac arrest in the setting of hypoxia -weaning milrinone per AHF -daily coox -con't amiodarone, heparin gtt  Hypernatremia -correct with CRRT -FWF -monitor  Acute metabolic, possibly anoxic encephalopathy  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 injury, MRI brain does not confirm this -appreciate neurology's management -keppra, clonazepam for myoclonus and seizure suppression -CRRT for metabolic clearance to help with neuroprognostication  Acute kidney injury due to ischemic ATN Anion gap acidosis with  lactic acidosis -maintain adequate perfusion -strict I/Os- running even on CRRT -renally dose meds, avoid nephrotoxic meds -con't foley catheter  Shock liver, improving - monitor periodically  Hypokalemia, hypocalcemia -repleted -will correct with HD as well  Diabetes type 2 with hyperglycemia in the setting of stress dose steroid -decreased  semglee to 16 untis daily with decreasing steroids -SSI PRN -goal BG 140-180  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 CRRT for metabolic clearance. He has told them he would not want a trach or PEG.  Best Practice (right click and "Reselect all SmartList Selections" daily)   Diet/type: tubefeeds DVT prophylaxis: Systemic heparin GI prophylaxis: PPI Lines: CVC, HD, Aline needed Foley: Yes still needed Code Status:  full code Last date of multidisciplinary goals of care discussion [11/30  patient's son and wife were updated with heart failure team]  Labs   CBC: Recent Labs  Lab 10/20/22 1715 10/20/22 1957 10/23/22 1631 10/23/22 1639 10/24/22 0422 10/24/22 0446 10/25/22 0457 10/26/22 0341 10/27/22 0300  WBC 10.6*   < > 28.7*  --   --  26.4* 24.9* 22.6* 16.8*  NEUTROABS 7.7  --   --   --   --   --   --   --   --   HGB 13.1   < > 11.3*   < > 11.6* 11.5* 11.9* 12.1* 11.8*  HCT 37.5*   < > 32.3*   < > 34.0* 32.8* 34.1* 34.2* 33.0*  MCV 85.2   < > 85.4  --   --  85.2 84.8 83.6 82.1  PLT 236   < > 122*  --   --  118* 125* 127* 133*   < > = values in this interval not displayed.     Basic Metabolic Panel: Recent Labs  Lab 10/22/22 0217 10/22/22 0220 10/23/22 1631 10/23/22 1639 10/24/22 0446 10/24/22 1756 10/25/22 0457 10/26/22 0341 10/26/22 1821 10/27/22 0300  NA 142   < > 142   < > 140 143 143 140 147* 149*  K 3.3*   < > 3.4*   < > 3.4* 3.7 3.1* 2.6* 2.7* 2.6*  CL 93*   < > 92*  --  88* 92* 95* 97* 102 101  CO2 23   < > 24  --  '25 26 23 25 26 29  '$ GLUCOSE 338*   < > 140*  --  130* 118* 187* 172* 163* 157*  BUN 46*   < > 83*  --  96* 109* 126* 139* 139* 146*  CREATININE 3.88*   < > 5.94*  --  6.29* 6.48* 6.54* 6.29* 5.71* 5.55*  CALCIUM 7.8*   < >  7.8*  --  8.5* 8.3* 8.2* 8.0* 8.2* 8.2*  MG 2.3  --  2.5*  --  2.5*  --  2.5* 2.5*  --  2.3  PHOS 5.1*  --   --   --   --   --  9.0* 8.1*  --  6.7*   < > = values in this interval not displayed.    GFR: Estimated Creatinine Clearance: 15.6 mL/min (A) (by C-G formula based on SCr of 5.55 mg/dL (H)). Recent Labs  Lab 10/22/22 0541 10/22/22 1619 10/23/22 0555 10/23/22 1631 10/23/22 2055 10/24/22 0446 10/25/22 0457 10/26/22 0341 10/27/22 0300  WBC  --  29.8*  --    < >  --  26.4* 24.9* 22.6* 16.8*  LATICACIDVEN 5.8* 3.1* 2.3*  --  2.1*  --   --   --   --    < > = values in this interval not displayed.     This patient is critically ill with multiple organ system failure which requires frequent high complexity decision making, assessment, support, evaluation, and titration of therapies. This was completed through the application of advanced monitoring technologies and extensive interpretation of multiple databases. During this encounter critical care time was devoted to patient care services described in this note for 40 minutes.  Julian Hy, DO 10/27/22 2:28 PM  Pulmonary & Critical Care  For contact information, see Amion. If no response to pager, please call PCCM consult pager. After hours, 7PM- 7AM, please call Elink.

## 2022-10-27 NOTE — Progress Notes (Signed)
Date and time results received: 10/27/22 1257 Test: potassium Critical Value: 2.6 Name of Provider Notified: Dr. Candiss Norse Orders Received? Or Actions Taken?: 40 mg KCl per tube once, recheck BMET at 1600

## 2022-10-27 NOTE — Progress Notes (Addendum)
Nutrition Follow-up  DOCUMENTATION CODES:   Not applicable  INTERVENTION:   Tube Feeding via Cortrak:  Vital 1.5 at 60 ml/hr with Pro-Source TF20 60 mL TID Goal regimen provides 158 g of protein, 2400 kcals and 1094 mL of free water  Free water of 200 mL q 4 hours added by MD today to address free water deficit. Total free water of 2294 mL. May need to consider IV hydration given significant post ATN diuresis; will be difficult to correct free water deficit via enteral route along with such high UOP (6L in 24 hours)  Add Renal MVI daily   NUTRITION DIAGNOSIS:   Inadequate oral intake related to acute illness as evidenced by NPO status.  Being addressed via TF   GOAL:   Patient will meet greater than or equal to 90% of their needs  Progressing  MONITOR:   Vent status, TF tolerance, Labs, Weight trends  REASON FOR ASSESSMENT:   Consult, Ventilator Enteral/tube feeding initiation and management  ASSESSMENT:   66 yo male  admitted post PEA arrest in setting of hypoxia, acute on chronic heart failure with cardiogenic shock.  Pt with seizures, confirmed by EEG, with probable anoxic brain injury.  PMH includes hx of AML s/p chemo 2019, non ischemic dilated CM with HFrEF, HTN.  11/23 PEA arrest 11/24 IABP 11/26 Seizure on EEG when sedation paused 11/27 TF initiated at trickle 11/29 Cortrak placed 11/30 CRRT initiated  Pt remains on vent support, myoclonic jerks which improves with sedation, currently off sedation but not waking up. Neurology following. Remains off pressors  Vital 1.5 up to 50 ml/hr via Cortrak  Post ATN diuresis with almost 6L UOP in 24 hours, CRRT starting with no UF and 4K fluids  Free water flush started today to address hypernatremia; current flush 200 mL q 4 hours  Labs: sodium 152 (H), potassium 2.6 (L), BUN 150, Creatinine 5.26,  Meds: ss novolog, semglee   Diet Order:   Diet Order             Diet NPO time specified  Diet effective now                    EDUCATION NEEDS:   Not appropriate for education at this time  Skin:  Skin Assessment: Reviewed RN Assessment  Last BM:  11/30 +small amount via rectal tube  Height:   Ht Readings from Last 1 Encounters:  10/20/22 '6\' 2"'$  (1.88 m)    Weight:   Wt Readings from Last 1 Encounters:  10/27/22 93.9 kg     BMI:  Body mass index is 26.58 kg/m.  Estimated Nutritional Needs:   Kcal:  2200-2400 kcals  Protein:  150-170 g  Fluid:  >/= 2L  Kerman Passey MS, RDN, LDN, CNSC Registered Dietitian 3 Clinical Nutrition RD Pager and On-Call Pager Number Located in [place]

## 2022-10-27 NOTE — Consult Note (Addendum)
[facility] Renal Consultation Note  Requesting MD: Glori Bickers, MD Indication for Consultation:  AKI   Chief complaint: shortness of breath on presentation   HPI:  Brendan Ellis is a 66 y.o. male with a history of hypertension, CHF with reduced EF (EF 35% in 2020 per charting), s/p AICD, paroxysmal atrial fibrillation, and AML in remission who presented to the hospital on 11/23 with shortness of breath.  Found to have afib with RVR and was placed on high flow nasal cannula then BIPAP.   On 11/24 he experienced PEA arrest and required 6 minutes of CPR and epi.  Based on clinical exam with myoclonus, the team was concerned for anoxic brain injury however he had a recent MRI which did not suggest the same.  CHF, pulmonary, and neurology are following closely.  Nephrology is consulted for acute kidney injury.  His creatinine is 5.55 with recent peak of 6.48.  Bun is rising and up to 146.  He has not been waking up following his event.  There is concern that he truly does have anoxic injury however acute renal failure is clouding this picture at that time.  Critical care feels that volume is not an issue - he may be depleted.  His wife is his Advice worker and his family has been closely involved with his care.  His son is a Careers information officer at [employer].  I spoke with his son via phone and he is supportive of his dad starting dialysis.  I have called his wife x 3 and not reached his wife; she is not in the lobby or at bedside though was earlier today.  The phone number for his wife does not allow me to leave a voicemail message for her.  His son states that they (the patient's wife and the patient's son) have discussed dialysis together with the cardiology and pulmonology teams this AM including the risks/benefits/indications.  Per nursing, critical care, and CHF team, his wife consented for the dialysis access line and is hoping to get started on dialysis soon.    Creatinine, Ser   Date/Time Value Ref Range Status  10/27/2022 03:00 AM 5.55 (H) 0.61 - 1.24 mg/dL Final  10/26/2022 06:21 PM 5.71 (H) 0.61 - 1.24 mg/dL Final  10/26/2022 03:41 AM 6.29 (H) 0.61 - 1.24 mg/dL Final  10/25/2022 04:57 AM 6.54 (H) 0.61 - 1.24 mg/dL Final  10/24/2022 05:56 PM 6.48 (H) 0.61 - 1.24 mg/dL Final  10/24/2022 04:46 AM 6.29 (H) 0.61 - 1.24 mg/dL Final  10/23/2022 04:31 PM 5.94 (H) 0.61 - 1.24 mg/dL Final  10/23/2022 05:08 AM 5.43 (H) 0.61 - 1.24 mg/dL Final  10/22/2022 04:19 PM 4.71 (H) 0.61 - 1.24 mg/dL Final  10/22/2022 02:17 AM 3.88 (H) 0.61 - 1.24 mg/dL Final  10/21/2022 08:35 PM 3.35 (H) 0.61 - 1.24 mg/dL Final  10/21/2022 02:14 PM 3.05 (H) 0.61 - 1.24 mg/dL Final  10/21/2022 05:00 AM 2.44 (H) 0.61 - 1.24 mg/dL Final  10/21/2022 01:01 AM 1.77 (H) 0.61 - 1.24 mg/dL Final  10/20/2022 05:15 PM 0.64 0.61 - 1.24 mg/dL Final  12/21/2020 06:10 AM 1.20 0.61 - 1.24 mg/dL Final  12/20/2020 06:20 AM 1.40 (H) 0.61 - 1.24 mg/dL Final  12/19/2020 11:12 AM 1.63 (H) 0.61 - 1.24 mg/dL Final  12/19/2020 06:53 AM 1.46 (H) 0.61 - 1.24 mg/dL Final  12/19/2020 01:09 AM 1.43 (H) 0.61 - 1.24 mg/dL Final  12/18/2020 10:03 PM 1.68 (H) 0.61 - 1.24 mg/dL Final  12/18/2020 02:04 PM 1.90 (H)  0.61 - 1.24 mg/dL Final  04/29/2018 04:58 AM 0.94 0.61 - 1.24 mg/dL Final  04/28/2018 05:17 AM 0.95 0.61 - 1.24 mg/dL Final  04/27/2018 03:10 PM 0.91 0.61 - 1.24 mg/dL Final     PMHx:   Past Medical History:  Diagnosis Date   Hypertension    Leukemia ([place])    PAF (paroxysmal atrial fibrillation) ([place]) 04/27/2018    Past Surgical History:  Procedure Laterality Date   ARTERIAL LINE INSERTION N/A 10/21/2022   Procedure: ARTERIAL LINE INSERTION;  Surgeon: Jolaine Artist, MD;  Location: [facility];  Service: Cardiovascular;  Laterality: N/A;   IABP INSERTION N/A 10/21/2022   Procedure: IABP Insertion;  Surgeon: Jolaine Artist, MD;  Location: [place] [facility];  Service: Cardiovascular;   Laterality: N/A;   RIGHT HEART CATH N/A 10/21/2022   Procedure: RIGHT HEART CATH;  Surgeon: Jolaine Artist, MD;  Location: [place] [place] [facility];  Service: Cardiovascular;  Laterality: N/A;    Family Hx:  Family History  Problem Relation Age of Onset   Hypertension Mother    Diabetes Mother     Social History:  reports that he has never smoked. He has never used smokeless tobacco. He reports that he does not drink alcohol and does not use drugs.  Allergies: No Known Allergies  Medications: Prior to Admission medications   Medication Sig Start Date End Date Taking? Authorizing Provider  apixaban (ELIQUIS) 5 MG TABS tablet Take 5 mg by mouth 2 (two) times daily. 08/03/22  Yes [provider]    I have reviewed the patient's current and reported prior to admission medications.  Eliquis is the only home med listed and this seems unlikely.   Labs:     Latest Ref Rng & Units 10/27/2022    3:00 AM 10/26/2022    6:21 PM 10/26/2022    3:41 AM  BMP  Glucose 70 - 99 mg/dL 157  163  172   BUN 8 - 23 mg/dL 146  139  139   Creatinine 0.61 - 1.24 mg/dL 5.55  5.71  6.29   Sodium 135 - 145 mmol/L 149  147  140   Potassium 3.5 - 5.1 mmol/L 2.6  2.7  2.6   Chloride 98 - 111 mmol/L 101  102  97   CO2 22 - 32 mmol/L '29  26  25   '$ Calcium 8.9 - 10.3 mg/dL 8.2  8.2  8.0     Urinalysis    Component Value Date/Time   COLORURINE YELLOW 10/21/2022 0515   APPEARANCEUR CLOUDY (A) 10/21/2022 0515   LABSPEC 1.020 10/21/2022 0515   PHURINE 6.0 10/21/2022 0515   GLUCOSEU NEGATIVE 10/21/2022 0515   HGBUR MODERATE (A) 10/21/2022 0515   BILIRUBINUR NEGATIVE 10/21/2022 0515   KETONESUR NEGATIVE 10/21/2022 0515   PROTEINUR >300 (A) 10/21/2022 0515   NITRITE NEGATIVE 10/21/2022 0515   LEUKOCYTESUR NEGATIVE 10/21/2022 0515     ROS: Unable to obtain secondary to intubated and obtunded   Physical Exam: Vitals:   10/27/22 0829 10/27/22 0900  BP: 112/87 133/78  Pulse:  71  Resp: 18 (!)  25  Temp:    SpO2:  99%     General: adult male in bed critically ill   HEENT: NCAT Eyes: eyes are closed  Neck: trachea midline; normal circumference Heart: S1S2 no rub Lungs: coarse mechanical breath sounds  Abdomen: soft/nd Extremities: no pitting edema lower extremities trace edema upper extremities; no cyanosis or clubbing Skin: no rash  on extremities exposed  Neuro: does not; no overt myoclonus on my exam   GU foley catheter with large amount of urine Access RIJ nontunneled dialysis catheter   Assessment/Plan:  # AKI  - Secondary to ischemic ATN - recently with post-ATN diuresis and is nonoliguric  - Start CRRT - 4K fluids; zero UF (we are allowing him to be a little positive; discussed with pulm) - BID electrolytes - I have left my contact information for the nurse to reach out when his wife comes back or calls.  She has previously consented for dialysis but I have not personally been able to speak to her     # Cardiac Arrest - supportive care per cardiology and ICU teams   # Myoclonus  - There is concern for anoxic injury.  - Neurology is following the patient   # Hypokalemia - Repletion is ordered - we are starting CRRT - all 4K fluids  - BID electrolytes   # Hypernatremia - free water deficit   - See order for enteric free water from today  # Hyperphosphatemia   - Secondary to AKI  - Dowtrending  - Starting CRRT  # Transaminitis  - Secondary to ischemic insults - Improving   Thank you for the consult.  Please do not hesitate to contact me with any questions regarding our patient.   Claudia Desanctis 10/27/2022, 10:58 AM  Spoke with his wife who consented for dialysis.  We discussed risks/benefits/indications for dialysis.  She wants to do anything that can help him.  All questions answered.   Claudia Desanctis, MD 11:33 AM 10/27/2022

## 2022-10-27 NOTE — Progress Notes (Addendum)
Advanced Heart Failure Rounding Note   Subjective:    Intubated.   Brain MRI 11/29 did not show any anoxic/hypoxic brain injury.  Now off all sedation. Not responding.   SCr starting to improve, 6.3>>5.6, making urine but not filtering, BUN trending up 139>>146. K 2.6 LFTs trending down   Off NE. Remains on milrinone 0.25. Co-ox 88% (?accurate, repeat pending). CVP 9. MAPs 80s  NSR on tele    Objective:   Weight Range:  Vital Signs:   Temp:  [97.8 F (36.6 C)-98.2 F (36.8 C)] 98 F (36.7 C) (11/30 0400) Pulse Rate:  [66-113] 66 (11/30 0700) Resp:  [14-33] 14 (11/30 0700) BP: (105-148)/(59-106) 118/73 (11/30 0700) SpO2:  [90 %-100 %] 100 % (11/30 0700) Arterial Line BP: (108-166)/(47-86) 142/55 (11/30 0700) FiO2 (%):  [40 %-100 %] 40 % (11/30 0318) Weight:  [93.9 kg] 93.9 kg (11/30 0500) Last BM Date : 10/26/22  Weight change: Filed Weights   10/25/22 0400 10/26/22 0432 10/27/22 0500  Weight: 95.3 kg 94.9 kg 93.9 kg    Intake/Output:   Intake/Output Summary (Last 24 hours) at 10/27/2022 0817 Last data filed at 10/27/2022 0800 Gross per 24 hour  Intake 2293.78 ml  Output 6575 ml  Net -4281.22 ml    PHYSICAL EXAM: CVP 9  General:  Intubated, off sedation but not responding  HEENT: normal + ETT Neck: supple. RIJ introducer Cor: PMI nondisplaced. RRR. No rubs, gallops or murmurs. Lungs: intubated and clear Abdomen: soft, nontender, nondistended. No hepatosplenomegaly. No bruits or masses. Good bowel sounds. Extremities: no cyanosis, clubbing, rash, trace bilateraly edema Neuro: intubated, not responding, + myoclonus    Telemetry: NSR 60s Personally reviewed    Labs: Basic Metabolic Panel: Recent Labs  Lab 10/22/22 0217 10/22/22 0220 10/23/22 1631 10/23/22 1639 10/24/22 0446 10/24/22 1756 10/25/22 0457 10/26/22 0341 10/26/22 1821 10/27/22 0300  NA 142   < > 142   < > 140 143 143 140 147* 149*  K 3.3*   < > 3.4*   < > 3.4* 3.7 3.1* 2.6*  2.7* 2.6*  CL 93*   < > 92*  --  88* 92* 95* 97* 102 101  CO2 23   < > 24  --  '25 26 23 25 26 29  '$ GLUCOSE 338*   < > 140*  --  130* 118* 187* 172* 163* 157*  BUN 46*   < > 83*  --  96* 109* 126* 139* 139* 146*  CREATININE 3.88*   < > 5.94*  --  6.29* 6.48* 6.54* 6.29* 5.71* 5.55*  CALCIUM 7.8*   < > 7.8*  --  8.5* 8.3* 8.2* 8.0* 8.2* 8.2*  MG 2.3  --  2.5*  --  2.5*  --  2.5* 2.5*  --  2.3  PHOS 5.1*  --   --   --   --   --  9.0* 8.1*  --  6.7*   < > = values in this interval not displayed.    Liver Function Tests: Recent Labs  Lab 10/20/22 1715 10/22/22 0217 10/23/22 1631 10/24/22 0446 10/26/22 0341  AST 53* >10,000* 3,777* 2,247* 366*  ALT 59* 5,030* 5,167* 4,555* 2,524*  ALKPHOS 32* 94 119 104 124  BILITOT 1.6* 2.6* 2.2* 2.2* 2.1*  PROT 7.1 5.8* 6.0* 5.9* 6.4*  ALBUMIN 3.2* 2.6* 2.7* 2.6* 2.6*   No results for input(s): "LIPASE", "AMYLASE" in the last 168 hours. No results for input(s): "AMMONIA" in the last 168 hours.  CBC: Recent Labs  Lab 10/20/22 1715 10/20/22 1957 10/23/22 1631 10/23/22 1639 10/24/22 0422 10/24/22 0446 10/25/22 0457 10/26/22 0341 10/27/22 0300  WBC 10.6*   < > 28.7*  --   --  26.4* 24.9* 22.6* 16.8*  NEUTROABS 7.7  --   --   --   --   --   --   --   --   HGB 13.1   < > 11.3*   < > 11.6* 11.5* 11.9* 12.1* 11.8*  HCT 37.5*   < > 32.3*   < > 34.0* 32.8* 34.1* 34.2* 33.0*  MCV 85.2   < > 85.4  --   --  85.2 84.8 83.6 82.1  PLT 236   < > 122*  --   --  118* 125* 127* 133*   < > = values in this interval not displayed.    Cardiac Enzymes: No results for input(s): "CKTOTAL", "CKMB", "CKMBINDEX", "TROPONINI" in the last 168 hours.  BNP: BNP (last 3 results) Recent Labs    10/20/22 1715  BNP 1,300.4*    ProBNP (last 3 results) No results for input(s): "PROBNP" in the last 8760 hours.    Other results:  Imaging: DG Abd Portable 1V  Result Date: 10/26/2022 CLINICAL DATA:  Feeding tube placement EXAM: PORTABLE ABDOMEN - 1 VIEW  COMPARISON:  None Available. FINDINGS: Feeding tube enters the stomach with the tip near the pylorus. Normal bowel gas pattern.  Dual lead pacemaker AICD noted. IMPRESSION: Feeding tube tip near the pylorus. Electronically Signed   By: Franchot Gallo M.D.   On: 10/26/2022 12:20   MR BRAIN WO CONTRAST  Result Date: 10/26/2022 CLINICAL DATA:  Cardiac arrest.  Anoxic brain injury suspected. EXAM: MRI HEAD WITHOUT CONTRAST TECHNIQUE: Multiplanar, multiecho pulse sequences of the brain and surrounding structures were obtained without intravenous contrast. COMPARISON:  Head CT 10/23/2022 FINDINGS: Brain: There is a punctate acute infarction within the inferior posterior cerebellum on the right. No other acute infarction. No finding typical of hypoxic ischemic brain injury. No other focal finding affects the brainstem or cerebellum. Cerebral hemispheres show old small vessel ischemic changes within the deep white matter. There is an old right frontoparietal cortical infarction. No mass lesion, acute hemorrhage, hydrocephalus or extra-axial collection. Mesial temporal lobes appear symmetric and normal. Vascular: Major vessels at the base of the brain show flow. Skull and upper cervical spine: Negative Sinuses/Orbits: Paranasal sinuses are clear. Bilateral mastoid effusions as often seen in the setting of prolonged mechanical ventilation. Orbits appear normal. Other: None IMPRESSION: 1. Punctate acute infarction in the inferior posterior cerebellum on the right. No other acute infarction. No finding typical of hypoxic ischemic brain injury. 2. Old small right frontoparietal cortical infarction. Chronic small-vessel ischemic changes of the cerebral hemispheric deep white matter. 3. Bilateral mastoid effusions as often seen in the setting of prolonged mechanical ventilation. Electronically Signed   By: Nelson Chimes M.D.   On: 10/26/2022 10:28     Medications:     Scheduled Medications:  Chlorhexidine Gluconate  Cloth  6 each Topical Daily   clonazepam  2 mg Per Tube TID   docusate  100 mg Per Tube BID   feeding supplement (PROSource TF20)  60 mL Per Tube QID   free water  200 mL Per Tube Q4H   hydrocortisone sod succinate (SOLU-CORTEF) inj  100 mg Intravenous Daily   [START ON 10/28/2022] hydrocortisone sod succinate (SOLU-CORTEF) inj  50 mg Intravenous Daily   insulin aspart  0-15  Units Subcutaneous Q4H   insulin glargine-yfgn  16 Units Subcutaneous Daily   mouth rinse  15 mL Mouth Rinse Q2H   pantoprazole (PROTONIX) IV  40 mg Intravenous Daily   polyethylene glycol  17 g Per Tube Daily    Infusions:  sodium chloride 10 mL/hr at 10/26/22 1327   sodium chloride Stopped (10/25/22 2239)   sodium chloride     amiodarone 30 mg/hr (10/27/22 0700)   feeding supplement (VITAL 1.5 CAL) 40 mL/hr at 10/27/22 0700   heparin 1,450 Units/hr (10/27/22 0700)   levETIRAcetam Stopped (10/26/22 2018)   milrinone 0.25 mcg/kg/min (10/27/22 0700)   norepinephrine (LEVOPHED) Adult infusion Stopped (10/26/22 1134)   piperacillin-tazobactam (ZOSYN)  IV Stopped (10/27/22 0231)   potassium chloride 10 mEq (10/27/22 0801)   propofol (DIPRIVAN) infusion Stopped (10/26/22 1146)    PRN Medications: sodium chloride, Place/Maintain arterial line **AND** sodium chloride, acetaminophen **OR** acetaminophen, artificial tears, fentaNYL (SUBLIMAZE) injection, influenza vac split quadrivalent PF, LORazepam, midazolam, ondansetron (ZOFRAN) IV, ondansetron **OR** [DISCONTINUED] ondansetron (ZOFRAN) IV, mouth rinse, pneumococcal 20-valent conjugate vaccine, senna-docusate   Assessment/Plan:   1. 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 EF 35% in 2020. S/p Biotronik ICD - EF 35% -> 20% in setting of AF - Echo 10/21/22 EF 20% RV moderately reduced - S/p PEA arrest 11/23 - IABP out 11/28 - Co-ox 88% on milrinone 0.25. Off NE w/ stable MAP  - CVP 9, making urine but not  filtering. Will plan to start CRRT today    2. Suspected Anoxic brain injury - having seizures/myoclonus -> propofol/Keppra - Brain CT 10/21/22 - normal - Repeat CT 11/26, normal.   - Brain MRI 11/29 did not show any anoxic/hypoxic brain injury but remains unresponsive off sedation, + myoclonus. This is also in the setting of uremia, BUN 140s. Plan CRRT today for clearance, then reassess mental status    3. PEA arrest - 10/20/22 - CPR x 6 mins  4. PAF - likely cause of tachy CM - NSR this morning  - Continue heparin/IV amio  5. Non oliguric AKI - due to ATN/shock - SCr 0.8 -> 3.88 -> 5.43->6.29 ->6.54 -> 6.29->5.6 - Continue hemodynamic support - Renal function improving now but not filtering - Consult nephrology for CRRT    6. Acute hypoxic respiratory failure - due to HF/cardiac arrest - remains intubated  7. Shock liver - improving continue supportive care  8. Hypocalcemia/hypokalemia - K 2.5  - KCl supp   9. H/o AML  - s/p chemo 2019  Length of Stay: 7   Brendan Ellis  10/27/2022, 8:17 AM  Advanced Heart Failure Team Pager [phone] (M-F; 7a - 4p)  Please contact [facility] for night-coverage after hours (4p -7a ) and weekends on [email or web address]  Agree with above.   Remains intubated. On NE 0.25 Co-ox 88%. Off NE. Off propofol. Minimal myoclonic jerking this am  Back in NSR on amio. SCr improving but BUN still 149. LFT trending down   Brain MRI  no evidence of anoxic damage  General:  Intubated sedates HEENT: normal + ETT Neck: supple. JVP 10 Carotids 2+ bilat; no bruits. No lymphadenopathy or thryomegaly appreciated. Cor: PMI nondisplaced. Regular rate & rhythm. No rubs, gallops or murmurs. Lungs: clear Abdomen: soft, nontender, nondistended. No hepatosplenomegaly. No bruits or masses. Good bowel sounds. Extremities: no cyanosis, clubbing, rash, edema Neuro: intubated sedated. Unresponsive + myoclonus with babinski   Hemodynaically more  stable  but exam still very concerning for anoxic brain injury despite MRI findings. Situation confounded by markedly elevated BUN. Renal function recovering but still not clearing. Have d/w Renal and CCM. Will start CVVHD for clearance and to better assess mental status.   Supp K. Follow CVP and co-ox  CRITICAL CARE Performed by: Glori Bickers  Total critical care time: 45 minutes  Critical care time was exclusive of separately billable procedures and treating other patients.  Critical care was necessary to treat or prevent imminent or life-threatening deterioration.  Critical care was time spent personally by me (independent of midlevel providers or residents) on the following activities: development of treatment plan with patient and/or surrogate as well as nursing, discussions with consultants, evaluation of patient's response to treatment, examination of patient, obtaining history from patient or surrogate, ordering and performing treatments and interventions, ordering and review of laboratory studies, ordering and review of radiographic studies, pulse oximetry and re-evaluation of patient's condition.  Glori Bickers, MD  3:09 PM

## 2022-10-27 NOTE — Progress Notes (Signed)
ANTICOAGULATION CONSULT NOTE  Pharmacy Consult for heparin Indication:  atrial flutter  No Known Allergies  Patient Measurements: Height: '6\' 2"'$  (188 cm) Weight: 93.9 kg (207 lb 0.2 oz) IBW/kg (Calculated) : 82.2 Heparin Dosing Weight: 88 kg  Vital Signs: Temp: 98 F (36.7 C) (11/30 0400) Temp Source: Oral (11/30 0400) BP: 118/73 (11/30 0700) Pulse Rate: 66 (11/30 0700)  Labs: Recent Labs    10/25/22 0457 10/26/22 0341 10/26/22 1821 10/27/22 0300  HGB 11.9* 12.1*  --  11.8*  HCT 34.1* 34.2*  --  33.0*  PLT 125* 127*  --  133*  APTT 76* 55* 80*  --   HEPARINUNFRC 0.61 0.31 0.41 0.40  CREATININE 6.54* 6.29* 5.71* 5.55*     Estimated Creatinine Clearance: 15.6 mL/min (A) (by C-G formula based on SCr of 5.55 mg/dL (H)).   Medical History: Past Medical History:  Diagnosis Date   Hypertension    Leukemia ([place])    PAF (paroxysmal atrial fibrillation) ([place]) 04/27/2018    Medications:  Eliquis 5 mg BID PTA   Assessment: Mr. Dermody presented with increasing dyspnea on exertion for the past 2 weeks. Was found to be in atrial flutter with RVR on initial EKG. Due to worsening respiratory status, he was intubated overnight and started on vasopressor support. He went into PEA arrest and required 6 minutes of CPR and epi prior to ROSC. Given cardiogenic shock, he was taken to the cath lab this morning and an IABP was placed for further support. He has been taking Eliquis PTA for atrial flutter and last dose was 11/23 at 0900. Pharmacy has been consulted for heparin dosing.   IABP removed 11/28 and heparin was restarted at 2150. aPTT subtherapeutic at 55 this morning, on 1400 units/hr. Prior heparin level and aPTT correlating, so proceeded with monitoring heparin levels only. Confirmatory heparin level 0.40 therapeutic on 1450 units/hr. Hgb 11.8, PLTs 133 stable. Per RN, no issues with heparin infusion or bleeding.   Goal of Therapy:  Heparin level 0.3-0.7 units/ml aPTT 66-102  seconds Monitor platelets by anticoagulation protocol: Yes   Plan:  Cont heparin 1450 units/hr  Daily heparin level  Monitor heparin level, CBC, renal function, and s/sx of bleeding daily  Eliseo Gum, PharmD PGY1 Pharmacy Resident   10/27/2022  7:23 AM

## 2022-10-27 NOTE — Progress Notes (Signed)
Notified Singh MD of potassium level of 3.0 on evening labs. No new orders.

## 2022-10-27 NOTE — Progress Notes (Addendum)
[facility] Progress Note Patient Name: Brendan Ellis DOB: 09/07/1956 MRN: [id number]   Date of Service  10/27/2022  HPI/Events of Note  Hypokalemia - K+ = 2.6 and Creatinine = 5.55. Hypernatremia - Na+ = 149  eICU Interventions  Plan: Replace K+. Free water 200 mL per tube Q 4 hours. Repeat BMP at 12 noon.      Intervention Category Major Interventions: Electrolyte abnormality - evaluation and management  Allison Silva Eugene 10/27/2022, 4:18 AM

## 2022-10-27 NOTE — Procedures (Addendum)
Central Venous Catheter Insertion Procedure Note  Binyomin Brann  [id number]  [birth year]  Date:10/27/22  Time:10:03 AM   Provider Performing:Indea Dearman Mamie Nick Carlis Abbott    Procedure: Insertion of Non-tunneled Central Venous Catheter(36556)with US guidance (51700)    Indication(s) Hemodialysis  Consent Risks of the procedure as well as the alternatives and risks of each were explained to the patient and/or caregiver.  Consent for the procedure was obtained and is signed in the bedside chart  Anesthesia Topical only with 1% lidocaine   Timeout Verified patient identification, verified procedure, site/side was marked, verified correct patient position, special equipment/implants available, medications/allergies/relevant history reviewed, required imaging and test results available.  Sterile Technique Maximal sterile technique including full sterile barrier drape, hand hygiene, sterile gown, sterile gloves, mask, hair covering, sterile ultrasound probe cover (if used).  Procedure Description Area of catheter insertion was cleaned with chlorhexidine and draped in sterile fashion.   With real-time ultrasound guidance a HD catheter was placed into the right internal jugular vein. Guidewire confirmed in compressible vessel before dilation.  Nonpulsatile blood flow and easy flushing noted in all ports.  The catheter was sutured in place and sterile dressing applied.    Complications/Tolerance None; patient tolerated the procedure well. Chest X-ray is ordered to verify placement for internal jugular or subclavian cannulation.  Chest x-ray is not ordered for femoral cannulation.  EBL Minimal  Specimen(s) None  Julian Hy, DO 10/27/22 10:04 AM [facility]

## 2022-10-27 NOTE — Progress Notes (Signed)
Subjective: No acute events overnight.  Continues to have myoclonus with stimulation but does not last for too long after the stimulation stops.  Plan for CRRT   ROS: Unable to obtain due to poor mental status  Examination  Vital signs in last 24 hours: Temp:  [97.8 F (36.6 C)-98.2 F (36.8 C)] 98 F (36.7 C) (11/30 0400) Pulse Rate:  [66-113] 66 (11/30 0700) Resp:  [14-33] 14 (11/30 0700) BP: (105-148)/(59-106) 118/73 (11/30 0700) SpO2:  [90 %-100 %] 100 % (11/30 0700) Arterial Line BP: (108-166)/(47-86) 142/55 (11/30 0700) FiO2 (%):  [40 %-100 %] 40 % (11/30 0318) Weight:  [93.9 kg] 93.9 kg (11/30 0500)  General: lying in bed, intubated Neuro: Does not open eyes to noxious stimulation, PERRLA, corneal reflex intact, cough reflex intact, stimulation induced myoclonus in all 4 extremities and eyes but only lasting for a few seconds after stimulation stops  Basic Metabolic Panel: Recent Labs  Lab 10/22/22 0217 10/22/22 0220 10/23/22 1631 10/23/22 1639 10/24/22 0446 10/24/22 1756 10/25/22 0457 10/26/22 0341 10/26/22 1821 10/27/22 0300  NA 142   < > 142   < > 140 143 143 140 147* 149*  K 3.3*   < > 3.4*   < > 3.4* 3.7 3.1* 2.6* 2.7* 2.6*  CL 93*   < > 92*  --  88* 92* 95* 97* 102 101  CO2 23   < > 24  --  '25 26 23 25 26 29  '$ GLUCOSE 338*   < > 140*  --  130* 118* 187* 172* 163* 157*  BUN 46*   < > 83*  --  96* 109* 126* 139* 139* 146*  CREATININE 3.88*   < > 5.94*  --  6.29* 6.48* 6.54* 6.29* 5.71* 5.55*  CALCIUM 7.8*   < > 7.8*  --  8.5* 8.3* 8.2* 8.0* 8.2* 8.2*  MG 2.3  --  2.5*  --  2.5*  --  2.5* 2.5*  --  2.3  PHOS 5.1*  --   --   --   --   --  9.0* 8.1*  --  6.7*   < > = values in this interval not displayed.    CBC: Recent Labs  Lab 10/20/22 1715 10/20/22 1957 10/23/22 1631 10/23/22 1639 10/24/22 0422 10/24/22 0446 10/25/22 0457 10/26/22 0341 10/27/22 0300  WBC 10.6*   < > 28.7*  --   --  26.4* 24.9* 22.6* 16.8*  NEUTROABS 7.7  --   --   --   --   --    --   --   --   HGB 13.1   < > 11.3*   < > 11.6* 11.5* 11.9* 12.1* 11.8*  HCT 37.5*   < > 32.3*   < > 34.0* 32.8* 34.1* 34.2* 33.0*  MCV 85.2   < > 85.4  --   --  85.2 84.8 83.6 82.1  PLT 236   < > 122*  --   --  118* 125* 127* 133*   < > = values in this interval not displayed.     Coagulation Studies: No results for input(s): "LABPROT", "INR" in the last 72 hours.  Imaging No new brain imaging overnight   ASSESSMENT AND PLAN:  66 year old male with with history of A-fib on Eliquis, leukemia, status post cardiac arrest on 10/21/2022, ROSC achieved after about 6 minutes.  LTM EEG is predominantly showed continuous generalized slowing suggestive of severe encephalopathy.  On 10/23/2022, patient was noted to  have coughing and jerking of bilateral upper extremities after suctioning during which EEG showed generalized polyspikes consistent with myoclonic seizure.  However since then patient has had jerking of eyes and all extremities without definite EEG change.  MRI brain did not show any anoxic injury.    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  Plan is to keep off of sedation as well as a start CRRT today to see if there is any improvement in mental status.Son also states that patient would not want long-term trach/PEG.  Will reevaluate over the weekend.  However if patient does not have any significant improvement then it is unlikely that he will have meaningful neurologic recovery  -Continue seizure precautions   I have spent a total of 26 minutes with the patient reviewing hospital notes,  test results, labs and  examining the patient as well as establishing an assessment and plan.  > 50% of time was spent in direct patient care.   Zeb Comfort Epilepsy [facility] For questions after 5pm please refer to AMION to reach the Neurologist on call

## 2022-10-27 NOTE — Progress Notes (Signed)
Patient with progressively worsening myoclonic jerks since turning to be cleaned up around 1315. SBP 170s, generalized jerking throughout body. Clark MD notified. 50 mcg fentanyl given with relief.

## 2022-10-28 DIAGNOSIS — J9601 Acute respiratory failure with hypoxia: Secondary | ICD-10-CM | POA: Diagnosis not present

## 2022-10-28 DIAGNOSIS — Z9911 Dependence on respirator [ventilator] status: Secondary | ICD-10-CM

## 2022-10-28 DIAGNOSIS — R570 Cardiogenic shock: Secondary | ICD-10-CM | POA: Diagnosis not present

## 2022-10-28 DIAGNOSIS — I469 Cardiac arrest, cause unspecified: Secondary | ICD-10-CM | POA: Diagnosis not present

## 2022-10-28 DIAGNOSIS — N179 Acute kidney failure, unspecified: Secondary | ICD-10-CM | POA: Diagnosis not present

## 2022-10-28 DIAGNOSIS — I4891 Unspecified atrial fibrillation: Secondary | ICD-10-CM | POA: Diagnosis not present

## 2022-10-28 DIAGNOSIS — I5023 Acute on chronic systolic (congestive) heart failure: Secondary | ICD-10-CM | POA: Diagnosis not present

## 2022-10-28 LAB — RENAL FUNCTION PANEL
Albumin: 2.2 g/dL — ABNORMAL LOW (ref 3.5–5.0)
Albumin: 2.2 g/dL — ABNORMAL LOW (ref 3.5–5.0)
Anion gap: 11 (ref 5–15)
Anion gap: 10 (ref 5–15)
BUN: 74 mg/dL — ABNORMAL HIGH (ref 8–23)
BUN: 47 mg/dL — ABNORMAL HIGH (ref 8–23)
CO2: 27 mmol/L (ref 22–32)
CO2: 24 mmol/L (ref 22–32)
Calcium: 7.5 mg/dL — ABNORMAL LOW (ref 8.9–10.3)
Calcium: 7.9 mg/dL — ABNORMAL LOW (ref 8.9–10.3)
Chloride: 106 mmol/L (ref 98–111)
Chloride: 108 mmol/L (ref 98–111)
Creatinine, Ser: 2.55 mg/dL — ABNORMAL HIGH (ref 0.61–1.24)
Creatinine, Ser: 1.86 mg/dL — ABNORMAL HIGH (ref 0.61–1.24)
GFR, Estimated: 27 mL/min — ABNORMAL LOW (ref >60)
GFR, Estimated: 40 mL/min — ABNORMAL LOW (ref >60)
Glucose, Bld: 156 mg/dL — ABNORMAL HIGH (ref 70–99)
Glucose, Bld: 188 mg/dL — ABNORMAL HIGH (ref 70–99)
Phosphorus: 3.2 mg/dL (ref 2.5–4.6)
Phosphorus: 2.3 mg/dL — ABNORMAL LOW (ref 2.5–4.6)
Potassium: 2.8 mmol/L — ABNORMAL LOW (ref 3.5–5.1)
Potassium: 3.8 mmol/L (ref 3.5–5.1)
Sodium: 144 mmol/L (ref 135–145)
Sodium: 142 mmol/L (ref 135–145)

## 2022-10-28 LAB — MAGNESIUM: Magnesium: 2.2 mg/dL (ref 1.7–2.4)

## 2022-10-28 LAB — GLUCOSE, CAPILLARY
Glucose-Capillary: 155 mg/dL — ABNORMAL HIGH (ref 70–99)
Glucose-Capillary: 184 mg/dL — ABNORMAL HIGH (ref 70–99)
Glucose-Capillary: 130 mg/dL — ABNORMAL HIGH (ref 70–99)
Glucose-Capillary: 156 mg/dL — ABNORMAL HIGH (ref 70–99)
Glucose-Capillary: 171 mg/dL — ABNORMAL HIGH (ref 70–99)
Glucose-Capillary: 139 mg/dL — ABNORMAL HIGH (ref 70–99)

## 2022-10-28 LAB — TRIGLYCERIDES: Triglycerides: 71 mg/dL (ref <150)

## 2022-10-28 LAB — BASIC METABOLIC PANEL
Anion gap: 8 (ref 5–15)
BUN: 52 mg/dL — ABNORMAL HIGH (ref 8–23)
CO2: 25 mmol/L (ref 22–32)
Calcium: 7.5 mg/dL — ABNORMAL LOW (ref 8.9–10.3)
Chloride: 107 mmol/L (ref 98–111)
Creatinine, Ser: 1.95 mg/dL — ABNORMAL HIGH (ref 0.61–1.24)
GFR, Estimated: 38 mL/min — ABNORMAL LOW (ref >60)
Glucose, Bld: 167 mg/dL — ABNORMAL HIGH (ref 70–99)
Potassium: 3.6 mmol/L (ref 3.5–5.1)
Sodium: 140 mmol/L (ref 135–145)

## 2022-10-28 LAB — CBC
HCT: 32.4 % — ABNORMAL LOW (ref 39.0–52.0)
Hemoglobin: 11.3 g/dL — ABNORMAL LOW (ref 13.0–17.0)
MCH: 29.8 pg (ref 26.0–34.0)
MCHC: 34.9 g/dL (ref 30.0–36.0)
MCV: 85.5 fL (ref 80.0–100.0)
Platelets: 102 10*3/uL — ABNORMAL LOW (ref 150–400)
RBC: 3.79 MIL/uL — ABNORMAL LOW (ref 4.22–5.81)
RDW: 15.7 % — ABNORMAL HIGH (ref 11.5–15.5)
WBC: 13.7 10*3/uL — ABNORMAL HIGH (ref 4.0–10.5)
nRBC: 7.1 % — ABNORMAL HIGH (ref 0.0–0.2)

## 2022-10-28 LAB — COOXEMETRY PANEL
Carboxyhemoglobin: 2.2 % — ABNORMAL HIGH (ref 0.5–1.5)
Methemoglobin: 0.8 % (ref 0.0–1.5)
O2 Saturation: 71.4 %
Total hemoglobin: 11.4 g/dL — ABNORMAL LOW (ref 12.0–16.0)

## 2022-10-28 LAB — HEPARIN LEVEL (UNFRACTIONATED)
Heparin Unfractionated: 0.64 IU/mL (ref 0.30–0.70)
Heparin Unfractionated: 0.74 IU/mL — ABNORMAL HIGH (ref 0.30–0.70)

## 2022-10-28 LAB — AMMONIA: Ammonia: 30 umol/L (ref 9–35)

## 2022-10-28 MED ORDER — AMIODARONE HCL IN DEXTROSE 360-4.14 MG/200ML-% IV SOLN: 30.0000 mg/h | INTRAVENOUS | Status: AC

## 2022-10-28 MED ORDER — SODIUM PHOSPHATES 45 MMOLE/15ML IV SOLN
15.0000 mmol | Freq: Once | INTRAVENOUS | Status: DC
  Filled 2022-10-28: qty 5

## 2022-10-28 MED ORDER — FREE WATER: 200.0000 mL | Status: AC

## 2022-10-28 MED ORDER — PANTOPRAZOLE SODIUM 40 MG IV SOLR: 40.0000 mg | Freq: Every day | INTRAVENOUS | Status: AC

## 2022-10-28 MED ORDER — ORAL CARE MOUTH RINSE: 15.0000 mL | OROMUCOSAL | Status: DC | PRN

## 2022-10-28 MED ORDER — LIDOCAINE-EPINEPHRINE (PF) 2 %-1:200000 IJ SOLN
INTRAMUSCULAR | Status: AC
  Administered 2022-10-28: 20 mL
  Filled 2022-10-28: qty 20

## 2022-10-28 MED ORDER — NOREPINEPHRINE 4 MG/250ML-% IV SOLN: 0.0000 ug/min | INTRAVENOUS | Status: AC

## 2022-10-28 MED ORDER — HYDROCORTISONE SOD SUC (PF) 100 MG IJ SOLR: 50.0000 mg | Freq: Every day | INTRAMUSCULAR | Status: AC

## 2022-10-28 MED ORDER — POLYETHYLENE GLYCOL 3350 17 G PO PACK: 17.0000 g | PACK | Freq: Every day | ORAL | 0 refills | Status: AC

## 2022-10-28 MED ORDER — ORAL CARE MOUTH RINSE
15.0000 mL | OROMUCOSAL | Status: DC
  Administered 2022-10-28 (×5): 15 mL via OROMUCOSAL

## 2022-10-28 MED ORDER — MILRINONE LACTATE IN DEXTROSE 20-5 MG/100ML-% IV SOLN: 0.2500 ug/kg/min | INTRAVENOUS | Status: AC

## 2022-10-28 MED ORDER — HEPARIN SODIUM (PORCINE) 1000 UNIT/ML DIALYSIS: 1000.0000 [IU] | INTRAMUSCULAR | Status: AC | PRN

## 2022-10-28 MED ORDER — NOREPINEPHRINE 4 MG/250ML-% IV SOLN
0.0000 ug/min | INTRAVENOUS | Status: DC
  Administered 2022-10-28: 2 ug/min via INTRAVENOUS
  Filled 2022-10-28 (×2): qty 250

## 2022-10-28 MED ORDER — POTASSIUM CHLORIDE 20 MEQ PO PACK
40.0000 meq | PACK | Freq: Once | ORAL | Status: AC
  Administered 2022-10-28: 40 meq
  Filled 2022-10-28: qty 2

## 2022-10-28 MED ORDER — DOCUSATE SODIUM 50 MG/5ML PO LIQD: 100.0000 mg | Freq: Two times a day (BID) | ORAL | 0 refills | Status: AC

## 2022-10-28 MED ORDER — HEPARIN (PORCINE) 25000 UT/250ML-% IV SOLN: 1300.0000 [IU]/h | INTRAVENOUS | Status: AC

## 2022-10-28 MED ORDER — FENTANYL CITRATE PF 50 MCG/ML IJ SOSY: 50.0000 ug | PREFILLED_SYRINGE | INTRAMUSCULAR | 0 refills | Status: AC | PRN

## 2022-10-28 MED ORDER — CLONAZEPAM 2 MG PO TABS: 2.0000 mg | ORAL_TABLET | Freq: Three times a day (TID) | ORAL | 0 refills | Status: AC

## 2022-10-28 MED ORDER — SENNOSIDES-DOCUSATE SODIUM 8.6-50 MG PO TABS: 1.0000 | ORAL_TABLET | Freq: Every evening | ORAL | Status: AC | PRN

## 2022-10-28 MED ORDER — LEVETIRACETAM IN NACL 500 MG/100ML IV SOLN: 500.0000 mg | Freq: Two times a day (BID) | INTRAVENOUS | Status: AC

## 2022-10-28 MED ORDER — INSULIN GLARGINE-YFGN 100 UNIT/ML ~~LOC~~ SOLN: 16.0000 [IU] | Freq: Every day | SUBCUTANEOUS | 11 refills | Status: AC

## 2022-10-28 MED ORDER — INSULIN ASPART 100 UNIT/ML IJ SOLN: 0.0000 [IU] | INTRAMUSCULAR | 11 refills | Status: AC

## 2022-10-28 NOTE — Progress Notes (Addendum)
Subjective: Was started on CRRT yesterday.  Has had increasing myoclonus overnight.  Wife and daughter at bedside.  ROS: Unable to obtain due to poor mental status  Examination  Vital signs in last 24 hours: Temp:  [97.8 F (36.6 C)-98.8 F (37.1 C)] 97.8 F (36.6 C) (12/01 0800) Pulse Rate:  [48-74] 74 (12/01 0804) Resp:  [14-21] 17 (12/01 1100) BP: (80-142)/(47-111) 84/68 (12/01 1100) SpO2:  [92 %-100 %] 93 % (12/01 1133) Arterial Line BP: (83-173)/(34-89) 102/48 (12/01 1100) FiO2 (%):  [40 %] 40 % (12/01 1133) Weight:  [94 kg] 94 kg (12/01 0400)  General: lying in bed, intubated Neuro: Does not open eyes to noxious stimulation, PERRLA, corneal reflex intact, cough reflex intact, stimulation induced myoclonus in all 4 extremities and eyes   Basic Metabolic Panel: Recent Labs  Lab 10/24/22 0446 10/24/22 1756 10/25/22 0457 10/26/22 0341 10/26/22 1821 10/27/22 0300 10/27/22 1116 10/27/22 1557 10/28/22 0313  NA 140   < > 143 140 147* 149* 152* 149* 144  K 3.4*   < > 3.1* 2.6* 2.7* 2.6* 2.6* 3.0* 2.8*  CL 88*   < > 95* 97* 102 101 106 106 106  CO2 25   < > '23 25 26 29 28 27 27  '$ GLUCOSE 130*   < > 187* 172* 163* 157* 176* 188* 156*  BUN 96*   < > 126* 139* 139* 146* 150* 117* 74*  CREATININE 6.29*   < > 6.54* 6.29* 5.71* 5.55* 5.26* 3.88* 2.55*  CALCIUM 8.5*   < > 8.2* 8.0* 8.2* 8.2* 7.7* 7.7* 7.5*  MG 2.5*  --  2.5* 2.5*  --  2.3  --   --  2.2  PHOS  --   --  9.0* 8.1*  --  6.7*  --  4.6 3.2   < > = values in this interval not displayed.    CBC: Recent Labs  Lab 10/24/22 0446 10/25/22 0457 10/26/22 0341 10/27/22 0300 10/28/22 0313  WBC 26.4* 24.9* 22.6* 16.8* 13.7*  HGB 11.5* 11.9* 12.1* 11.8* 11.3*  HCT 32.8* 34.1* 34.2* 33.0* 32.4*  MCV 85.2 84.8 83.6 82.1 85.5  PLT 118* 125* 127* 133* 102*     Coagulation Studies: No results for input(s): "LABPROT", "INR" in the last 72 hours.  Imaging  No new brain imaging overnight     ASSESSMENT AND PLAN:   66 year old male with with history of A-fib on Eliquis, leukemia, status post cardiac arrest on 10/21/2022, ROSC achieved after about 6 minutes.  LTM EEG is predominantly showed continuous generalized slowing suggestive of severe encephalopathy.  On 10/23/2022, patient was noted to have coughing and jerking of bilateral upper extremities after suctioning during which EEG showed generalized polyspikes consistent with myoclonic seizure.  However since then patient has had jerking of eyes and all extremities without definite EEG change.  MRI brain did not show any anoxic injury.    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 -Have had goals of care conversation with patient's son and wife on 10/28/2022.  Plan is to keep off of sedation and see if there is any improvement now that patient is also on  CRRT till Sunday.  Patient would not have wanted trach/PEG.  Therefore if no improvement by Sunday, family will transition to comfort care. -Also discussed about potentially switching from full code to DNR -Continue seizure precautions -Neurology will follow peripherally. please call us for any further questions.   I have spent a total of 37 minutes with the patient reviewing hospital notes,  test results, labs and examining the patient as well as establishing an assessment and plan.  > 50% of time was spent in direct patient care.   Zeb Comfort Epilepsy [facility] For questions after 5pm please refer to AMION to reach the Neurologist on call

## 2022-10-28 NOTE — Progress Notes (Signed)
At bedside for PIV insertion. Upon assessment with U/S, bilateral extremities very edematous with no suitable vessels to cannulate. RN and PA aware. Recommend CL access for further vascular needs.

## 2022-10-28 NOTE — TOC Initial Note (Addendum)
Transition of Care [facility]) - Initial/Assessment Note    Patient Details  Name: Brendan Ellis MRN: [id number] Date of Birth: 03/17/1956  Transition of Care [facility]) CM/SW Contact:    Erenest Rasher, RN Phone Number: [phone]  10/28/2022, 2:55 PM  Clinical Narrative:   Received call from pt's dtr and plan is transfer to [facility]. Pt's bed is pending.                 HF TOC CM contacted dtr, Fatima and left message for return call. Will need PT recommendations, SNF vs IP rehab. Will continue to follow for dc needs.    Expected Discharge Plan: Skilled Nursing Facility Barriers to Discharge: Continued Medical Work up   Patient Goals and CMS Choice        Expected Discharge Plan and Services Expected Discharge Plan: [place]                                              Prior Living Arrangements/Services                    Criminal Activity/Legal Involvement Pertinent to Current Situation/Hospitalization: No - Comment as needed  Activities of Daily Living Home Assistive Devices/Equipment: None ADL Screening (condition at time of admission) Patient's cognitive ability adequate to safely complete daily activities?: Yes Is the patient deaf or have difficulty hearing?: No Does the patient have difficulty seeing, even when wearing glasses/contacts?: No Does the patient have difficulty concentrating, remembering, or making decisions?: No Patient able to express need for assistance with ADLs?: Yes Does the patient have difficulty dressing or bathing?: No Independently performs ADLs?: Yes (appropriate for developmental age) Does the patient have difficulty walking or climbing stairs?: No Weakness of Legs: None Weakness of Arms/Hands: None  Permission Sought/Granted Permission sought to share information with : Case Manager                Emotional Assessment              Admission diagnosis:  Hypokalemia  [E87.6] Hypomagnesemia [E83.42] Atrial fibrillation with rapid ventricular response ([place]) [I48.91] Patient Active Problem List   Diagnosis Date Noted   Uremia 10/27/2022   ATN (acute tubular necrosis) ([place]) 10/27/2022   Cardiac arrest, cause unspecified ([place]) 10/27/2022   Acute encephalopathy 10/27/2022   Pressure injury of skin 10/27/2022   Atrial fibrillation with rapid ventricular response ([place]) 10/20/2022   Acute respiratory failure with hypoxia ([place]) 10/20/2022   Hyperosmolar hyperglycemic state (HHS) ([place]) 12/18/2020   New onset type 2 diabetes mellitus ([place]) 12/18/2020   AKI (acute kidney injury) ([place]) 12/18/2020   Hypokalemia 04/28/2018   Hypomagnesemia 04/28/2018   PAC (premature atrial contraction) 04/28/2018   Acute exacerbation of CHF (congestive heart failure) ([place]) 04/27/2018   Acute on chronic HFrEF (heart failure with reduced ejection fraction) ([place]) 04/27/2018   Community acquired pneumonia of right lower lobe of lung 04/27/2018   AML (acute myeloid leukemia) in remission ([place]) 54/65/6812   Acute systolic CHF (congestive heart failure) ([place]) 04/27/2018   PCP:  Robert Bellow, PA-C Pharmacy:   [facility] [address] [place] [address] Phone: [phone] Fax: [facility] [place] [address]  Phone: [phone] Fax: [phone]  [id number] [id number]: [phone]: [phone]    Social Determinants of Health (SDOH) Interventions    Readmission Risk Interventions     No data to display

## 2022-10-28 NOTE — Progress Notes (Signed)
ANTICOAGULATION CONSULT NOTE  Pharmacy Consult for heparin Indication:  atrial flutter  No Known Allergies  Patient Measurements: Height: '6\' 2"'$  (188 cm) Weight: 94 kg (207 lb 3.7 oz) IBW/kg (Calculated) : 82.2 Heparin Dosing Weight: 88 kg  Vital Signs: Temp: 98.6 F (37 C) (12/01 0327) Temp Source: Axillary (12/01 0327) BP: 126/68 (12/01 0400) Pulse Rate: 67 (12/01 0400)  Labs: Recent Labs    10/26/22 0341 10/26/22 1821 10/27/22 0300 10/27/22 1116 10/27/22 1557 10/28/22 0313  HGB 12.1*  --  11.8*  --   --  11.3*  HCT 34.2*  --  33.0*  --   --  32.4*  PLT 127*  --  133*  --   --  102*  APTT 55* 80*  --   --   --   --   HEPARINUNFRC 0.31 0.41 0.40  --   --  0.64  CREATININE 6.29* 5.71* 5.55* 5.26* 3.88* 2.55*     Estimated Creatinine Clearance: 34 mL/min (A) (by C-G formula based on SCr of 2.55 mg/dL (H)).   Medical History: Past Medical History:  Diagnosis Date   Hypertension    Leukemia ([place])    PAF (paroxysmal atrial fibrillation) ([place]) 04/27/2018    Medications:  Eliquis 5 mg BID PTA   Assessment: Mr. Keltz presented with increasing dyspnea on exertion for the past 2 weeks. Was found to be in atrial flutter with RVR on initial EKG. Due to worsening respiratory status, he was intubated overnight and started on vasopressor support. He went into PEA arrest and required 6 minutes of CPR and epi prior to ROSC. Given cardiogenic shock, he was taken to the cath lab this morning and an IABP was placed for further support. He has been taking Eliquis PTA for atrial flutter and last dose was 11/23 at 0900. Pharmacy has been consulted for heparin dosing.   IABP removed 11/28 and heparin was restarted at 2150. aPTT subtherapeutic at 55 this morning, on 1400 units/hr. Prior heparin level and aPTT correlating, so proceeded with monitoring heparin levels only. Confirmatory heparin level 0.40 therapeutic on 1450 units/hr. Hgb 11.8, PLTs 133 stable. Per RN, no issues with  heparin infusion or bleeding.   12/1 AM update:  RN reports some oozing from line Therapeutic but on higher end  Goal of Therapy:  Heparin level 0.3-0.7 units/ml aPTT 66-102 seconds Monitor platelets by anticoagulation protocol: Yes   Plan:  Dec heparin slightly to 1400 units/hr 1300 heparin level  Narda Bonds, PharmD, [place] Pharmacist Phone: [phone]

## 2022-10-28 NOTE — Progress Notes (Signed)
NAME:  Brendan Ellis, MRN:  [id number], DOB:  1956/04/24, LOS: 8 ADMISSION DATE:  10/20/2022, CONSULTATION DATE:  10/21/2022 REFERRING MD:  Dr. Posey Pronto, [facility], CHIEF COMPLAINT:  Short of breath   History of Present Illness:  66 yo male former smoker developed cough, sore throat and dyspnea about 1 week prior to admission.  Started on omnicef by his PCP, but symptoms progressed.  He presented to [facility] on 10/20/22.  He is followed by cardiology in [facility] for chemo induced CM and a fib.  He had A fib with RVR in the ER and started on amiodarone.  CT angio chest 2.2 x 1.8 cm pretracheal LN, small chronic loculated effusions, consolidative ASD in lower lobes, and interlobular septal thickening.  Started on IV ABx, diuretics and supplemental oxygen.  He was tried on Bipap but wasn't able to tolerate this.  PCCM consulted to arrange for management in ICU.  Pertinent  Medical History  Non ischemic dilated CM with HFrEF, s/p AICD, PAF on eliquis, Leukemia 2019, HTN  Significant Hospital Events: Including procedures, antibiotic start and stop dates in addition to other pertinent events   11/23 Admit 11/24 Transfer to ICU 11/26 seizure on EEG with sedation paused  Interim History / Subjective:  On CRRT, some oozing from HD catheter since he was rolled overnight. Not responding to family.  Objective   Blood pressure (!) 112/97, pulse 63, temperature 98.6 F (37 C), temperature source Axillary, resp. rate 17, height '6\' 2"'$  (1.88 m), weight 94 kg, SpO2 100 %. CVP:  [4 mmHg-57 mmHg] 41 mmHg  Vent Mode: PRVC FiO2 (%):  [40 %] 40 % Set Rate:  [14 bmp] 14 bmp Vt Set:  [650 mL] 650 mL PEEP:  [5 cmH20] 5 cmH20 Plateau Pressure:  [15 cmH20-19 cmH20] 19 cmH20   Intake/Output Summary (Last 24 hours) at 10/28/2022 0755 Last data filed at 10/28/2022 0700 Gross per 24 hour  Intake 4201.34 ml  Output 3550 ml  Net 651.34 ml   UOP 2635  Filed Weights   10/26/22 0432 10/27/22 0500 10/28/22 0400  Weight: 94.9  kg 93.9 kg 94 kg    Examination:   Physical exam: General: critically ill appearing man lying in bed in NAD HEENT: /AT, eyes anicteric Neuro: Examined off sedation. Constant myoclonus, worse with any stimulation. Frequently wincing eyes. Biting on ETT, no obvious coughing with ETT suctioning. Not obviously withdrawing from pain in extremities, but increased amount of myoclonus.  Chest: synchronous with MV, thin clear secretions, rhonchi cleared with suctioning Heart: S1S2, RRR Abdomen: soft, NT Skin: warm, dry, no rashes  Na+ 144 Potassium 2.8 BUN 74 Creatinine 2.55 (on CRRT) WBC 13.7 H/H 11.3/32.4 Platelets 102 Co. ox 71.4%    Resolved Hospital Problem list   Hypernatremia Septic shock due to aspiration pneumonia  Assessment & Plan:  Acute hypoxic respiratory failure with aspiration pneumonia and pulmonary edema -LTVV -VAP prevention protocol - PAD protocol for sedation - Daily SAT and SBT as appropriate-ongoing SAT to facilitate neuro prognostication.  Unfortunately myoclonus or might be times where he does require suppression of this.  Paroxysmal A fib with RVR Acute on chronic HFrEF with cardiogenic shock S/p PEA cardiac arrest in the setting of hypoxia -Milrinone per advanced heart failure team.  Daily cooximetry. 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  -check ammonia -con't efforts at supportive care  Acute kidney injury due to ischemic ATN Anion gap acidosis with lactic acidosis -Maintain adequate renal perfusion. -Strict I/O, renally dose meds , avoid nephrotoxic meds  Shock liver, improving -monitor periodically -check ammonia level  Hypokalemia,  hypocalcemia -replete, HD will correct as well   Diabetes type 2 with hyperglycemia in the setting of stress dose steroid -con't semglee to 16 untis daily with decreasing steroids -Sliding scale insulin as needed - Goal blood glucose 140-180  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 -Continue supportive care  Anemia, thrombocytopenia-likely due to critical illness - Monitor, no current indication for transfusion - Transfuse for hemoglobin less than 7 or hemodynamically significant bleeding.  GOC -Continue holding all sedation.  Giving 2 meds to help control myoclonus and seizures.  I updated his son via phone and his daughter and wife at bedside.  I am concerned with the near constant myoclonus during my exam.  Best Practice (right click and "Reselect all SmartList Selections" daily)   Diet/type: tubefeeds DVT prophylaxis: Systemic heparin GI prophylaxis: PPI Lines: CVC, HD, Aline needed Foley: Yes still needed Code Status:  full code Last date of multidisciplinary goals of care discussion [12/1 son, wife, daughter updated]  Labs   CBC: Recent Labs  Lab 10/24/22 0446 10/25/22 0457 10/26/22 0341 10/27/22 0300 10/28/22 0313  WBC 26.4* 24.9* 22.6* 16.8* 13.7*  HGB 11.5* 11.9* 12.1* 11.8* 11.3*  HCT 32.8* 34.1* 34.2* 33.0* 32.4*  MCV 85.2 84.8 83.6 82.1 85.5  PLT 118* 125* 127* 133* 102*     Basic Metabolic Panel: Recent Labs  Lab 10/24/22 0446 10/24/22 1756 10/25/22 0457 10/26/22 0341 10/26/22 1821 10/27/22 0300 10/27/22 1116 10/27/22 1557 10/28/22 0313  NA 140   < > 143 140 147* 149* 152* 149* 144  K 3.4*   < > 3.1* 2.6* 2.7* 2.6* 2.6* 3.0* 2.8*  CL 88*   < > 95* 97* 102 101 106 106 106  CO2 25   < > '23 25 26 29 28 27 27  '$ GLUCOSE 130*   < > 187* 172* 163* 157* 176* 188* 156*  BUN 96*   < > 126* 139* 139* 146* 150* 117* 74*  CREATININE 6.29*   < > 6.54* 6.29* 5.71* 5.55* 5.26* 3.88* 2.55*  CALCIUM 8.5*   < > 8.2* 8.0* 8.2* 8.2* 7.7* 7.7* 7.5*   MG 2.5*  --  2.5* 2.5*  --  2.3  --   --  2.2  PHOS  --   --  9.0* 8.1*  --  6.7*  --  4.6 3.2   < > = values in this interval not displayed.    GFR: Estimated Creatinine Clearance: 34 mL/min (A) (by C-G formula based on SCr of 2.55 mg/dL (H)). Recent Labs  Lab 10/22/22 0541 10/22/22 1619 10/23/22 0555 10/23/22 1631 10/23/22 2055 10/24/22 0446 10/25/22 0457 10/26/22 0341 10/27/22 0300 10/28/22 0313  WBC  --  29.8*  --    < >  --    < > 24.9* 22.6* 16.8* 13.7*  LATICACIDVEN 5.8* 3.1* 2.3*  --  2.1*  --   --   --   --   --    < > = values in this interval not displayed.     This patient is critically ill with multiple organ system failure which requires frequent high complexity decision making, assessment, support, evaluation, and titration of therapies. This was completed through the application of  advanced monitoring technologies and extensive interpretation of multiple databases. During this encounter critical care time was devoted to patient care services described in this note for 50 minutes.  Julian Hy, DO 10/28/22 8:55 AM [facility]  For contact information, see Amion. If no response to pager, please call PCCM consult pager. After hours, 7PM- 7AM, please call [place].

## 2022-10-28 NOTE — Progress Notes (Addendum)
CSW received consult from RN. Patients daughter Manuela Schwartz wanted to speak with CSW. CSW spoke with patients daughter Manuela Schwartz at bedside. Fitima had some questions regarding HCPOA. CSW informed patients daughter CSW will reach out to chaplain to stop by to help assist with any questions regarding HCPOA. Patients daughter thanked CSW. All questions answered. No further questions reported at this time.CSW paged chaplain and received callback from chaplain Juliann Pulse who confirmed she will stop by to assist with questions regarding HCPOA.

## 2022-10-28 NOTE — Progress Notes (Signed)
Pt placed on PS/CPAP 5/5 on 40% and is tolerating well. RT will monitor.

## 2022-10-28 NOTE — Progress Notes (Signed)
Bleeding around HD catheter Dressing removed, oozing from insertion site with tension on HD catheter. Injected lidocaine with 1% epi around the insertion site of the catheter and changed sterile dressing. Biopatch and thrombipad in place. Oozing stopped when the dressing was placed. RN Marcene Brawn assisted.  Julian Hy, DO 10/28/22 7:55 AM [facility]

## 2022-10-28 NOTE — Progress Notes (Signed)
Report called to receiving RN at [facility]. Patient will be going to [id number] at the main [facility]. The phone number to the unit is [phone]. We will notify the unit when [facility] arrives to transport the patient.

## 2022-10-28 NOTE — Discharge Summary (Signed)
Physician Discharge Summary  Patient ID: Brendan Ellis MRN: [id number] DOB/AGE: 06-14-1956 66 y.o.  Admit date: 10/20/2022 Discharge date: 10/28/2022  Problem List Principal Problem:   Atrial fibrillation with rapid ventricular response (HCC) Active Problems:   Acute on chronic HFrEF (heart failure with reduced ejection fraction) (HCC)   AML (acute myeloid leukemia) in remission ([place])   Hypokalemia   Hypomagnesemia   Acute respiratory failure with hypoxia (HCC)   Uremia   ATN (acute tubular necrosis) (HCC)   Cardiac arrest, cause unspecified ([place])   Acute encephalopathy   Pressure injury of skin   On mechanically assisted ventilation (HCC)  HPI: 66 yo M PMH AML (in remission), 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 Associated cough, DOE for 1wk pta. Notably, pt may have stopped taking home medications for some time. Started on omnicef by his PCP, but symptoms progressed prompting ER presentation 10/20/22. He required increasing respiratory support, from 15L HFNC to BiPAP. CTA chest with 2.2 x 1.8 cm pretracheal LN, small chronic loculated effusions, consolidative ASD in lower lobes, and interlobular septal thickening. Started on IV ABx, diuretics and supplemental oxygen.   He was not able to tolerate BiPAP, PCCM was consulted in this setting 10/21/22. He required intubation and post-induction sustained a 6 minute PEA arrest. In addition to these, hospital course has been marked by cardiogenic shock requiring IABP (removed 11/28), non oliguric acute renal failure requiring CRRT, seizure with persistent myoclonus, and encephalopathy concerning for anoxic injury.   10/28/2022 Patient accepted in transfer to [facility] Course:  10/20/22 admit for acute hypoxic respiratory failure. Admit to hospitalist service with concern for acute on chronic HF, vs infection. Started on abx, O2 -- HFNC to BiPAP.  10/21/22  PCCM consult for worsening hypoxic respiratory failure requiring BiPAP with poor tolerance. Ultimately requiring intubation. Post intubation, PEA arrest with ROSC after 6 minutes. CVC, Art line placed. LVEF 15-20%. Advanced heart failure consulted. RHC. IABP placement. ECMO consulted, deployment not indicated. Began having myoclonus, concern for myoclonic seizures--BZD and AEDs given and  neurology consulted.   10/22/22 AKI, Shock liver, persistent Afib, ongoing myoclonus without definitive sz on cEEG  10/23/22 concern for anoxic injury on cEEG, myoclonic seizure confirmed on cEEG x 5.5 minutes. Off milrinone. Continues on levophed and vasopressin.  Repeat CT Head without acute intracranial abnormality.  10/24/22  AKI worsening despite adequate UOP. NE and Vaso weaned, resultant low cardiac indices  10/25/22 worse myoclonus with propofol wean. Temperature dysregulation. Worse renal function and uremia. Improved CO; IABP removed. Arterial line replaced.   10/26/22 MRI brain without radiographic evidence of anoxic injury, but does reveal acute ischemic R cerebellar stroke. Propofol stopped. Family shares patient would NOT want long term trach/PEG   10/27/22 started on CRRT for azotemia  10/28/22 Persistent myoclonus with sedation hold. Transferring to [facility] per family request. There has been observed eye opening.   Exam at discharge:  General - critically ill middle aged M Neuro- Resists eye opening. Does not follow commands. Grimace to pain. Generalized myoclonus  HEENT- ETT secure. Clear oral secretions  Pulm- mechanically ventilated. CTAb   CV- sinus. 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)  Acute respiratory failure with hypoxia Possible aspiration PNA Acute renal failure with azotemia, without oliguria Transaminitis -- likely shock liver  Cardiogenic shock Acute on chronic HFrEF NICM  Afib Hypokalemia Hypocalcemia DM2  Anemia due to critical illness Thrombocytopenia, critical illness +/- residual consumptive component from IABP P -plan is to transfer to [facility] per family request for second opinion -sedation held for neuro prognostication  -seizure precautions, follow neuro exam closely  -Keppra  -Cont MV support -- family has communicated that pt would not want "long term trach" -- pending neuro prognostication, discussions re ?time limited trial of MV +/- revisit trach discussions (if felt that neuro status may improve with time, family might be open to trach) -completed abx  -has been on CRRT for metabolic clearance, continues to make urine -follow LFTs, coags PRN -follow metabolic panel closely, optimize lytes  -continue milrinone. Wean levo as hemodynamically appropriate. Weaning stress dose steroids  -follow coox & cardiac indices  -heparin gtt, amio gtt  -SSI + basal semglee 16units qD   Labs at discharge Lab Results  Component Value Date   CREATININE 1.86 (H) 10/28/2022   BUN 47 (H) 10/28/2022   NA 142 10/28/2022   K 3.8 10/28/2022   CL 108 10/28/2022   CO2 24 10/28/2022   Lab Results  Component Value Date   WBC 13.7 (H) 10/28/2022   HGB 11.3 (L) 10/28/2022   HCT 32.4 (L) 10/28/2022   MCV 85.5 10/28/2022   PLT 102 (L) 10/28/2022   Lab Results  Component Value Date   ALT 2,524 (H) 10/26/2022   AST 366 (H) 10/26/2022   ALKPHOS 124 10/26/2022   BILITOT 2.1 (H) 10/26/2022   No results found for: "INR", "PROTIME"  Current radiology studies DG CHEST PORT 1 VIEW  Result Date: 10/27/2022 CLINICAL DATA:  Central line placement EXAM: PORTABLE CHEST 1 VIEW COMPARISON:  10/25/2022 FINDINGS: Interval placement of right IJ approach  hemodialysis catheter with distal tip terminating at the level of the distal SVC. Endotracheal tube terminates approximately 3.9 cm above the carina. Enteric tube courses below the diaphragm with distal tip beyond the inferior margin of the film. Pulmonary arterial catheter has been removed. Left IJ sheath remains in place. Intra-aortic balloon pump has been removed. Left-sided implanted cardiac device. Stable heart size. Improved aeration of the left lung base. No new airspace consolidation. No pleural effusion or pneumothorax. IMPRESSION: 1. Interval placement of right IJ approach hemodialysis catheter with distal tip terminating at the level of the distal SVC. No pneumothorax. 2. Improved aeration of the left lung base. 3. Remaining lines and tubes, as above. Electronically Signed   By: Davina Poke D.O.   On: 10/27/2022 10:06      Allergies as of 10/28/2022   No Known Allergies      Medication List     STOP taking these medications    apixaban 5 MG Tabs tablet Commonly known as: ELIQUIS       TAKE these medications    amiodarone 360-4.14 MG/200ML-% Soln Commonly known as: NEXTERONE PREMIX Inject 30 mg/hr into the vein continuous.   clonazePAM 2 MG tablet Commonly known as: KLONOPIN Place 1 tablet (2 mg total) into feeding tube 3 (three) times daily.   docusate 50 MG/5ML liquid Commonly known as: COLACE Place 10 mLs (100 mg total) into feeding tube 2 (two) times daily.   fentaNYL 50 MCG/ML injection Commonly known as: SUBLIMAZE Inject 1-4 mLs (50-200 mcg total) into the vein every  30 (thirty) minutes as needed (to maintain RASS & CPOT goal.).   free water Soln Place 200 mLs into feeding tube every 4 (four) hours.   heparin 1000 unit/mL Soln injection 1-6 mLs (1,000-6,000 Units total) by CRRT route as needed (Use to fill CRRT catheter with heparin 1000 units/mL per catheter volume.).   heparin 25000 UT/250ML infusion Inject 1,300 Units/hr into the vein continuous.    hydrocortisone sodium succinate 100 MG injection Commonly known as: SOLU-CORTEF Inject 1 mL (50 mg total) into the vein daily. Start taking on: October 29, 2022   insulin aspart 100 UNIT/ML injection Commonly known as: novoLOG Inject 0-15 Units into the skin every 4 (four) hours.   insulin glargine-yfgn 100 UNIT/ML injection Commonly known as: SEMGLEE Inject 0.16 mLs (16 Units total) into the skin daily. Start taking on: October 29, 2022   levETIRAcetam 500 MG/100ML Soln Commonly known as: KEPRRA Inject 100 mLs (500 mg total) into the vein every 12 (twelve) hours.   milrinone 20 MG/100 ML Soln infusion Commonly known as: PRIMACOR Inject 0.0247 mg/min into the vein continuous.   norepinephrine 4-5 MG/250ML-% Soln Commonly known as: LEVOPHED Inject 0-40 mcg/min into the vein continuous.   pantoprazole 40 MG injection Commonly known as: PROTONIX Inject 40 mg into the vein daily. Start taking on: October 29, 2022   polyethylene glycol 17 g packet Commonly known as: MIRALAX / GLYCOLAX Place 17 g into feeding tube daily. Start taking on: October 29, 2022   senna-docusate 8.6-50 MG tablet Commonly known as: Senokot-S Take 1 tablet by mouth at bedtime as needed for mild constipation.         Discharged Condition: critical  Time spent on discharge greater than 40 minutes.  Vital signs at Discharge. Temp:  [97.6 F (36.4 C)-98.6 F (37 C)] 97.6 F (36.4 C) (12/01 1600) Pulse Rate:  [48-79] 79 (12/01 1600) Resp:  [14-20] 16 (12/01 1700) BP: (80-142)/(47-97) 111/82 (12/01 1700) SpO2:  [92 %-100 %] 98 % (12/01 1600) Arterial Line BP: (83-161)/(34-71) 138/58 (12/01 1700) FiO2 (%):  [40 %] 40 % (12/01 1600) Weight:  [94 kg] 94 kg (12/01 0400)  Eliseo Gum MSN, AGACNP-BC [place] for pager  10/28/2022, 5:04 PM

## 2022-10-28 NOTE — Progress Notes (Signed)
Per family had eyes open this morning and stuck tongue out.  BP (!) 88/62   Pulse (!) 54   Temp 98 F (36.7 C) (Oral)   Resp 17   Ht '6\' 2"'$  (1.88 m)   Wt 94 kg   SpO2 98%   BMI 26.61 kg/m  Off sedation, off NE. Remains on milrinone 0.25, amio 30  Continuous whole body myoclonus, less intense than this morning. Not opening eyes on my exam.  Protruded tongue on exam. Not following commands to open his eyes.  Not abe to give a thumbs up.  When transport time is known, EMTALA forms can be completed. Daughter at bedside during exam.   Additional cc time: 45 min.  Julian Hy, DO 10/28/22 4:27 PM [facility]

## 2022-10-28 NOTE — Progress Notes (Addendum)
Advanced Heart Failure Rounding Note   Subjective:    Seen at bedside with CCM this am. More myoclonus today. Holding sedation. Not making any purposeful movements.  CO-OX 71% on milrinone 0.25.  CVP 6.   CRRT for metabolic clearance to aid in neuroprognostication. Continues to make urine.    Objective:    Vital Signs:   Temp:  [97.8 F (36.6 C)-98.8 F (37.1 C)] 97.8 F (36.6 C) (12/01 0800) Pulse Rate:  [48-74] 74 (12/01 0804) Resp:  [14-21] 14 (12/01 1000) BP: (80-142)/(47-111) 94/68 (12/01 1000) SpO2:  [92 %-100 %] 99 % (12/01 1000) Arterial Line BP: (83-173)/(34-89) 110/50 (12/01 1000) FiO2 (%):  [40 %] 40 % (12/01 0941) Weight:  [94 kg] 94 kg (12/01 0400) Last BM Date : 10/28/22  Weight change: Filed Weights   10/26/22 0432 10/27/22 0500 10/28/22 0400  Weight: 94.9 kg 93.9 kg 94 kg    Intake/Output:   Intake/Output Summary (Last 24 hours) at 10/28/2022 1049 Last data filed at 10/28/2022 1000 Gross per 24 hour  Intake 4279.18 ml  Output 3064 ml  Net 1215.18 ml    PHYSICAL EXAM: General:  Critically ill appearing male lying in bed. Not responsive. HEENT: + ETT Neck: supple. JVP 8 cm. Carotids 2+ bilat; no bruits.  Cor: PMI nondisplaced. Regular rate & rhythm. No rubs, gallops or murmurs. Lungs: clear Abdomen: soft, nontender, nondistended.  Extremities: no cyanosis, clubbing, rash, trace edema Neuro: Off sedation. Not following commands. + myoclonus.    Telemetry: NSR 60s (personally reviewed)    Labs: Basic Metabolic Panel: Recent Labs  Lab 10/24/22 0446 10/24/22 1756 10/25/22 0457 10/26/22 0341 10/26/22 1821 10/27/22 0300 10/27/22 1116 10/27/22 1557 10/28/22 0313  NA 140   < > 143 140 147* 149* 152* 149* 144  K 3.4*   < > 3.1* 2.6* 2.7* 2.6* 2.6* 3.0* 2.8*  CL 88*   < > 95* 97* 102 101 106 106 106  CO2 25   < > '23 25 26 29 28 27 27  '$ GLUCOSE 130*   < > 187* 172* 163* 157* 176* 188* 156*  BUN 96*   < > 126* 139* 139* 146* 150* 117*  74*  CREATININE 6.29*   < > 6.54* 6.29* 5.71* 5.55* 5.26* 3.88* 2.55*  CALCIUM 8.5*   < > 8.2* 8.0* 8.2* 8.2* 7.7* 7.7* 7.5*  MG 2.5*  --  2.5* 2.5*  --  2.3  --   --  2.2  PHOS  --   --  9.0* 8.1*  --  6.7*  --  4.6 3.2   < > = values in this interval not displayed.    Liver Function Tests: Recent Labs  Lab 10/22/22 0217 10/23/22 1631 10/24/22 0446 10/26/22 0341 10/27/22 1557 10/28/22 0313  AST >10,000* 3,777* 2,247* 366*  --   --   ALT 5,030* 5,167* 4,555* 2,524*  --   --   ALKPHOS 94 119 104 124  --   --   BILITOT 2.6* 2.2* 2.2* 2.1*  --   --   PROT 5.8* 6.0* 5.9* 6.4*  --   --   ALBUMIN 2.6* 2.7* 2.6* 2.6* 2.4* 2.2*   No results for input(s): "LIPASE", "AMYLASE" in the last 168 hours. Recent Labs  Lab 10/28/22 0846  AMMONIA 30    CBC: Recent Labs  Lab 10/24/22 0446 10/25/22 0457 10/26/22 0341 10/27/22 0300 10/28/22 0313  WBC 26.4* 24.9* 22.6* 16.8* 13.7*  HGB 11.5* 11.9* 12.1* 11.8* 11.3*  HCT 32.8* 34.1* 34.2* 33.0* 32.4*  MCV 85.2 84.8 83.6 82.1 85.5  PLT 118* 125* 127* 133* 102*    Cardiac Enzymes: No results for input(s): "CKTOTAL", "CKMB", "CKMBINDEX", "TROPONINI" in the last 168 hours.  BNP: BNP (last 3 results) Recent Labs    10/20/22 1715  BNP 1,300.4*    ProBNP (last 3 results) No results for input(s): "PROBNP" in the last 8760 hours.    Other results:  Imaging: DG CHEST PORT 1 VIEW  Result Date: 10/27/2022 CLINICAL DATA:  Central line placement EXAM: PORTABLE CHEST 1 VIEW COMPARISON:  10/25/2022 FINDINGS: Interval placement of right IJ approach hemodialysis catheter with distal tip terminating at the level of the distal SVC. Endotracheal tube terminates approximately 3.9 cm above the carina. Enteric tube courses below the diaphragm with distal tip beyond the inferior margin of the film. Pulmonary arterial catheter has been removed. Left IJ sheath remains in place. Intra-aortic balloon pump has been removed. Left-sided implanted cardiac  device. Stable heart size. Improved aeration of the left lung base. No new airspace consolidation. No pleural effusion or pneumothorax. IMPRESSION: 1. Interval placement of right IJ approach hemodialysis catheter with distal tip terminating at the level of the distal SVC. No pneumothorax. 2. Improved aeration of the left lung base. 3. Remaining lines and tubes, as above. Electronically Signed   By: Davina Poke D.O.   On: 10/27/2022 10:06   DG Abd Portable 1V  Result Date: 10/26/2022 CLINICAL DATA:  Feeding tube placement EXAM: PORTABLE ABDOMEN - 1 VIEW COMPARISON:  None Available. FINDINGS: Feeding tube enters the stomach with the tip near the pylorus. Normal bowel gas pattern.  Dual lead pacemaker AICD noted. IMPRESSION: Feeding tube tip near the pylorus. Electronically Signed   By: Franchot Gallo M.D.   On: 10/26/2022 12:20     Medications:     Scheduled Medications:  Chlorhexidine Gluconate Cloth  6 each Topical Daily   clonazePAM  2 mg Per Tube TID   docusate  100 mg Per Tube BID   feeding supplement (PROSource TF20)  60 mL Per Tube TID   free water  200 mL Per Tube Q4H   hydrocortisone sod succinate (SOLU-CORTEF) inj  50 mg Intravenous Daily   insulin aspart  0-15 Units Subcutaneous Q4H   insulin glargine-yfgn  16 Units Subcutaneous Daily   multivitamin  1 tablet Per Tube QHS   mouth rinse  15 mL Mouth Rinse Q2H   pantoprazole (PROTONIX) IV  40 mg Intravenous Daily   polyethylene glycol  17 g Per Tube Daily    Infusions:   prismasol BGK 4/2.5 400 mL/hr at 10/28/22 0039    prismasol BGK 4/2.5 400 mL/hr at 10/28/22 0039   sodium chloride 10 mL/hr at 10/27/22 1712   sodium chloride Stopped (10/25/22 2239)   sodium chloride     amiodarone 30 mg/hr (10/28/22 1000)   feeding supplement (VITAL 1.5 CAL) 60 mL/hr at 10/28/22 1000   heparin 1,400 Units/hr (10/28/22 1000)   levETIRAcetam Stopped (10/28/22 0839)   milrinone 0.25 mcg/kg/min (10/28/22 1000)   prismasol BGK 4/2.5  1,800 mL/hr at 10/28/22 0805    PRN Medications: sodium chloride, Place/Maintain arterial line **AND** sodium chloride, acetaminophen **OR** acetaminophen, artificial tears, fentaNYL (SUBLIMAZE) injection, heparin, influenza vac split quadrivalent PF, LORazepam, mouth rinse, mouth rinse, pneumococcal 20-valent conjugate vaccine, senna-docusate   Assessment/Plan:   1. 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 EF 35% in 2020. S/p  Biotronik ICD - EF 35% -> 20% in setting of AF - Echo 10/21/22 EF 20% RV moderately reduced - S/p PEA arrest 11/23 - IABP out 11/28 - Co-ox stable on milrinone 0.25. Continue current support - CVP 6. Currently on CRRT for metabolic clearance. BUN and Scr now improving.   2. Suspected Anoxic brain injury - having seizures/myoclonus -> propofol/Keppra - Brain CT 10/21/22 - normal - Repeat CT 11/26, normal.   - Brain MRI 11/29 did not show any anoxic/hypoxic brain injury but remains unresponsive off sedation, + myoclonus.  - Concern for anoxic brain injury despite MRI findings. On CRRT for clearance to facilitate assessment of mental status. Family wants to give until Sunday, 12/3, to see if mental status improves. - Nephrology following  3. PEA arrest - 10/20/22 - CPR x 6 mins  4. PAF - likely cause of tachy CM - NSR this morning  - Continue heparin/IV amio  5. Non oliguric AKI - due to ATN/shock - SCr 0.8 -> peaked at 6.5. Cr 2.55 today. BUN trending down. - Continue hemodynamic support - Nephrology on board. Now on CRRT for clearance  6. Acute hypoxic respiratory failure - due to HF/cardiac arrest - remains intubated  7. Shock liver - improving continue supportive care  8. Hypocalcemia/hypokalemia - K 2.8 - KCl supp   9. H/o AML  - s/p chemo 2019  GOC: -Worry about anoxic brain injury as above. Family wants to keep off sedation and continue CRRT until Sunday to see if any improvement in  mental status. -Consider Palliative Care consult   Length of Stay: 8   [facility], LINDSAY N PA-C  10/28/2022, 10:49 AM  Advanced Heart Failure Team Pager [phone] (M-F; 7a - 4p)  Please contact [facility] for night-coverage after hours (4p -7a ) and weekends on [email or web address]  Agree with above.  Off NE. Remains on milrinone. Intubated. On CVVHD. Continues with severe myoclonus but may have some purposeful movements at times.  In NSR on IV amio.   General:  Intubated. Not following commands for me. +Myoclonus HEENT: normal + ETT Neck: supple. no JVD. Carotids 2+ bilat; no bruits. No lymphadenopathy or thryomegaly appreciated. Cor: PMI nondisplaced. Regular rate & rhythm. No rubs, gallops or murmurs. Lungs: clear Abdomen: soft, nontender, nondistended. No hepatosplenomegaly. No bruits or masses. Good bowel sounds. Extremities: no cyanosis, clubbing, rash, edema Neuro: intubated   Stable from cardiac standpoint on milrinone. On CVVH for clearance. Remains in NSR on IV amio. Continue to follow closely for neuro recovery.   Plan transfer to [facility]. This evening.   CRITICAL CARE Performed by: Glori Bickers  Total critical care time: 35 minutes  Critical care time was exclusive of separately billable procedures and treating other patients.  Critical care was necessary to treat or prevent imminent or life-threatening deterioration.  Critical care was time spent personally by me (independent of midlevel providers or residents) on the following activities: development of treatment plan with patient and/or surrogate as well as nursing, discussions with consultants, evaluation of patient's response to treatment, examination of patient, obtaining history from patient or surrogate, ordering and performing treatments and interventions, ordering and review of laboratory studies, ordering and review of radiographic studies, pulse oximetry and re-evaluation of patient's condition.  Glori Bickers, MD  6:47 PM

## 2022-10-28 NOTE — Progress Notes (Signed)
Patient's BP dropping to MAPs upper 50s, low 60s. Patient calm without myoclonic jerking at the moment, warming blanket in place for patient's comfort, and CRRT running net positively per orders. Clark MD notified. Order received to restart levophed. Once levophed bag spiked and hung, patient's BP recovered to MAPs in the 80s. Will restart as appropriate.

## 2022-10-28 NOTE — Progress Notes (Signed)
   10/28/22 1200  Clinical Encounter Type  Visited With Patient and family together  Visit Type Spiritual support;Initial  Referral From Nurse  Consult/Referral To [facility] called to speak with daughter Conception Oms) regarding HCPOA.  Patient is intubated and unable to make a decision at this time.  Chaplain advised daughter that we cannot execute unless patient is medically conscious to name their point of contact.  Chaplain provided emotional support and prayer for patient, daughter, and new family/wife and 5 children.   Lilian Kapur, Resident Chaplain [phone]

## 2022-10-28 NOTE — Progress Notes (Addendum)
Patient's family requested transfer to [facility]. They report that they have an accepting physician ready. Attempting to call transfer center now.  Julian Hy, DO 10/28/22 2:40 PM [facility]   Transfer center contacted- prelim information on the case provided. Waiting on return call from MICU team.  Julian Hy, DO 10/28/22 2:55 PM [facility]   Accepted by Dr. Levonne Lapping to MICU. [facility] has requested we set up transport- calling [facility] to request transport.  Julian Hy, DO 10/28/22 3:08 PM [facility]   Radiology working on power-sharing MRI brain, CT scans, most recent CXR from 11/30 with Normanna. D/c summary in progress.   Julian Hy, DO 10/28/22 3:41 PM [facility]

## 2022-10-28 NOTE — Progress Notes (Signed)
[facility] Progress Note  Name: Breydan Shillingburg MRN: [id number] DOB: 1956/07/29  Chief Complaint:  Shortness of breath   Subjective:  He was started on CRRT on 11/30 after tunneled catheter placement with critical care.  He had 3.1 liters UOP over 11/30 charted.  Per my order he had no UF over 11/30 with CRRT.  Spoke with his wife at bedside.  RN reports uop a little less overnight.   Review of systems:  Unable to obtain 2/2 intubated and obtunded  --------------- Background on consult:  Joaquin Knebel is a 66 y.o. male with a history of hypertension, CHF with reduced EF (EF 35% in 2020 per charting), s/p AICD, paroxysmal atrial fibrillation, and AML in remission who presented to the hospital on 11/23 with shortness of breath.  Found to have afib with RVR and was placed on high flow nasal cannula then BIPAP.   On 11/24 he experienced PEA arrest and required 6 minutes of CPR and epi.  Based on clinical exam with myoclonus, the team was concerned for anoxic brain injury however he had a recent MRI which did not suggest the same.  CHF, pulmonary, and neurology are following closely.  Nephrology is consulted for acute kidney injury.  His creatinine is 5.55 with recent peak of 6.48.  Bun is rising and up to 146.  He has not been waking up following his event.  There is concern that he truly does have anoxic injury however acute renal failure is clouding this picture at that time.  Critical care feels that volume is not an issue - he may be depleted.  His wife is his Advice worker and his family has been closely involved with his care.  His son is a Careers information officer at [employer].  I spoke with his son via phone and he is supportive of his dad starting dialysis.  I have called his wife x 3 and not reached his wife; she is not in the lobby or at bedside though was earlier today.  The phone number for his wife does not allow me to leave a voicemail message for her.  His son states that they  (the patient's wife and the patient's son) have discussed dialysis together with the cardiology and pulmonology teams this AM including the risks/benefits/indications.  Per nursing, critical care, and CHF team, his wife consented for the dialysis access line and is hoping to get started on dialysis soon.     Intake/Output Summary (Last 24 hours) at 10/28/2022 0653 Last data filed at 10/28/2022 0600 Gross per 24 hour  Intake 4231.59 ml  Output 3777 ml  Net 454.59 ml    Vitals:  Vitals:   10/28/22 0342 10/28/22 0400 10/28/22 0500 10/28/22 0600  BP:  126/68 105/75 102/86  Pulse:  67 61 61  Resp:  '14 18 18  '$ Temp:      TempSrc:      SpO2: 94% 97% 100% 100%  Weight:  94 kg    Height:         Physical Exam:  General: adult male in bed critically ill   HEENT: NCAT Neck: trachea midline; normal circumference Heart: S1S2 no rub Lungs: coarse mechanical breath sounds  Abdomen: soft/nd Extremities: no pitting edema lower extremities; no cyanosis or clubbing Neuro: myoclonus on my exam. No continuous sedation running  GU foley catheter with urine present  Access RIJ nontunneled dialysis catheter   Medications reviewed   Labs:     Latest Ref Rng & Units 10/28/2022  3:13 AM 10/27/2022    3:57 PM 10/27/2022   11:16 AM  BMP  Glucose 70 - 99 mg/dL 156  188  176   BUN 8 - 23 mg/dL 74  117  150   Creatinine 0.61 - 1.24 mg/dL 2.55  3.88  5.26   Sodium 135 - 145 mmol/L 144  149  152   Potassium 3.5 - 5.1 mmol/L 2.8  3.0  2.6   Chloride 98 - 111 mmol/L 106  106  106   CO2 22 - 32 mmol/L '27  27  28   '$ Calcium 8.9 - 10.3 mg/dL 7.5  7.7  7.7      Assessment/Plan:   # AKI  - Secondary to ischemic ATN - recently with post-ATN diuresis and is nonoliguric  - Continue CRRT.  4K fluids; zero UF (he's been nonoliguric)     # Cardiac Arrest - supportive care per cardiology and ICU teams    # Myoclonus  - There is concern for anoxic injury.  - Neurology is following the patient    #  Hypokalemia - he has been repleted this AM   - see that additional BMP is ordered for noon  - BID electrolytes for CRRT    # Hypernatremia - free water deficit   - Improved - enteric free water is ordered    # Hyperphosphatemia   - Secondary to AKI  - resolved with CRRT   # Transaminitis  - Secondary to ischemic insults - Improving   Disposition - intubated and on CRRT; continue ICU care  Claudia Desanctis, MD 10/28/2022 7:10 AM

## 2022-10-28 NOTE — Progress Notes (Signed)
ANTICOAGULATION CONSULT NOTE  Pharmacy Consult for heparin Indication:  atrial flutter  No Known Allergies  Patient Measurements: Height: '6\' 2"'$  (188 cm) Weight: 94 kg (207 lb 3.7 oz) IBW/kg (Calculated) : 82.2 Heparin Dosing Weight: 88 kg  Vital Signs: Temp: 98 F (36.7 C) (12/01 1200) Temp Source: Oral (12/01 1200) BP: 126/79 (12/01 1300) Pulse Rate: 54 (12/01 1300)  Labs: Recent Labs    10/26/22 0341 10/26/22 1821 10/27/22 0300 10/27/22 1116 10/27/22 1557 10/28/22 0313 10/28/22 1146  HGB 12.1*  --  11.8*  --   --  11.3*  --   HCT 34.2*  --  33.0*  --   --  32.4*  --   PLT 127*  --  133*  --   --  102*  --   APTT 55* 80*  --   --   --   --   --   HEPARINUNFRC 0.31 0.41 0.40  --   --  0.64 0.74*  CREATININE 6.29* 5.71* 5.55*   < > 3.88* 2.55* 1.95*   < > = values in this interval not displayed.     Estimated Creatinine Clearance: 44.5 mL/min (A) (by C-G formula based on SCr of 1.95 mg/dL (H)).   Medical History: Past Medical History:  Diagnosis Date   Hypertension    Leukemia ([place])    PAF (paroxysmal atrial fibrillation) ([place]) 04/27/2018    Medications:  Eliquis 5 mg BID PTA   Assessment: Brendan Ellis presented with increasing dyspnea on exertion for the past 2 weeks. Was found to be in atrial flutter with RVR on initial EKG. Due to worsening respiratory status, he was intubated overnight and started on vasopressor support. He went into PEA arrest and required 6 minutes of CPR and epi prior to ROSC. Given cardiogenic shock, he was taken to the cath lab this morning and an IABP was placed for further support. He has been taking Eliquis PTA for atrial flutter and last dose was 11/23 at 0900. Pharmacy has been consulted for heparin dosing.   IABP removed 11/28 and heparin was restarted. Pt with some oozing from HD line and heparin reduced. Repeat level is slightly above goal at 0.74.   Goal of Therapy:  Heparin level 0.3-0.7 units/ml aPTT 66-102  seconds Monitor platelets by anticoagulation protocol: Yes   Plan:  Reduce heparin further to 1300 units/h Recheck heparin level with daily labs  Arrie Senate, PharmD, BCPS, [facility] Clinical Pharmacist [phone] Please check AMION for all [facility] Pharmacy numbers 10/28/2022

## 2022-10-28 NOTE — Progress Notes (Addendum)
Report given to [facility] RN. Right IJ dressing bleeding and oozy, dressing changed and gauze placed underneath for hemostasis. IV's and gtts reviewed and organized. Patient connected to EMS monitor. Chart and transfer paperwork provided including Medical necessity for transfer form, EMTALA, discharge summary, transfer summary and chart printout. Transferred to EMS bed. Patient discharged at 2051
